# Patient Record
Sex: Male | Born: 1970
Health system: Southern US, Community
[De-identification: ages and names within clinical notes are randomized; demographics above are authoritative.]

## PROBLEM LIST (undated history)

## (undated) DIAGNOSIS — I10 Essential (primary) hypertension: Secondary | ICD-10-CM

## (undated) DIAGNOSIS — G4730 Sleep apnea, unspecified: Secondary | ICD-10-CM

## (undated) DIAGNOSIS — G43909 Migraine, unspecified, not intractable, without status migrainosus: Secondary | ICD-10-CM

## (undated) DIAGNOSIS — Z87442 Personal history of urinary calculi: Secondary | ICD-10-CM

## (undated) DIAGNOSIS — N201 Calculus of ureter: Secondary | ICD-10-CM

## (undated) DIAGNOSIS — N289 Disorder of kidney and ureter, unspecified: Secondary | ICD-10-CM

## (undated) HISTORY — PX: BACK SURGERY: SHX140

---

## 2012-06-16 DIAGNOSIS — Z87442 Personal history of urinary calculi: Secondary | ICD-10-CM | POA: Insufficient documentation

## 2014-02-08 ENCOUNTER — Emergency Department (HOSPITAL_COMMUNITY)
Admission: EM | Admit: 2014-02-08 | Discharge: 2014-02-08 | Disposition: A | Discharge status: Home / Self Care | Payer: 59 | Attending: Emergency Medicine | Admitting: Emergency Medicine

## 2014-02-08 ENCOUNTER — Encounter (HOSPITAL_COMMUNITY): Payer: Self-pay | Admitting: Emergency Medicine

## 2014-02-08 DIAGNOSIS — F17200 Nicotine dependence, unspecified, uncomplicated: Secondary | ICD-10-CM | POA: Insufficient documentation

## 2014-02-08 DIAGNOSIS — Z880 Allergy status to penicillin: Secondary | ICD-10-CM | POA: Insufficient documentation

## 2014-02-08 DIAGNOSIS — G43909 Migraine, unspecified, not intractable, without status migrainosus: Secondary | ICD-10-CM

## 2014-02-08 HISTORY — DX: Migraine, unspecified, not intractable, without status migrainosus: G43.909

## 2014-02-08 MED ORDER — KETOROLAC TROMETHAMINE 10 MG PO TABS: 10.0000 mg | ORAL_TABLET | Freq: Four times a day (QID) | ORAL | Status: DC | PRN

## 2014-02-08 MED ORDER — SODIUM CHLORIDE 0.9 % IV BOLUS (SEPSIS)
1000.0000 mL | Freq: Once | INTRAVENOUS | Status: AC
  Administered 2014-02-08: 1000 mL via INTRAVENOUS

## 2014-02-08 MED ORDER — DIPHENHYDRAMINE HCL 50 MG/ML IJ SOLN
25.0000 mg | Freq: Once | INTRAMUSCULAR | Status: AC
  Administered 2014-02-08: 25 mg via INTRAVENOUS
  Filled 2014-02-08: qty 1

## 2014-02-08 MED ORDER — METOCLOPRAMIDE HCL 10 MG PO TABS: 10.0000 mg | ORAL_TABLET | Freq: Four times a day (QID) | ORAL | Status: DC

## 2014-02-08 MED ORDER — KETOROLAC TROMETHAMINE 30 MG/ML IJ SOLN
30.0000 mg | Freq: Once | INTRAMUSCULAR | Status: AC
  Administered 2014-02-08: 30 mg via INTRAVENOUS
  Filled 2014-02-08: qty 1

## 2014-02-08 MED ORDER — METOCLOPRAMIDE HCL 5 MG/ML IJ SOLN
10.0000 mg | Freq: Once | INTRAMUSCULAR | Status: AC
  Administered 2014-02-08: 10 mg via INTRAVENOUS
  Filled 2014-02-08: qty 2

## 2014-02-08 NOTE — ED Notes (Signed)
Migraine headache starting yesterday with light/sound sensitivity.  Pt crying in triage.

## 2014-02-08 NOTE — ED Provider Notes (Signed)
CSN: [id number]     Arrival date & time 02/08/14  0902 History  This chart was scribed for Nat Christen, MD by Ludger Nutting, ED Scribe. This patient was seen in room APA06/APA06 and the patient's care was started 9:49 AM.    Chief Complaint  Patient presents with  . Migraine    The history is provided by the patient and a significant other. No language interpreter was used.    HPI Comments: John Jones is a 43 y.o. male with past medical history of migraine who presents to the Emergency Department complaining of a constant HA to the occipital region that began last night. Patient has a history of migraine HA's and states this episode is slightly worse than usual. He takes medications for these symptoms but is unable to specify what he takes. No fever, chills, meningeal signs, neuro deficits. Wife reports this is normal for him. Severity is moderate. His normal medication has not helped.   Past Medical History  Diagnosis Date  . Migraine    History reviewed. No pertinent past surgical history. No family history on file. History  Substance Use Topics  . Smoking status: Current Every Day Smoker    Types: Cigarettes  . Smokeless tobacco: Not on file  . Alcohol Use: No     Comment: occ    Review of Systems  A complete 10 system review of systems was obtained and all systems are negative except as noted in the HPI and PMH.    Allergies  Penicillins  Home Medications   Prior to Admission medications   Medication Sig Start Date End Date Taking? Authorizing Provider  ketorolac (TORADOL) 10 MG tablet Take 1 tablet (10 mg total) by mouth every 6 (six) hours as needed. 02/08/14   Nat Christen, MD  metoCLOPramide (REGLAN) 10 MG tablet Take 1 tablet (10 mg total) by mouth every 6 (six) hours. 02/08/14   Nat Christen, MD   BP 147/106  Pulse 88  Temp(Src) 98.3 F (36.8 C) (Oral)  Resp 16  Ht 5\' 5"  (1.651 m)  Wt 140 lb (63.504 kg)  BMI 23.30 kg/m2  SpO2 100% Physical Exam  Nursing  note and vitals reviewed. Constitutional: He is oriented to person, place, and time. He appears well-developed and well-nourished.  HENT:  Head: Normocephalic and atraumatic.  Eyes: Conjunctivae and EOM are normal. Pupils are equal, round, and reactive to light.  Neck: Normal range of motion. Neck supple.  Cardiovascular: Normal rate, regular rhythm and normal heart sounds.   Pulmonary/Chest: Effort normal and breath sounds normal.  Abdominal: Soft. Bowel sounds are normal.  Musculoskeletal: Normal range of motion.  Neurological: He is alert and oriented to person, place, and time.  Skin: Skin is warm and dry.  Psychiatric: He has a normal mood and affect. His behavior is normal.    ED Course  Procedures (including critical care time)  DIAGNOSTIC STUDIES: Oxygen Saturation is 100% on RA, normal by my interpretation.    COORDINATION OF CARE: 9:52 AM Patient was given IV Toradol, benadryl, and Reglan which is gradually improving his symptoms. Discussed treatment plan with pt at bedside and pt agreed to plan.   Labs Review Labs Reviewed - No data to display  Imaging Review No results found.   EKG Interpretation None      MDM   Final diagnoses:  Migraine without status migrainosus, not intractable, unspecified migraine type   Patient feels much better after IV Benadryl, Reglan, Toradol. No neurological deficits or  meningeal signs. Discharge medications Reglan 10 mg and Toradol 30 mg.  Discussed with patient and his wife.  I personally performed the services described in this documentation, which was scribed in my presence. The recorded information has been reviewed and is accurate.   Nat Christen, MD 02/08/14 1055

## 2014-02-08 NOTE — Discharge Instructions (Signed)
Increase fluids.   Prescriptions for Toradol and Reglan given.   Can also take Benadryl for your headache.   Return if worse.

## 2014-06-22 ENCOUNTER — Other Ambulatory Visit (HOSPITAL_COMMUNITY): Payer: Self-pay | Admitting: Orthopedic Surgery

## 2014-06-22 DIAGNOSIS — S63521A Sprain of radiocarpal joint of right wrist, initial encounter: Secondary | ICD-10-CM

## 2014-06-28 ENCOUNTER — Ambulatory Visit (HOSPITAL_COMMUNITY)
Admission: RE | Admit: 2014-06-28 | Discharge: 2014-06-28 | Disposition: A | Discharge status: Home / Self Care | Payer: 59 | Source: Ambulatory Visit | Attending: Orthopedic Surgery | Admitting: Orthopedic Surgery

## 2014-06-28 DIAGNOSIS — M25531 Pain in right wrist: Secondary | ICD-10-CM | POA: Insufficient documentation

## 2014-06-28 DIAGNOSIS — S63521A Sprain of radiocarpal joint of right wrist, initial encounter: Secondary | ICD-10-CM

## 2014-06-28 DIAGNOSIS — R937 Abnormal findings on diagnostic imaging of other parts of musculoskeletal system: Secondary | ICD-10-CM | POA: Diagnosis not present

## 2014-09-29 ENCOUNTER — Encounter (HOSPITAL_BASED_OUTPATIENT_CLINIC_OR_DEPARTMENT_OTHER): Admission: RE | Payer: Self-pay | Source: Ambulatory Visit

## 2014-09-29 ENCOUNTER — Ambulatory Visit (HOSPITAL_BASED_OUTPATIENT_CLINIC_OR_DEPARTMENT_OTHER): Admission: RE | Admit: 2014-09-29 | Payer: 59 | Source: Ambulatory Visit | Admitting: Orthopedic Surgery

## 2014-09-29 SURGERY — CARPAL TUNNEL RELEASE
Anesthesia: General | Site: Wrist | Laterality: Right

## 2014-12-07 ENCOUNTER — Encounter (HOSPITAL_COMMUNITY)
Admission: RE | Admit: 2014-12-07 | Discharge: 2014-12-07 | Disposition: A | Discharge status: Home / Self Care | Payer: 59 | Source: Ambulatory Visit | Attending: Orthopedic Surgery | Admitting: Orthopedic Surgery

## 2014-12-07 ENCOUNTER — Encounter (HOSPITAL_COMMUNITY): Payer: Self-pay

## 2014-12-07 DIAGNOSIS — M25531 Pain in right wrist: Secondary | ICD-10-CM | POA: Diagnosis present

## 2014-12-07 DIAGNOSIS — F419 Anxiety disorder, unspecified: Secondary | ICD-10-CM | POA: Diagnosis not present

## 2014-12-07 DIAGNOSIS — X58XXXA Exposure to other specified factors, initial encounter: Secondary | ICD-10-CM | POA: Diagnosis not present

## 2014-12-07 DIAGNOSIS — F329 Major depressive disorder, single episode, unspecified: Secondary | ICD-10-CM | POA: Diagnosis not present

## 2014-12-07 DIAGNOSIS — G5601 Carpal tunnel syndrome, right upper limb: Secondary | ICD-10-CM | POA: Diagnosis not present

## 2014-12-07 DIAGNOSIS — S63511A Sprain of carpal joint of right wrist, initial encounter: Secondary | ICD-10-CM | POA: Diagnosis not present

## 2014-12-07 DIAGNOSIS — F17210 Nicotine dependence, cigarettes, uncomplicated: Secondary | ICD-10-CM | POA: Diagnosis not present

## 2014-12-07 LAB — CBC
HEMATOCRIT: 48.5 % (ref 39.0–52.0)
HEMOGLOBIN: 16.7 g/dL (ref 13.0–17.0)
MCH: 32.6 pg (ref 26.0–34.0)
MCHC: 34.4 g/dL (ref 30.0–36.0)
MCV: 94.5 fL (ref 78.0–100.0)
Platelets: 215 10*3/uL (ref 150–400)
RBC: 5.13 MIL/uL (ref 4.22–5.81)
RDW: 13.6 % (ref 11.5–15.5)
WBC: 10.4 10*3/uL (ref 4.0–10.5)

## 2014-12-07 LAB — BASIC METABOLIC PANEL
ANION GAP: 8 (ref 5–15)
BUN: 12 mg/dL (ref 6–20)
CALCIUM: 9.1 mg/dL (ref 8.9–10.3)
CO2: 26 mmol/L (ref 22–32)
Chloride: 106 mmol/L (ref 101–111)
Creatinine, Ser: 1.13 mg/dL (ref 0.61–1.24)
Glucose, Bld: 93 mg/dL (ref 65–99)
Potassium: 4.1 mmol/L (ref 3.5–5.1)
SODIUM: 140 mmol/L (ref 135–145)

## 2014-12-07 MED ORDER — CLINDAMYCIN PHOSPHATE 900 MG/50ML IV SOLN
900.0000 mg | INTRAVENOUS | Status: AC
  Administered 2014-12-08: 900 mg via INTRAVENOUS
  Filled 2014-12-07 (×2): qty 50

## 2014-12-07 NOTE — H&P (Signed)
Ewing Fandino Narez is an 44 y.o. male.   Chief Complaint: RIGHT WRIST PAIN HPI: PT FOLLOWED IN OFFICE PT WITH PERSISTENT RIGHT WRIST PAIN PT HERE FOR SURGERY ON RIGHT WRIST NO PRIOR SURGERY TO RIGHT WRIST  Past Medical History  Diagnosis Date  . Migraine   . Depression   . Anxiety   . Chronic kidney disease     stones    No past surgical history on file.  No family history on file. Social History:  reports that he has been smoking Cigarettes.  He has a 8 pack-year smoking history. He does not have any smokeless tobacco history on file. He reports that he drinks alcohol. He reports that he does not use illicit drugs.  Allergies:  Allergies  Allergen Reactions  . Penicillins     No prescriptions prior to admission    Results for orders placed or performed during the hospital encounter of 12/07/14 (from the past 48 hour(s))  CBC     Status: None   Collection Time: 12/07/14 11:21 AM  Result Value Ref Range   WBC 10.4 4.0 - 10.5 K/uL   RBC 5.13 4.22 - 5.81 MIL/uL   Hemoglobin 16.7 13.0 - 17.0 g/dL   HCT 48.5 39.0 - 52.0 %   MCV 94.5 78.0 - 100.0 fL   MCH 32.6 26.0 - 34.0 pg   MCHC 34.4 30.0 - 36.0 g/dL   RDW 13.6 11.5 - 15.5 %   Platelets 215 150 - 400 K/uL  Basic metabolic panel     Status: None   Collection Time: 12/07/14 11:21 AM  Result Value Ref Range   Sodium 140 135 - 145 mmol/L   Potassium 4.1 3.5 - 5.1 mmol/L   Chloride 106 101 - 111 mmol/L   CO2 26 22 - 32 mmol/L   Glucose, Bld 93 65 - 99 mg/dL   BUN 12 6 - 20 mg/dL   Creatinine, Ser 1.13 0.61 - 1.24 mg/dL   Calcium 9.1 8.9 - 10.3 mg/dL   GFR calc non Af Amer >60 >60 mL/min   GFR calc Af Amer >60 >60 mL/min    Comment: (NOTE) The eGFR has been calculated using the CKD EPI equation. This calculation has not been validated in all clinical situations. eGFR's persistently <60 mL/min signify possible Chronic Kidney Disease.    Anion gap 8 5 - 15   No results found.  ROS NO RECENT ILLNESSES OR  HOSPITALIZATIONS  There were no vitals taken for this visit. Physical Exam   General Appearance:  Alert, cooperative, no distress, appears stated age  Head:  Normocephalic, without obvious abnormality, atraumatic  Eyes:  Pupils equal, conjunctiva/corneas clear,         Throat: Lips, mucosa, and tongue normal; teeth and gums normal  Neck: No visible masses     Lungs:   respirations unlabored  Chest Wall:  No tenderness or deformity  Heart:  Regular rate and rhythm,  Abdomen:   Soft, non-tender,         Extremities: RIGHT HAND: SKIN INTACT GOOD THUMB AND DIGITAL MOBILITY NVI RIGHT HAND ABLE TO PALMAR ABDUCT THUMB AND DIGITS  Pulses: 2+ and symmetric  Skin: Skin color, texture, turgor normal, no rashes or lesions     Neurologic: Normal    Assessment/Plan RIGHT HAND CARPAL TUNNEL RIGHT WRIST ULNAR SIDED WRIST PAIN  RIGHT HAND CARPAL TUNNEL RELEASE RIGHT WRIST ARTHROSCOPY AND JOINT DEBRIDEMENT AND REPAIR AS INDICATED  R/B/A DISCUSSED WITH PT IN OFFICE.  PT  VOICED UNDERSTANDING OF PLAN CONSENT SIGNED DAY OF SURGERY PT SEEN AND EXAMINED PRIOR TO OPERATIVE PROCEDURE/DAY OF SURGERY SITE MARKED. QUESTIONS ANSWERED WILL GO HOME FOLLOWING SURGERY  WE ARE PLANNING SURGERY FOR YOUR UPPER EXTREMITY. THE RISKS AND BENEFITS OF SURGERY INCLUDE BUT NOT LIMITED TO BLEEDING INFECTION, DAMAGE TO NEARBY NERVES ARTERIES TENDONS, FAILURE OF SURGERY TO ACCOMPLISH ITS INTENDED GOALS, PERSISTENT SYMPTOMS AND NEED FOR FURTHER SURGICAL INTERVENTION. WITH THIS IN MIND WE WILL PROCEED. I HAVE DISCUSSED WITH THE PATIENT THE PRE AND POSTOPERATIVE REGIMEN AND THE DOS AND DON'TS. PT VOICED UNDERSTANDING AND INFORMED CONSENT SIGNED.  Linna Hoff 12/08/2014 at 1611

## 2014-12-07 NOTE — Pre-Procedure Instructions (Signed)
    John Jones  12/07/2014      [facility] - [place], [place], [place] - [place] [address] Phone: [phone] Fax: [phone]    Your procedure is scheduled on 12/08/14.  Report to [facility] Admitting at to call  A.M.of surgery  Call this number if you have problems the morning of surgery:  [phone]   Remember:  Do not eat food or drink liquids after midnight.  Take these medicines the morning of surgery with A SIP OF WATER- cymbalta   Do not wear jewelry, make-up or nail polish.  Do not wear lotions, powders, or perfumes.  You may wear deodorant.  Do not shave 48 hours prior to surgery.  Men may shave face and neck.  Do not bring valuables to the hospital.  [facility] is not responsible for any belongings or valuables.  Contacts, dentures or bridgework may not be worn into surgery.  Leave your suitcase in the car.  After surgery it may be brought to your room.  For patients admitted to the hospital, discharge time will be determined by your treatment team.  Patients discharged the day of surgery will not be allowed to drive home.   Name and phone number of your driver:    Special instructions:    Please read over the following fact sheets that you were given. Pain Booklet, Coughing and Deep Breathing and Surgical Site Infection Prevention

## 2014-12-07 NOTE — Brief Op Note (Signed)
12/08/2014  6:36 PM  PATIENT:  John Jones  44 y.o. male  PRE-OPERATIVE DIAGNOSIS:  RIGHT CARPAL TUNNEL/RIGHT RADIAL CARPAL LIGAMENT SPRAIN  POST-OPERATIVE DIAGNOSIS:  * No post-op diagnosis entered *  PROCEDURE:  Procedure(s): RIGHT CARPAL TUNNEL RELEASE (Right) RIGHT WRIST ARTHROSCOPY WITH DEBRIDEMENT/OR REPAIR (Right)  SURGEON:  Surgeon(s) and Role:    * Iran Planas, MD - Primary  PHYSICIAN ASSISTANT:   ASSISTANTS: none   ANESTHESIA:   general  EBL:     BLOOD ADMINISTERED:none  DRAINS: none   LOCAL MEDICATIONS USED:  MARCAINE     SPECIMEN:  No Specimen  DISPOSITION OF SPECIMEN:  N/A  COUNTS:  YES  TOURNIQUET:  * No tourniquets in log *  DICTATION: .[id number]  PLAN OF CARE: Discharge to home after PACU  PATIENT DISPOSITION:  PACU - hemodynamically stable.   Delay start of Pharmacological VTE agent (>24hrs) due to surgical blood loss or risk of bleeding: not applicable

## 2014-12-08 ENCOUNTER — Ambulatory Visit (HOSPITAL_BASED_OUTPATIENT_CLINIC_OR_DEPARTMENT_OTHER)
Admission: RE | Admit: 2014-12-08 | Discharge: 2014-12-08 | Disposition: A | Discharge status: Home / Self Care | Payer: 59 | Source: Ambulatory Visit | Attending: Orthopedic Surgery | Admitting: Orthopedic Surgery

## 2014-12-08 ENCOUNTER — Ambulatory Visit (HOSPITAL_COMMUNITY): Payer: 59 | Admitting: Certified Registered"

## 2014-12-08 ENCOUNTER — Encounter (HOSPITAL_COMMUNITY): Payer: Self-pay | Admitting: *Deleted

## 2014-12-08 ENCOUNTER — Encounter (HOSPITAL_COMMUNITY)
Admission: RE | Disposition: A | Discharge status: Home / Self Care | Payer: Self-pay | Source: Ambulatory Visit | Attending: Orthopedic Surgery

## 2014-12-08 DIAGNOSIS — X58XXXA Exposure to other specified factors, initial encounter: Secondary | ICD-10-CM | POA: Insufficient documentation

## 2014-12-08 DIAGNOSIS — S63511A Sprain of carpal joint of right wrist, initial encounter: Secondary | ICD-10-CM | POA: Diagnosis not present

## 2014-12-08 DIAGNOSIS — F329 Major depressive disorder, single episode, unspecified: Secondary | ICD-10-CM | POA: Insufficient documentation

## 2014-12-08 DIAGNOSIS — G5601 Carpal tunnel syndrome, right upper limb: Secondary | ICD-10-CM | POA: Insufficient documentation

## 2014-12-08 DIAGNOSIS — F17210 Nicotine dependence, cigarettes, uncomplicated: Secondary | ICD-10-CM | POA: Insufficient documentation

## 2014-12-08 DIAGNOSIS — F419 Anxiety disorder, unspecified: Secondary | ICD-10-CM | POA: Insufficient documentation

## 2014-12-08 HISTORY — PX: WRIST ARTHROSCOPY: SHX838

## 2014-12-08 HISTORY — PX: CARPAL TUNNEL RELEASE: SHX101

## 2014-12-08 SURGERY — CARPAL TUNNEL RELEASE
Anesthesia: General | Site: Wrist | Laterality: Right

## 2014-12-08 MED ORDER — FENTANYL CITRATE (PF) 100 MCG/2ML IJ SOLN
INTRAMUSCULAR | Status: DC
Start: 2014-12-08 — End: 2014-12-08
  Filled 2014-12-08: qty 2

## 2014-12-08 MED ORDER — ONDANSETRON HCL 4 MG/2ML IJ SOLN
INTRAMUSCULAR | Status: AC
  Filled 2014-12-08: qty 2

## 2014-12-08 MED ORDER — KETOROLAC TROMETHAMINE 30 MG/ML IJ SOLN
INTRAMUSCULAR | Status: AC
  Administered 2014-12-08: 30 mg via INTRAVENOUS
  Filled 2014-12-08: qty 1

## 2014-12-08 MED ORDER — BUPIVACAINE HCL (PF) 0.25 % IJ SOLN
INTRAMUSCULAR | Status: DC | PRN
  Administered 2014-12-08: 10 mL

## 2014-12-08 MED ORDER — OXYCODONE-ACETAMINOPHEN 5-325 MG PO TABS
ORAL_TABLET | ORAL | Status: DC
Start: 2014-12-08 — End: 2014-12-09
  Filled 2014-12-08: qty 1

## 2014-12-08 MED ORDER — ONDANSETRON HCL 4 MG/2ML IJ SOLN
INTRAMUSCULAR | Status: DC | PRN
  Administered 2014-12-08: 4 mg via INTRAVENOUS

## 2014-12-08 MED ORDER — MIDAZOLAM HCL 5 MG/5ML IJ SOLN
INTRAMUSCULAR | Status: DC | PRN
  Administered 2014-12-08: 2 mg via INTRAVENOUS

## 2014-12-08 MED ORDER — FENTANYL CITRATE (PF) 250 MCG/5ML IJ SOLN
INTRAMUSCULAR | Status: AC
  Filled 2014-12-08: qty 5

## 2014-12-08 MED ORDER — FENTANYL CITRATE (PF) 100 MCG/2ML IJ SOLN: 25.0000 ug | INTRAMUSCULAR | Status: DC | PRN

## 2014-12-08 MED ORDER — KETOROLAC TROMETHAMINE 30 MG/ML IJ SOLN
30.0000 mg | Freq: Once | INTRAMUSCULAR | Status: AC | PRN
  Administered 2014-12-08: 30 mg via INTRAVENOUS

## 2014-12-08 MED ORDER — LIDOCAINE HCL (CARDIAC) 20 MG/ML IV SOLN
INTRAVENOUS | Status: DC | PRN
  Administered 2014-12-08: 100 mg via INTRAVENOUS

## 2014-12-08 MED ORDER — ONDANSETRON HCL 4 MG/2ML IJ SOLN
INTRAMUSCULAR | Status: AC
  Administered 2014-12-08: 4 mg
  Filled 2014-12-08: qty 2

## 2014-12-08 MED ORDER — LACTATED RINGERS IV SOLN
INTRAVENOUS | Status: DC
  Administered 2014-12-08 (×2): via INTRAVENOUS

## 2014-12-08 MED ORDER — HYDROMORPHONE HCL 1 MG/ML IJ SOLN
0.5000 mg | INTRAMUSCULAR | Status: AC | PRN
  Administered 2014-12-08 (×4): 0.5 mg via INTRAVENOUS

## 2014-12-08 MED ORDER — HYDROMORPHONE HCL 1 MG/ML IJ SOLN
INTRAMUSCULAR | Status: AC
  Administered 2014-12-08: 0.5 mg via INTRAVENOUS
  Filled 2014-12-08: qty 1

## 2014-12-08 MED ORDER — OXYCODONE-ACETAMINOPHEN 5-325 MG PO TABS
ORAL_TABLET | ORAL | Status: AC
  Filled 2014-12-08: qty 1

## 2014-12-08 MED ORDER — LIDOCAINE HCL (PF) 1 % IJ SOLN
INTRAMUSCULAR | Status: AC
  Filled 2014-12-08: qty 30

## 2014-12-08 MED ORDER — PROPOFOL 10 MG/ML IV BOLUS
INTRAVENOUS | Status: AC
  Filled 2014-12-08: qty 20

## 2014-12-08 MED ORDER — PROMETHAZINE HCL 25 MG/ML IJ SOLN: 6.2500 mg | INTRAMUSCULAR | Status: DC | PRN

## 2014-12-08 MED ORDER — BUPIVACAINE HCL (PF) 0.25 % IJ SOLN
INTRAMUSCULAR | Status: AC
  Filled 2014-12-08: qty 30

## 2014-12-08 MED ORDER — OXYCODONE-ACETAMINOPHEN 5-325 MG PO TABS
1.0000 | ORAL_TABLET | Freq: Once | ORAL | Status: AC
  Administered 2014-12-08: 1 via ORAL

## 2014-12-08 MED ORDER — MIDAZOLAM HCL 2 MG/2ML IJ SOLN
INTRAMUSCULAR | Status: AC
  Filled 2014-12-08: qty 2

## 2014-12-08 MED ORDER — HYDROMORPHONE HCL 1 MG/ML IJ SOLN
INTRAMUSCULAR | Status: AC
  Filled 2014-12-08: qty 1

## 2014-12-08 MED ORDER — OXYCODONE-ACETAMINOPHEN 5-325 MG PO TABS: 1.0000 | ORAL_TABLET | ORAL | Status: DC | PRN

## 2014-12-08 MED ORDER — DOCUSATE SODIUM 100 MG PO CAPS: 100.0000 mg | ORAL_CAPSULE | Freq: Two times a day (BID) | ORAL | Status: DC

## 2014-12-08 MED ORDER — SODIUM CHLORIDE 0.9 % IR SOLN
Status: DC | PRN
  Administered 2014-12-08 (×3): 1000 mL

## 2014-12-08 MED ORDER — PROPOFOL 10 MG/ML IV BOLUS
INTRAVENOUS | Status: DC | PRN
Start: 2014-12-08 — End: 2014-12-08
  Administered 2014-12-08: 100 mg via INTRAVENOUS
  Administered 2014-12-08: 200 mg via INTRAVENOUS

## 2014-12-08 MED ORDER — CHLORHEXIDINE GLUCONATE 4 % EX LIQD: 60.0000 mL | Freq: Once | CUTANEOUS | Status: DC

## 2014-12-08 MED ORDER — FENTANYL CITRATE (PF) 100 MCG/2ML IJ SOLN
INTRAMUSCULAR | Status: DC | PRN
  Administered 2014-12-08: 50 ug via INTRAVENOUS
  Administered 2014-12-08: 100 ug via INTRAVENOUS
  Administered 2014-12-08 (×2): 50 ug via INTRAVENOUS

## 2014-12-08 SURGICAL SUPPLY — 56 items
BANDAGE ELASTIC 3 VELCRO ST LF (GAUZE/BANDAGES/DRESSINGS) ×2 IMPLANT
BANDAGE ELASTIC 4 VELCRO ST LF (GAUZE/BANDAGES/DRESSINGS) ×2 IMPLANT
BNDG COHESIVE 3X5 TAN STRL LF (GAUZE/BANDAGES/DRESSINGS) ×2 IMPLANT
BNDG ESMARK 4X9 LF (GAUZE/BANDAGES/DRESSINGS) ×2 IMPLANT
BNDG GAUZE ELAST 4 BULKY (GAUZE/BANDAGES/DRESSINGS) ×2 IMPLANT
BRUSH SCRUB EZ PLAIN DRY (MISCELLANEOUS) ×2 IMPLANT
BUR GATOR 2.9 (BURR) IMPLANT
CORDS BIPOLAR (ELECTRODE) ×2 IMPLANT
COVER SURGICAL LIGHT HANDLE (MISCELLANEOUS) ×2 IMPLANT
CUFF TOURNIQUET SINGLE 18IN (TOURNIQUET CUFF) ×2 IMPLANT
CUFF TOURNIQUET SINGLE 24IN (TOURNIQUET CUFF) IMPLANT
DRAPE SURG 17X23 STRL (DRAPES) ×2 IMPLANT
DRSG ADAPTIC 3X8 NADH LF (GAUZE/BANDAGES/DRESSINGS) ×2 IMPLANT
EVACUATOR 1/8 PVC DRAIN (DRAIN) IMPLANT
GAUZE SPONGE 4X4 12PLY STRL (GAUZE/BANDAGES/DRESSINGS) ×2 IMPLANT
GAUZE XEROFORM 1X8 LF (GAUZE/BANDAGES/DRESSINGS) ×2 IMPLANT
GLOVE BIOGEL PI IND STRL 8.5 (GLOVE) ×1 IMPLANT
GLOVE BIOGEL PI INDICATOR 8.5 (GLOVE) ×1
GLOVE SURG ORTHO 8.0 STRL STRW (GLOVE) ×2 IMPLANT
GOWN STRL REUS W/ TWL LRG LVL3 (GOWN DISPOSABLE) ×2 IMPLANT
GOWN STRL REUS W/ TWL XL LVL3 (GOWN DISPOSABLE) ×1 IMPLANT
GOWN STRL REUS W/TWL LRG LVL3 (GOWN DISPOSABLE) ×2
GOWN STRL REUS W/TWL XL LVL3 (GOWN DISPOSABLE) ×1
KIT BASIN OR (CUSTOM PROCEDURE TRAY) ×2 IMPLANT
KIT ROOM TURNOVER OR (KITS) ×2 IMPLANT
LOOP VESSEL MAXI BLUE (MISCELLANEOUS) IMPLANT
MANIFOLD NEPTUNE II (INSTRUMENTS) ×2 IMPLANT
NEEDLE HYPO 21X1.5 SAFETY (NEEDLE) ×2 IMPLANT
NEEDLE HYPO 25GX1X1/2 BEV (NEEDLE) ×2 IMPLANT
NS IRRIG 1000ML POUR BTL (IV SOLUTION) ×2 IMPLANT
PACK ORTHO EXTREMITY (CUSTOM PROCEDURE TRAY) ×2 IMPLANT
PAD ARMBOARD 7.5X6 YLW CONV (MISCELLANEOUS) ×4 IMPLANT
PAD CAST 4YDX4 CTTN HI CHSV (CAST SUPPLIES) ×2 IMPLANT
PADDING CAST ABS 4INX4YD NS (CAST SUPPLIES) ×1
PADDING CAST ABS COTTON 4X4 ST (CAST SUPPLIES) ×1 IMPLANT
PADDING CAST COTTON 4X4 STRL (CAST SUPPLIES) ×2
SET SM JOINT TUBING/CANN (CANNULA) ×2 IMPLANT
SOAP 2 % CHG 4 OZ (WOUND CARE) ×2 IMPLANT
SPLINT FIBERGLASS 3X35 (CAST SUPPLIES) ×2 IMPLANT
SUCTION FRAZIER TIP 10 FR DISP (SUCTIONS) ×2 IMPLANT
SUT ETHILON 4 0 PS 2 18 (SUTURE) IMPLANT
SUT PROLENE 4 0 PS 2 18 (SUTURE) ×2 IMPLANT
SUT PROLENE 5 0 PS 2 (SUTURE) ×2 IMPLANT
SUT VIC AB 2-0 CT1 27 (SUTURE)
SUT VIC AB 2-0 CT1 TAPERPNT 27 (SUTURE) IMPLANT
SUT VIC AB 3-0 FS2 27 (SUTURE) IMPLANT
SYR CONTROL 10ML LL (SYRINGE) ×2 IMPLANT
SYSTEM CHEST DRAIN TLS 7FR (DRAIN) IMPLANT
TOWEL OR 17X24 6PK STRL BLUE (TOWEL DISPOSABLE) ×2 IMPLANT
TOWEL OR 17X26 10 PK STRL BLUE (TOWEL DISPOSABLE) ×2 IMPLANT
TRAP DIGIT (INSTRUMENTS) ×2 IMPLANT
TUBE CONNECTING 12X1/4 (SUCTIONS) ×2 IMPLANT
TUBING CYSTO DISP (UROLOGICAL SUPPLIES) ×2 IMPLANT
UNDERPAD 30X30 INCONTINENT (UNDERPADS AND DIAPERS) ×2 IMPLANT
WAND SHORT BEVEL W/CORD (SURGICAL WAND) ×2 IMPLANT
WATER STERILE IRR 1000ML POUR (IV SOLUTION) ×2 IMPLANT

## 2014-12-08 NOTE — Discharge Instructions (Signed)
KEEP BANDAGE CLEAN AND DRY °CALL OFFICE FOR F/U APPT [phone] IN 14 DAYS °DR Robb Sibal CELL [phone] °KEEP HAND ELEVATED ABOVE HEART °OK TO APPLY ICE TO OPERATIVE AREA °CONTACT OFFICE IF ANY WORSENING PAIN OR CONCERNS. °

## 2014-12-08 NOTE — Anesthesia Preprocedure Evaluation (Signed)
Anesthesia Evaluation  Patient identified by MRN, date of birth, ID band Patient awake    Reviewed: Allergy & Precautions, NPO status , Patient's Chart, lab work & pertinent test results  Airway Mallampati: II  TM Distance: >3 FB Neck ROM: Full    Dental no notable dental hx.    Pulmonary Current Smoker,  breath sounds clear to auscultation  Pulmonary exam normal       Cardiovascular negative cardio ROS Normal cardiovascular examRhythm:Regular Rate:Normal     Neuro/Psych Depression negative neurological ROS     GI/Hepatic negative GI ROS, Neg liver ROS,   Endo/Other  negative endocrine ROS  Renal/GU negative Renal ROS  negative genitourinary   Musculoskeletal negative musculoskeletal ROS (+)   Abdominal   Peds negative pediatric ROS (+)  Hematology negative hematology ROS (+)   Anesthesia Other Findings   Reproductive/Obstetrics negative OB ROS                             Anesthesia Physical Anesthesia Plan  ASA: II  Anesthesia Plan: General   Post-op Pain Management:    Induction: Intravenous  Airway Management Planned: LMA  Additional Equipment:   Intra-op Plan:   Post-operative Plan: Extubation in OR  Informed Consent: I have reviewed the patients History and Physical, chart, labs and discussed the procedure including the risks, benefits and alternatives for the proposed anesthesia with the patient or authorized representative who has indicated his/her understanding and acceptance.   Dental advisory given  Plan Discussed with: CRNA and Surgeon  Anesthesia Plan Comments:         Anesthesia Quick Evaluation

## 2014-12-08 NOTE — Anesthesia Postprocedure Evaluation (Signed)
  Anesthesia Post-op Note  Patient: John Jones  Procedure(s) Performed: Procedure(s): RIGHT CARPAL TUNNEL RELEASE (Right) RIGHT WRIST ARTHROSCOPY WITH DEBRIDEMENT/OR REPAIR (Right)  Patient Location: PACU  Anesthesia Type:General  Level of Consciousness: awake, alert  and oriented  Airway and Oxygen Therapy: Patient Spontanous Breathing and Patient connected to nasal cannula oxygen  Post-op Pain: mild  Post-op Assessment: Post-op Vital signs reviewed, Patient's Cardiovascular Status Stable, Respiratory Function Stable, Patent Airway and Pain level controlled              Post-op Vital Signs: stable  Last Vitals:  Filed Vitals:   12/08/14 2030  BP: 157/112  Pulse: 97  Temp:   Resp: 19    Complications: No apparent anesthesia complications

## 2014-12-08 NOTE — Anesthesia Procedure Notes (Signed)
Procedure Name: Intubation Date/Time: 12/08/2014 5:54 PM Performed by: Melina Copa, Shanaya Schneck R Pre-anesthesia Checklist: Patient identified, Emergency Drugs available, Suction available, Patient being monitored and Timeout performed Patient Re-evaluated:Patient Re-evaluated prior to inductionOxygen Delivery Method: Circle system utilized Preoxygenation: Pre-oxygenation with 100% oxygen Intubation Type: IV induction Ventilation: Mask ventilation without difficulty LMA: LMA inserted LMA Size: 4.0 Number of attempts: 1 Placement Confirmation: positive ETCO2 and breath sounds checked- equal and bilateral Tube secured with: Tape Dental Injury: Teeth and Oropharynx as per pre-operative assessment

## 2014-12-08 NOTE — Transfer of Care (Signed)
Immediate Anesthesia Transfer of Care Note  Patient: John Jones  Procedure(s) Performed: Procedure(s): RIGHT CARPAL TUNNEL RELEASE (Right) RIGHT WRIST ARTHROSCOPY WITH DEBRIDEMENT/OR REPAIR (Right)  Patient Location: PACU  Anesthesia Type:General  Level of Consciousness: awake, alert  and oriented  Airway & Oxygen Therapy: Patient Spontanous Breathing and Patient connected to nasal cannula oxygen  Post-op Assessment: Report given to RN and Post -op Vital signs reviewed and stable  Post vital signs: Reviewed and stable  Last Vitals:  Filed Vitals:   12/08/14 1539  BP: 169/107  Pulse: 82  Temp: 36.7 C  Resp: 18    Complications: No apparent anesthesia complications

## 2014-12-09 ENCOUNTER — Encounter (HOSPITAL_COMMUNITY): Payer: Self-pay | Admitting: Orthopedic Surgery

## 2014-12-09 NOTE — Op Note (Signed)
NAME:  John Jones, John Jones NO.:  [id number]  MEDICAL RECORD NO.:  [id number]  LOCATION:  [facility]                         FACILITY:  [facility]  PHYSICIAN:  Linna Hoff IV, M.D.DATE OF BIRTH:  February 02, 1971  DATE OF PROCEDURE:  12/08/2014 DATE OF DISCHARGE:  12/08/2014                              OPERATIVE REPORT   PREOPERATIVE DIAGNOSES: 1. Right hand carpal tunnel syndrome. 2. Right wrist, dorsal wrist pain.  POSTOPERATIVE DIAGNOSES: 1. Right hand carpal tunnel syndrome. 2. Right wrist scapholunate ligament tear.  ATTENDING PHYSICIAN:  Linna Hoff, M.D., who scrubbed and present for the entire procedure.  ASSISTANT SURGEON:  None.  ANESTHESIA:  General via LMA.  SURGICAL PROCEDURE: 1. Right hand carpal tunnel release. 2. Right wrist diagnostic arthroscopy with debridement for SL ligament     instability.  INTRAOPERATIVE FINDINGS:  The patient did have the grade II, impinging upon grade III tear to the SL ligament.  I was able to pass the probe volarly and almost into the midcarpal joint.  The patient still had an incomplete tear of the SL ligament.  The volar band disrupted to the dorsal band was still in continuity.  I could not pass the arthroscope into the midcarpal joint.  SURGICAL INDICATIONS:  Mr. Hinch is a right-hand-dominant gentleman with persistent pain in his wrist.  The patient failed nonsurgical treatment and elected to undergo the above procedures.  Risks, benefits, and alternatives were discussed in detail with the patient.  A signed informed consent was obtained.  Risks include, but not limited to bleeding, infection, damage to nearby nerves, arteries or tendons; loss of motion of the wrist and digits, incomplete relief of symptoms, and need for further surgical intervention.  DESCRIPTION OF PROCEDURE:  The patient was properly identified in the preoperative holding area and marked with a permanent marker made on the right wrist to  indicate the correct operative site.  The patient was then brought back to the operating room, placed supine on the anesthesia room table, where the general anesthesia was administered.  The patient tolerated this well.  A well-padded tourniquet was then placed in a right brachium and sealed with 1000 drape.  Right upper extremity was then prepped and draped in normal sterile fashion.  Time-out was called, the correct site was identified, and procedure then begun.  Attention was then turned to the right wrist.  The limb was then elevated using Esmarch exsanguination and tourniquet insufflated.  Several centimeter incision made directly in the mid palm.  Dissection was then carried down through the skin and subcutaneous tissue.  The palmar fascia was incised longitudinally, exposing the transverse carpal ligament.  Under direct visualization, the ulnar border of the transverse carpal ligament, this was then released with a 15 blade distally.  Further exposure was then carried out proximally to the remaining portions of the transverse carpal ligament as well as portion of the antebrachial fascia was then released.  The wound was then irrigated.  The skin was then closed using contents of the carpal canal and inspected.  No other abnormalities noted.  Skin was then closed using horizontal mattress and Prolene sutures.  Attention was then turned to the right wrist and arthroscopy.  The wrist was then placed on the wrist arthroscopy tower. Approximately, 5 - 10 pounds countertraction was then applied. Anatomical portal sites were then marked out and the joint was insufflated with 10 mL of saline.  There was a leakage of the saline throughout the midcarpal joint.  The 3-4 working portal was then established and a blunt dissection carried down into the joint, where the instrumentation was introduced into the wrist.  Right diagnostic wrist arthroscopy was then carried out.  The patient has  the radioscaphoid capitate and a radioscaphoid articulation looked good. This was free of any arthrosis.  The short long radiolunate ligaments were in continuity.  The patient did have disruption of the volar band of the SL ligament.  I did have the dorsal band still in continuity. Further exposure was then carried out.  Ulnarly, a 60 working portal was then established.  Arthroscopic probe was then introduced and then probing of the TFCC did not reveal any tears, ulnarly and dorsally and volarly including the peripheral margins.  Once this was carried out, the instrumentation was then reversed, inspection, and the LT ligament revealed some mild fraying, but the LT ligament was noted to be in continuity.  The probe was then placed and direct inspection was then done on the SL ligament and thorough inspection done on the SL ligament, and I was not able to place the probe up into the midcarpal joint.  The patient did have the widening of the SL interval, again with the dorsal band in continuity.  I debrided the small suction.  [place] was then brought in to remove some of the degenerative tearing of the volar band of the SL ligament.  The ArthroCare wand was then used for thermal or thermal shrinkage, using it in the coag setting and the lowest setting. After this was done, attention was then turned to the midcarpal joint. The 2 mid carpal portals were then placed and dorsally inspection of the midcarpal joint was done again and I am going to place the probe into the radiocarpal joint.  I did review some mild diastasis through the midcarpal joint.  LT ligament was noted to be in continuity.  No other abnormalities noted within the midcarpal joint.  Instrumentation was then removed.  Once again, a final inspection of the radiocarpal joint was then carried out.  The thorough lavage of the joint was then carried out.  The portal sites were then closed with Prolene 10 mL of 0.25% Marcaine  infiltrated locally between the 2 incisions.  Dorsally and volarly.  Xeroform dressing, sterile compressive bandage was applied. The patient tolerated the procedure well, was placed in a short-arm volar splint, extubated, and taken to recovery room in good condition.  POSTOPERATIVE PLAN:  The patient will be discharged to home, seen back in the office in approximately 2 weeks for wound check, volarly and dorsally.  Application of a short-arm cast.  Cast immobilization for a total of 4 weeks.  Then we will begin a regimen, likely therapy, try to get back some gentle mobility.  PROGNOSIS:  Guarded.  The patient does have the partial SL ligament tear, these are very challenging injuries to treat.  The patient will likely have some persistent dorsal wrist pain with extension.  Based on the intraoperative findings, we would not recommend reconstruction of the SL ligament with the tendon graft, but the patient may still have some persistent pain in the wrist from the partial SL ligament tear.     Josph Macho  Christain Sacramento, M.D.     FWO/MEDQ  D:  12/08/2014  T:  12/09/2014  Job:  [id number]

## 2014-12-13 NOTE — Addendum Note (Signed)
Addendum  created 12/13/14 1934 by Myrtie Soman, MD   Modules edited: Anesthesia Responsible Staff

## 2015-12-02 HISTORY — PX: CYSTOSCOPY: SUR368

## 2016-01-18 ENCOUNTER — Encounter: Payer: Self-pay | Admitting: Orthopedic Surgery

## 2016-01-18 ENCOUNTER — Ambulatory Visit: Payer: Commercial Managed Care - HMO

## 2016-01-18 ENCOUNTER — Ambulatory Visit (INDEPENDENT_AMBULATORY_CARE_PROVIDER_SITE_OTHER): Payer: Commercial Managed Care - HMO | Admitting: Orthopedic Surgery

## 2016-01-18 ENCOUNTER — Ambulatory Visit (INDEPENDENT_AMBULATORY_CARE_PROVIDER_SITE_OTHER): Payer: Commercial Managed Care - HMO

## 2016-01-18 VITALS — BP 135/84 | HR 112 | Ht 65.0 in | Wt 176.0 lb

## 2016-01-18 DIAGNOSIS — M79644 Pain in right finger(s): Secondary | ICD-10-CM

## 2016-01-18 DIAGNOSIS — L02511 Cutaneous abscess of right hand: Secondary | ICD-10-CM

## 2016-01-18 MED ORDER — OXYCODONE-ACETAMINOPHEN 5-325 MG PO TABS: 1.0000 | ORAL_TABLET | ORAL | 0 refills | Status: DC | PRN

## 2016-01-18 NOTE — Patient Instructions (Signed)
SURGERY TOMORROW 8/18  OUT OF WORK 14 DAYS STARTING TOMORROW

## 2016-01-18 NOTE — Progress Notes (Signed)
Chief Complaint  Patient presents with  . 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 He thinks he may have gotten some metal shavings in the long finger.  Today complaining of constant sharp throbbing burning stabbing aching pain and swelling over the middle phalanx with slight decreased range of motion but no fusiform swelling. He took Advil, Aleve and ibuprofen and it didn't help  Review of Systems  Constitutional: Negative.   Musculoskeletal: Positive for back pain, joint pain and myalgias.  Endo/Heme/Allergies: Positive for environmental allergies.  All other systems reviewed and are negative.   Past Medical History:  Diagnosis Date  . Anxiety   . Chronic kidney disease    stones  . Depression   . Migraine     Past Surgical History:  Procedure Laterality Date  . CARPAL TUNNEL RELEASE Right 12/08/2014   Procedure: RIGHT CARPAL TUNNEL RELEASE;  Surgeon: Iran Planas, MD;  Location: [place];  Service: Orthopedics;  Laterality: Right;  . WRIST ARTHROSCOPY Right 12/08/2014   Procedure: RIGHT WRIST ARTHROSCOPY WITH DEBRIDEMENT/OR REPAIR;  Surgeon: Iran Planas, MD;  Location: [facility];  Service: Orthopedics;  Laterality: Right;   No family history on file. Social History  Substance Use Topics  . Smoking status: Current Every Day Smoker    Packs/day: 1.00    Years: 8.00    Types: Cigarettes  . Smokeless tobacco: Never Used  . Alcohol use Yes     Comment: occ    Current Outpatient Prescriptions:  .  Aspirin-Acetaminophen-Caffeine (GOODY HEADACHE PO), Take 1 Package by mouth daily as needed (headache/pain)., Disp: , Rfl:  .  docusate sodium (COLACE) 100 MG capsule, Take 1 capsule (100 mg total) by mouth 2 (two) times daily., Disp: 30 capsule, Rfl: 0 .  DULoxetine (CYMBALTA) 30 MG capsule, Take 30 mg  by mouth daily., Disp: , Rfl: 2 .  oxyCODONE-acetaminophen (ROXICET) 5-325 MG per tablet, Take 1 tablet by mouth every 4 (four) hours as needed for severe pain., Disp: 45 tablet, Rfl: 0 .  RELPAX 40 MG tablet, Take 40 mg by mouth as needed for migraine. , Disp: , Rfl: 2 .  zolpidem (AMBIEN) 10 MG tablet, Take 10 mg by mouth at bedtime as needed for sleep. , Disp: , Rfl: 0  BP 135/84   Pulse (!) 112   Ht 5\' 5"  (1.651 m)   Wt 176 lb (79.8 kg)   BMI 29.29 kg/m   Physical Exam  Constitutional: He is oriented to person, place, and time. He appears well-developed and well-nourished. No distress.  Cardiovascular: Normal rate and intact distal pulses.   Neurological: He is alert and oriented to person, place, and time.  Skin: Skin is warm and dry. Capillary refill takes less than 2 seconds. No rash noted. He is not diaphoretic. No erythema. No pallor.  Psychiatric: He has a normal mood and affect. His behavior is normal. Judgment and thought content normal.    Left Hand Exam   Tenderness  The patient is experiencing no tenderness.     Range of Motion  The patient has normal left wrist ROM.  Muscle Strength  The patient has normal left wrist strength.  Other  Erythema: absent Sensation: normal Pulse: present     The right hand is discolored bilaterally with what looks  to be oral/grease from chronic and posterior work. He has a swollen area over the middle phalanx with no erythema but extreme tenderness and definite swelling versus opposite finger with point tenderness no erythema axillary lymph nodes are negative finger flexion is decreased by 50%. The index finger has a small area of nodularity with no tenderness or swelling    ASSESSMENT: My personal interpretation of the images:  2 views one of the index finger 3 views and one of the long finger 3 views no foreign body   Probably some type of granuloma  Encounter Diagnoses  Name Primary?  . Finger pain, right Yes  .  Abscess of finger, right      Recommend incision drainage and culture  He will need to be out of work 2 weeks after the procedure PLAN Arther Abbott, MD 01/18/2016 2:53 PM

## 2016-01-18 NOTE — H&P (Addendum)
Chief Complaint  Patient presents with  . 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 He thinks he may have gotten some metal shavings in the long finger.   Today complaining of constant sharp throbbing burning stabbing aching pain and swelling over the middle phalanx with slight decreased range of motion but no fusiform swelling. He took Advil, Aleve and ibuprofen and it didn't help   Review of Systems  Constitutional: Negative.   Musculoskeletal: Positive for back pain, joint pain and myalgias.  Endo/Heme/Allergies: Positive for environmental allergies.  All other systems reviewed and are negative.         Past Medical History:  Diagnosis Date  . Anxiety    . Chronic kidney disease      stones  . Depression    . Migraine             Past Surgical History:  Procedure Laterality Date  . CARPAL TUNNEL RELEASE Right 12/08/2014    Procedure: RIGHT CARPAL TUNNEL RELEASE;  Surgeon: Iran Planas, MD;  Location: [place];  Service: Orthopedics;  Laterality: Right;  . WRIST ARTHROSCOPY Right 12/08/2014    Procedure: RIGHT WRIST ARTHROSCOPY WITH DEBRIDEMENT/OR REPAIR;  Surgeon: Iran Planas, MD;  Location: [place];  Service: Orthopedics;  Laterality: Right;    No family history on file.        Social History  Substance Use Topics  . Smoking status: Current Every Day Smoker      Packs/day: 1.00      Years: 8.00      Types: Cigarettes  . Smokeless tobacco: Never Used  . Alcohol use Yes         Comment: occ      Current Outpatient Prescriptions:  .  Aspirin-Acetaminophen-Caffeine (GOODY HEADACHE PO), Take 1 Package by mouth daily as needed (headache/pain)., Disp: , Rfl:  .  docusate sodium (COLACE) 100 MG capsule, Take 1 capsule (100 mg total) by mouth 2 (two) times daily., Disp: 30 capsule, Rfl: 0 .   DULoxetine (CYMBALTA) 30 MG capsule, Take 30 mg by mouth daily., Disp: , Rfl: 2 .  oxyCODONE-acetaminophen (ROXICET) 5-325 MG per tablet, Take 1 tablet by mouth every 4 (four) hours as needed for severe pain., Disp: 45 tablet, Rfl: 0 .  RELPAX 40 MG tablet, Take 40 mg by mouth as needed for migraine. , Disp: , Rfl: 2 .  zolpidem (AMBIEN) 10 MG tablet, Take 10 mg by mouth at bedtime as needed for sleep. , Disp: , Rfl: 0   BP 135/84   Pulse (!) 112   Ht 5\' 5"  (1.651 m)   Wt 176 lb (79.8 kg)   BMI 29.29 kg/m    Physical Exam  Constitutional: He is oriented to person, place, and time. He appears well-developed and well-nourished. No distress.  Cardiovascular: Normal rate and intact distal pulses.   Neurological: He is alert and oriented to person, place, and time.  Skin: Skin is warm and dry. Capillary refill takes less than 2 seconds. No rash noted. He is not diaphoretic. No erythema. No pallor.  Psychiatric: He has a normal mood and affect. His behavior is normal. Judgment and thought content normal.      Left Hand Exam    Tenderness  The patient is experiencing  no tenderness.        Range of Motion  The patient has normal left wrist ROM.   Muscle Strength  The patient has normal left wrist strength.   Other  Erythema: absent Sensation: normal Pulse: present       The right hand is discolored bilaterally with what looks to be oral/grease from chronic and posterior work. He has a swollen area over the middle phalanx with no erythema but extreme tenderness and definite swelling versus opposite finger with point tenderness no erythema axillary lymph nodes are negative finger flexion is decreased by 50%. The index finger has a small area of nodularity with no tenderness or swelling       ASSESSMENT: My personal interpretation of the images:  2 views one of the index finger 3 views and one of the long finger 3 views no foreign body     Probably some type of granuloma        Encounter Diagnoses  Name Primary?  . Finger pain, right Yes  . Abscess of finger, right          Recommend incision drainage and culture right long finger   He will need to be out of work 2 weeks after the procedure

## 2016-01-19 ENCOUNTER — Ambulatory Visit (HOSPITAL_COMMUNITY)
Admission: RE | Admit: 2016-01-19 | Discharge: 2016-01-19 | Disposition: A | Discharge status: Home / Self Care | Payer: Commercial Managed Care - HMO | Source: Ambulatory Visit | Attending: Orthopedic Surgery | Admitting: Orthopedic Surgery

## 2016-01-19 ENCOUNTER — Ambulatory Visit (HOSPITAL_COMMUNITY): Payer: Commercial Managed Care - HMO | Admitting: Anesthesiology

## 2016-01-19 ENCOUNTER — Encounter (HOSPITAL_COMMUNITY)
Admission: RE | Disposition: A | Discharge status: Home / Self Care | Payer: Self-pay | Source: Ambulatory Visit | Attending: Orthopedic Surgery

## 2016-01-19 ENCOUNTER — Encounter (HOSPITAL_COMMUNITY): Payer: Self-pay | Admitting: *Deleted

## 2016-01-19 DIAGNOSIS — F329 Major depressive disorder, single episode, unspecified: Secondary | ICD-10-CM | POA: Diagnosis not present

## 2016-01-19 DIAGNOSIS — L929 Granulomatous disorder of the skin and subcutaneous tissue, unspecified: Secondary | ICD-10-CM | POA: Diagnosis not present

## 2016-01-19 DIAGNOSIS — M549 Dorsalgia, unspecified: Secondary | ICD-10-CM | POA: Insufficient documentation

## 2016-01-19 DIAGNOSIS — L02511 Cutaneous abscess of right hand: Secondary | ICD-10-CM

## 2016-01-19 DIAGNOSIS — G43909 Migraine, unspecified, not intractable, without status migrainosus: Secondary | ICD-10-CM | POA: Insufficient documentation

## 2016-01-19 DIAGNOSIS — N189 Chronic kidney disease, unspecified: Secondary | ICD-10-CM | POA: Diagnosis not present

## 2016-01-19 DIAGNOSIS — F17210 Nicotine dependence, cigarettes, uncomplicated: Secondary | ICD-10-CM | POA: Insufficient documentation

## 2016-01-19 DIAGNOSIS — Z7982 Long term (current) use of aspirin: Secondary | ICD-10-CM | POA: Insufficient documentation

## 2016-01-19 DIAGNOSIS — Z79899 Other long term (current) drug therapy: Secondary | ICD-10-CM | POA: Insufficient documentation

## 2016-01-19 DIAGNOSIS — F419 Anxiety disorder, unspecified: Secondary | ICD-10-CM | POA: Diagnosis not present

## 2016-01-19 HISTORY — PX: INCISION AND DRAINAGE: SHX5863

## 2016-01-19 LAB — CBC
HCT: 46.8 % (ref 39.0–52.0)
HEMOGLOBIN: 15.8 g/dL (ref 13.0–17.0)
MCH: 31.8 pg (ref 26.0–34.0)
MCHC: 33.8 g/dL (ref 30.0–36.0)
MCV: 94.2 fL (ref 78.0–100.0)
PLATELETS: 213 10*3/uL (ref 150–400)
RBC: 4.97 MIL/uL (ref 4.22–5.81)
RDW: 13.7 % (ref 11.5–15.5)
WBC: 10.7 10*3/uL — AB (ref 4.0–10.5)

## 2016-01-19 LAB — BASIC METABOLIC PANEL
ANION GAP: 10 (ref 5–15)
BUN: 19 mg/dL (ref 6–20)
CALCIUM: 8.8 mg/dL — AB (ref 8.9–10.3)
CHLORIDE: 105 mmol/L (ref 101–111)
CO2: 23 mmol/L (ref 22–32)
Creatinine, Ser: 0.94 mg/dL (ref 0.61–1.24)
GFR calc non Af Amer: >60 mL/min (ref >60)
Glucose, Bld: 89 mg/dL (ref 65–99)
Potassium: 3.9 mmol/L (ref 3.5–5.1)
SODIUM: 138 mmol/L (ref 135–145)

## 2016-01-19 SURGERY — INCISION AND DRAINAGE
Anesthesia: General | Site: Finger | Laterality: Right

## 2016-01-19 MED ORDER — FENTANYL CITRATE (PF) 100 MCG/2ML IJ SOLN
INTRAMUSCULAR | Status: DC | PRN
  Administered 2016-01-19 (×2): 50 ug via INTRAVENOUS

## 2016-01-19 MED ORDER — ONDANSETRON HCL 4 MG/2ML IJ SOLN
INTRAMUSCULAR | Status: AC
  Filled 2016-01-19: qty 2

## 2016-01-19 MED ORDER — MIDAZOLAM HCL 2 MG/2ML IJ SOLN
1.0000 mg | INTRAMUSCULAR | Status: DC | PRN
  Administered 2016-01-19: 2 mg via INTRAVENOUS

## 2016-01-19 MED ORDER — FENTANYL CITRATE (PF) 100 MCG/2ML IJ SOLN
INTRAMUSCULAR | Status: AC
  Filled 2016-01-19: qty 2

## 2016-01-19 MED ORDER — SODIUM CHLORIDE 0.9 % IR SOLN
Status: DC | PRN
  Administered 2016-01-19: 1000 mL

## 2016-01-19 MED ORDER — BUPIVACAINE HCL (PF) 0.5 % IJ SOLN
INTRAMUSCULAR | Status: AC
  Filled 2016-01-19: qty 30

## 2016-01-19 MED ORDER — MIDAZOLAM HCL 5 MG/5ML IJ SOLN
INTRAMUSCULAR | Status: DC | PRN
  Administered 2016-01-19: 2 mg via INTRAVENOUS

## 2016-01-19 MED ORDER — ONDANSETRON HCL 4 MG/2ML IJ SOLN
4.0000 mg | Freq: Once | INTRAMUSCULAR | Status: AC
  Administered 2016-01-19: 4 mg via INTRAVENOUS

## 2016-01-19 MED ORDER — LACTATED RINGERS IV SOLN
INTRAVENOUS | Status: DC
  Administered 2016-01-19: 11:00:00 via INTRAVENOUS

## 2016-01-19 MED ORDER — PROPOFOL 10 MG/ML IV BOLUS
INTRAVENOUS | Status: AC
  Filled 2016-01-19: qty 20

## 2016-01-19 MED ORDER — SODIUM CHLORIDE 0.9 % IV SOLN
INTRAVENOUS | Status: DC | PRN
  Administered 2016-01-19: 11:00:00 via INTRAVENOUS

## 2016-01-19 MED ORDER — CHLORHEXIDINE GLUCONATE 4 % EX LIQD: 60.0000 mL | Freq: Once | CUTANEOUS | Status: DC

## 2016-01-19 MED ORDER — VANCOMYCIN HCL IN DEXTROSE 1-5 GM/200ML-% IV SOLN
1000.0000 mg | INTRAVENOUS | Status: AC
  Administered 2016-01-19: 1000 mg via INTRAVENOUS

## 2016-01-19 MED ORDER — MIDAZOLAM HCL 2 MG/2ML IJ SOLN
INTRAMUSCULAR | Status: AC
  Filled 2016-01-19: qty 2

## 2016-01-19 MED ORDER — PROPOFOL 10 MG/ML IV BOLUS
INTRAVENOUS | Status: DC | PRN
  Administered 2016-01-19: 150 mg via INTRAVENOUS

## 2016-01-19 MED ORDER — VANCOMYCIN HCL IN DEXTROSE 1-5 GM/200ML-% IV SOLN
INTRAVENOUS | Status: AC
  Filled 2016-01-19: qty 200

## 2016-01-19 MED ORDER — FENTANYL CITRATE (PF) 100 MCG/2ML IJ SOLN
25.0000 ug | INTRAMUSCULAR | Status: DC | PRN
  Administered 2016-01-19: 25 ug via INTRAVENOUS

## 2016-01-19 MED ORDER — ONDANSETRON HCL 4 MG/2ML IJ SOLN
4.0000 mg | Freq: Once | INTRAMUSCULAR | Status: AC
  Administered 2016-01-19: 4 mg via INTRAVENOUS
  Filled 2016-01-19: qty 2

## 2016-01-19 MED ORDER — HYDROMORPHONE HCL 1 MG/ML IJ SOLN
0.2500 mg | INTRAMUSCULAR | Status: DC | PRN
  Administered 2016-01-19 (×4): 0.5 mg via INTRAVENOUS
  Filled 2016-01-19 (×2): qty 1

## 2016-01-19 MED ORDER — KETOROLAC TROMETHAMINE 30 MG/ML IJ SOLN
30.0000 mg | Freq: Once | INTRAMUSCULAR | Status: AC
  Administered 2016-01-19: 30 mg via INTRAVENOUS
  Filled 2016-01-19: qty 1

## 2016-01-19 MED ORDER — BUPIVACAINE HCL (PF) 0.5 % IJ SOLN
INTRAMUSCULAR | Status: DC | PRN
  Administered 2016-01-19: 10 mL

## 2016-01-19 MED ORDER — LIDOCAINE HCL 1 % IJ SOLN
INTRAMUSCULAR | Status: DC | PRN
  Administered 2016-01-19: 30 mg via INTRADERMAL

## 2016-01-19 SURGICAL SUPPLY — 36 items
BAG HAMPER (MISCELLANEOUS) ×2 IMPLANT
BANDAGE ELASTIC 3 VELCRO ST LF (GAUZE/BANDAGES/DRESSINGS) IMPLANT
BANDAGE ELASTIC 4 LF NS (GAUZE/BANDAGES/DRESSINGS) ×2 IMPLANT
BANDAGE ESMARK 4X12 BL STRL LF (DISPOSABLE) ×1 IMPLANT
BNDG CONFORM 2 STRL LF (GAUZE/BANDAGES/DRESSINGS) ×2 IMPLANT
BNDG ESMARK 4X12 BLUE STRL LF (DISPOSABLE) ×2
CLOTH BEACON ORANGE TIMEOUT ST (SAFETY) ×2 IMPLANT
COVER LIGHT HANDLE STERIS (MISCELLANEOUS) ×4 IMPLANT
CUFF TOURNIQUET SINGLE 18IN (TOURNIQUET CUFF) ×2 IMPLANT
DRSG ALLEVYN 2X2 (GAUZE/BANDAGES/DRESSINGS) IMPLANT
DRSG XEROFORM 1X8 (GAUZE/BANDAGES/DRESSINGS) ×2 IMPLANT
ELECT REM PT RETURN 9FT ADLT (ELECTROSURGICAL) ×2
ELECTRODE REM PT RTRN 9FT ADLT (ELECTROSURGICAL) ×1 IMPLANT
FORMALIN 10 PREFIL 120ML (MISCELLANEOUS) ×2 IMPLANT
GAUZE SPONGE 4X4 12PLY STRL (GAUZE/BANDAGES/DRESSINGS) ×2 IMPLANT
GLOVE BIOGEL PI IND STRL 7.0 (GLOVE) ×1 IMPLANT
GLOVE BIOGEL PI INDICATOR 7.0 (GLOVE) ×1
GLOVE SKINSENSE NS SZ8.0 LF (GLOVE) ×1
GLOVE SKINSENSE STRL SZ8.0 LF (GLOVE) ×1 IMPLANT
GLOVE SS N UNI LF 8.5 STRL (GLOVE) ×2 IMPLANT
GOWN STRL REUS W/TWL LRG LVL3 (GOWN DISPOSABLE) ×4 IMPLANT
GOWN STRL REUS W/TWL XL LVL3 (GOWN DISPOSABLE) ×2 IMPLANT
HAND ALUMI LG (SOFTGOODS) ×2 IMPLANT
KIT ROOM TURNOVER APOR (KITS) ×2 IMPLANT
MANIFOLD NEPTUNE II (INSTRUMENTS) ×2 IMPLANT
NEEDLE HYPO 21X1.5 SAFETY (NEEDLE) ×2 IMPLANT
NS IRRIG 1000ML POUR BTL (IV SOLUTION) ×2 IMPLANT
PACK BASIC LIMB (CUSTOM PROCEDURE TRAY) IMPLANT
PAD ARMBOARD 7.5X6 YLW CONV (MISCELLANEOUS) ×2 IMPLANT
SET BASIN LINEN APH (SET/KITS/TRAYS/PACK) ×2 IMPLANT
SUT ETHILON 3 0 FSL (SUTURE) ×2 IMPLANT
SWAB CULTURE LIQ STUART DBL (MISCELLANEOUS) ×2 IMPLANT
SYR BULB IRRIGATION 50ML (SYRINGE) ×2 IMPLANT
SYR CONTROL 10ML LL (SYRINGE) ×2 IMPLANT
TUBE ANAEROBIC PORT A CUL  W/M (MISCELLANEOUS) ×2 IMPLANT
VESSEL LOOPS MAXI RED (MISCELLANEOUS) IMPLANT

## 2016-01-19 NOTE — Anesthesia Procedure Notes (Signed)
Procedure Name: LMA Insertion Performed by: Charmaine Downs Pre-anesthesia Checklist: Patient identified, Patient being monitored, Emergency Drugs available, Timeout performed and Suction available Patient Re-evaluated:Patient Re-evaluated prior to inductionOxygen Delivery Method: Circle System Utilized Preoxygenation: Pre-oxygenation with 100% oxygen Intubation Type: IV induction Ventilation: Mask ventilation without difficulty LMA: LMA inserted LMA Size: 4.0 Number of attempts: 1 Placement Confirmation: positive ETCO2 and breath sounds checked- equal and bilateral Tube secured with: Tape Dental Injury: Teeth and Oropharynx as per pre-operative assessment

## 2016-01-19 NOTE — Op Note (Signed)
01/19/2016  11:41 AM  PATIENT:  John Jones  45 y.o. male  PRE-OPERATIVE DIAGNOSIS:  RIGHT LONG FINGER ABCESS  POST-OPERATIVE DIAGNOSIS:  RIGHT LONG FINGER GRANULOMA  PROCEDURE:  Procedure(s) with comments: INCISION AND DRAINAGE RIGHT LONG FINGER (Right) - RIGHT LONG FINGER - PT COMING AT 9:00 FOR LABWORK   Surgical findings: There is no purulent material found in the wound. There was a large collection of tissue which was firm and appeared to be more like a granuloma this was above the flexor tendon sheaths.  The surgery was done as follows  The patient presented with a three-week history of pain in the right long finger after impaling his finger on some metal shards which she thinks he removed but over 3 weeks. 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 He was taken to surgery we gave him a gram of Ancef he had general anesthesia with LMA.  The hand was prepped and draped with ChloraPrep with sterile draping technique.  The surgical team took the appropriate and required timeout to confirm the surgical site and patient.  The limb was exsanguinated with a 4 inch Esmarch bandage and the tourniquet was inflated to 250 mmHg.  The incision was made over the mass with an elliptical portion of the incision directly at the mass and then vertical incisions pre-and post mass. The mass was sharply excised and inspected. It was firm tissue there was no purulence there was no metal. It appeared to be a granuloma-like reaction.  The wound was irrigated further exploration was performed and no foreign body was found secondary irrigation was performed and then the wound was closed with several 3-0 nylon interrupted  sutures followed by injection of the finger with 10 mL of plain Marcaine  A sterile bandage was applied. The tourniquet was released. Color and capillary refill returned to normal.  The patient was extubated and taken to recovery room in stable condition.  The sutures should be able to come out in 10 days at which time he can return to work.  We did culture the wound for anaerobic and aerobic  I also sent the tissue as a specimen for pathology  There were no assistants  Anesthesia was LMA general  There was no blood loss  No drains  Specimens as described  Counts were correct  Specimens went to pathology and microbiology  Dragon Dictation  To PACU in stable condition  No DVT indications needed  Patient will go home after recovery.   SURGEON:  Surgeon(s) and Role:    * Carole Civil, MD - Primary  EBL:  Total I/O In: 500 [I.V.:500] Out: 0

## 2016-01-19 NOTE — Anesthesia Postprocedure Evaluation (Signed)
Anesthesia Post Note  Patient: John Jones  Procedure(s) Performed: Procedure(s) (LRB): INCISION AND DRAINAGE RIGHT LONG FINGER (Right)  Patient location during evaluation: PACU Anesthesia Type: General Level of consciousness: awake, oriented and patient cooperative Pain management: pain level controlled Vital Signs Assessment: post-procedure vital signs reviewed and stable Respiratory status: spontaneous breathing, nonlabored ventilation and respiratory function stable Cardiovascular status: blood pressure returned to baseline Postop Assessment: no signs of nausea or vomiting Anesthetic complications: no    Last Vitals:  Vitals:   01/19/16 1045 01/19/16 1050  BP:    Pulse:    Resp: 13 20  Temp:      Last Pain:  Vitals:   01/19/16 0949  TempSrc: Oral  PainSc: 7                  Deray Dawes J

## 2016-01-19 NOTE — Brief Op Note (Signed)
01/19/2016  11:41 AM  PATIENT:  John Jones  45 y.o. male  PRE-OPERATIVE DIAGNOSIS:  RIGHT LONG FINGER ABCESS  POST-OPERATIVE DIAGNOSIS:  RIGHT LONG FINGER GRANULOMA  PROCEDURE:  Procedure(s) with comments: INCISION AND DRAINAGE RIGHT LONG FINGER (Right) - RIGHT LONG FINGER - PT COMING AT 9:00 FOR LABWORK   Surgical findings: There is no purulent material found in the wound. There was a large collection of tissue which was firm and appeared to be more like a granuloma this was above the flexor tendon sheaths.  The surgery was done as follows  The patient presented with a three-week history of pain in the right long finger after impaling his finger on some metal shards which she thinks he removed but over 3 weeks. 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 He was taken to surgery we gave him a gram of Ancef he had general anesthesia with LMA.  The hand was prepped and draped with ChloraPrep with sterile draping technique.  The surgical team took the appropriate and required timeout to confirm the surgical site and patient.  The limb was exsanguinated with a 4 inch Esmarch bandage and the tourniquet was inflated to 250 mmHg.  The incision was made over the mass with an elliptical portion of the incision directly at the mass and then vertical incisions pre-and post mass. The mass was sharply excised and inspected. It was firm tissue there was no purulence there was no metal. It appeared to be a granuloma-like reaction.  The wound was irrigated further exploration was performed and no foreign body was found secondary irrigation was performed and then the wound was closed with several 3-0 nylon interrupted  sutures followed by injection of the finger with 10 mL of plain Marcaine  A sterile bandage was applied. The tourniquet was released. Color and capillary refill returned to normal.  The patient was extubated and taken to recovery room in stable condition.  The sutures should be able to come out in 10 days at which time he can return to work.  We did culture the wound for anaerobic and aerobic  I also sent the tissue as a specimen for pathology  There were no assistants  Anesthesia was LMA general  There was no blood loss  No drains  Specimens as described  Counts were correct  Specimens went to pathology and microbiology  Dragon Dictation  To PACU in stable condition  No DVT indications needed  Patient will go home after recovery.   SURGEON:  Surgeon(s) and Role:    * Carole Civil, MD - Primary  EBL:  Total I/O In: 500 [I.V.:500] Out: 0

## 2016-01-19 NOTE — Transfer of Care (Signed)
Immediate Anesthesia Transfer of Care Note  Patient: John Jones  Procedure(s) Performed: Procedure(s) with comments: INCISION AND DRAINAGE RIGHT LONG FINGER (Right) - RIGHT LONG FINGER - PT COMING AT 9:00 FOR LABWORK  Patient Location: PACU  Anesthesia Type:General  Level of Consciousness: awake and patient cooperative  Airway & Oxygen Therapy: Patient Spontanous Breathing and Patient connected to face mask oxygen  Post-op Assessment: Report given to RN, Post -op Vital signs reviewed and stable and Patient moving all extremities  Post vital signs: Reviewed and stable  Last Vitals:  Vitals:   01/19/16 1045 01/19/16 1050  BP:    Pulse:    Resp: 13 20  Temp:      Last Pain:  Vitals:   01/19/16 0949  TempSrc: Oral  PainSc: 7       Patients Stated Pain Goal: 5 ([id number])  Complications: No apparent anesthesia complications

## 2016-01-19 NOTE — Anesthesia Preprocedure Evaluation (Signed)
Anesthesia Evaluation  Patient identified by MRN, date of birth, ID band Patient awake    Reviewed: Allergy & Precautions, NPO status , Patient's Chart, lab work & pertinent test results  Airway Mallampati: II  TM Distance: >3 FB     Dental  (+) Teeth Intact   Pulmonary Current Smoker,    breath sounds clear to auscultation       Cardiovascular negative cardio ROS   Rhythm:Regular Rate:Normal     Neuro/Psych  Headaches, PSYCHIATRIC DISORDERS Anxiety Depression    GI/Hepatic negative GI ROS,   Endo/Other    Renal/GU      Musculoskeletal   Abdominal   Peds  Hematology   Anesthesia Other Findings   Reproductive/Obstetrics                             Anesthesia Physical Anesthesia Plan  ASA: II  Anesthesia Plan: General   Post-op Pain Management:    Induction: Intravenous  Airway Management Planned: LMA  Additional Equipment:   Intra-op Plan:   Post-operative Plan: Extubation in OR  Informed Consent: I have reviewed the patients History and Physical, chart, labs and discussed the procedure including the risks, benefits and alternatives for the proposed anesthesia with the patient or authorized representative who has indicated his/her understanding and acceptance.     Plan Discussed with:   Anesthesia Plan Comments:         Anesthesia Quick Evaluation

## 2016-01-19 NOTE — Interval H&P Note (Signed)
History and Physical Interval Note:  01/19/2016 10:01 AM  Pulse 81   Temp 98 F (36.7 C) (Oral)   Ht 5\' 5"  (1.651 m)   Wt 176 lb (79.8 kg)   BMI 29.29 kg/m  Surgical site appropriate for surgery   John Jones  has presented today for surgery, with the diagnosis of RIGHT LONG FINGER ABCESS  The various methods of treatment have been discussed with the patient and family. After consideration of risks, benefits and other options for treatment, the patient has consented to  Procedure(s) with comments: INCISION AND DRAINAGE RIGHT LONG FINGER (Right) - RIGHT LONG FINGER - PT COMING AT 9:00 FOR LABWORK as a surgical intervention .  The patient's history has been reviewed, patient examined, no change in status, stable for surgery.  I have reviewed the patient's chart and labs.  Questions were answered to the patient's satisfaction.     Arther Abbott

## 2016-01-23 ENCOUNTER — Telehealth: Payer: Self-pay | Admitting: Orthopedic Surgery

## 2016-01-23 ENCOUNTER — Other Ambulatory Visit: Payer: Self-pay | Admitting: *Deleted

## 2016-01-23 MED ORDER — PROMETHAZINE HCL 12.5 MG PO TABS: 12.5000 mg | ORAL_TABLET | ORAL | 0 refills | Status: DC | PRN

## 2016-01-23 NOTE — Telephone Encounter (Signed)
Routing to Dr. Harrison to advise 

## 2016-01-23 NOTE — Telephone Encounter (Signed)
Patient called to relay that the medication prescribed (below) is causing nausea.  He is status/post right finger surgery 01/19/16, and is due for post op appointment 01/25/16. oxyCODONE-acetaminophen (PERCOCET/ROXICET) 5-325 MG tablet 42 tablet

## 2016-01-23 NOTE — Progress Notes (Unsigned)
phen

## 2016-01-23 NOTE — Telephone Encounter (Signed)
Medication ordered, patient aware

## 2016-01-23 NOTE — Telephone Encounter (Signed)
Phenergan 12.5 mg q4 prn pain # 30

## 2016-01-24 ENCOUNTER — Encounter (HOSPITAL_COMMUNITY): Payer: Self-pay | Admitting: Orthopedic Surgery

## 2016-01-24 LAB — AEROBIC/ANAEROBIC CULTURE (SURGICAL/DEEP WOUND)

## 2016-01-24 LAB — AEROBIC/ANAEROBIC CULTURE W GRAM STAIN (SURGICAL/DEEP WOUND): Culture: NO GROWTH

## 2016-01-25 ENCOUNTER — Encounter: Payer: Self-pay | Admitting: Orthopedic Surgery

## 2016-01-25 ENCOUNTER — Ambulatory Visit (INDEPENDENT_AMBULATORY_CARE_PROVIDER_SITE_OTHER): Payer: Commercial Managed Care - HMO | Admitting: Orthopedic Surgery

## 2016-01-25 VITALS — BP 125/86 | HR 121 | Ht 65.0 in | Wt 176.0 lb

## 2016-01-25 DIAGNOSIS — M79644 Pain in right finger(s): Secondary | ICD-10-CM

## 2016-01-25 DIAGNOSIS — Z4789 Encounter for other orthopedic aftercare: Secondary | ICD-10-CM

## 2016-01-25 DIAGNOSIS — L02511 Cutaneous abscess of right hand: Secondary | ICD-10-CM

## 2016-01-25 MED ORDER — OXYCODONE-ACETAMINOPHEN 5-325 MG PO TABS: 1.0000 | ORAL_TABLET | Freq: Four times a day (QID) | ORAL | 0 refills | Status: DC | PRN

## 2016-01-25 NOTE — Patient Instructions (Signed)
Cover with glove to shower

## 2016-01-25 NOTE — Progress Notes (Signed)
Patient ID: John Jones, male   DOB: 11-16-1970, 45 y.o.   MRN: [id number]  Chief Complaint  Patient presents with  . Follow-up    post op 1, wound check, I&D right long finger, DOS 01/19/16    BP 125/86   Pulse (!) 121   Ht 5\' 5"  (1.651 m)   Wt 176 lb (79.8 kg)   BMI 29.29 kg/m   2 was negative and path report showed granulomatous reaction  Wound looks clean finger slightly stiff   Encounter Diagnoses  Name Primary?  . Finger pain, right   . Abscess of finger, right      ASSESSMENT AND PLAN   Recommend active range of motion  Decrease Percocet to every 6 #24 come back for suture removal postop day 12

## 2016-01-31 ENCOUNTER — Ambulatory Visit (INDEPENDENT_AMBULATORY_CARE_PROVIDER_SITE_OTHER): Payer: Commercial Managed Care - HMO | Admitting: Orthopedic Surgery

## 2016-01-31 ENCOUNTER — Encounter: Payer: Self-pay | Admitting: Orthopedic Surgery

## 2016-01-31 VITALS — BP 138/98 | HR 124 | Ht 65.0 in | Wt 176.0 lb

## 2016-01-31 DIAGNOSIS — M79644 Pain in right finger(s): Secondary | ICD-10-CM

## 2016-01-31 DIAGNOSIS — Z4789 Encounter for other orthopedic aftercare: Secondary | ICD-10-CM

## 2016-01-31 DIAGNOSIS — L02511 Cutaneous abscess of right hand: Secondary | ICD-10-CM

## 2016-01-31 MED ORDER — HYDROCODONE-ACETAMINOPHEN 5-325 MG PO TABS: 1.0000 | ORAL_TABLET | Freq: Three times a day (TID) | ORAL | 0 refills | Status: DC | PRN

## 2016-01-31 NOTE — Progress Notes (Signed)
POST OP   Chief Complaint  Patient presents with  . Follow-up    post op Rt long finger, DOS 01/19/16   Granulomatous reaction of the right long finger confirmed by excisional biopsy. The sutures are removed today. The patient still complains of pain and stiffness  Recommend a home exercise program for active range of motion and we gave him a prescription for hydrocodone. Postop pain medicine was oxycodone which is obviously more medicine than should be needed at this time  Follow-up with [place] as needed

## 2016-01-31 NOTE — Patient Instructions (Signed)
Soak finger in warm water for 20 min then, Bend the finger 25 times   Return as needed   You have a prescription of hydrocodone which is an opioid. It is a restricted medication and you can not have another prescription of hydrocodone or oxycodone

## 2016-06-11 DIAGNOSIS — M546 Pain in thoracic spine: Secondary | ICD-10-CM | POA: Diagnosis not present

## 2016-06-11 DIAGNOSIS — S134XXA Sprain of ligaments of cervical spine, initial encounter: Secondary | ICD-10-CM | POA: Diagnosis not present

## 2016-06-11 DIAGNOSIS — S338XXA Sprain of other parts of lumbar spine and pelvis, initial encounter: Secondary | ICD-10-CM | POA: Diagnosis not present

## 2016-06-13 DIAGNOSIS — S134XXA Sprain of ligaments of cervical spine, initial encounter: Secondary | ICD-10-CM | POA: Diagnosis not present

## 2016-06-13 DIAGNOSIS — S338XXA Sprain of other parts of lumbar spine and pelvis, initial encounter: Secondary | ICD-10-CM | POA: Diagnosis not present

## 2016-06-13 DIAGNOSIS — M546 Pain in thoracic spine: Secondary | ICD-10-CM | POA: Diagnosis not present

## 2016-06-17 DIAGNOSIS — Z299 Encounter for prophylactic measures, unspecified: Secondary | ICD-10-CM | POA: Diagnosis not present

## 2016-06-17 DIAGNOSIS — Z713 Dietary counseling and surveillance: Secondary | ICD-10-CM | POA: Diagnosis not present

## 2016-06-21 DIAGNOSIS — R509 Fever, unspecified: Secondary | ICD-10-CM | POA: Diagnosis not present

## 2016-06-21 DIAGNOSIS — R0981 Nasal congestion: Secondary | ICD-10-CM | POA: Diagnosis not present

## 2016-06-24 DIAGNOSIS — R52 Pain, unspecified: Secondary | ICD-10-CM | POA: Diagnosis not present

## 2016-06-24 DIAGNOSIS — J069 Acute upper respiratory infection, unspecified: Secondary | ICD-10-CM | POA: Diagnosis not present

## 2016-06-24 DIAGNOSIS — R05 Cough: Secondary | ICD-10-CM | POA: Diagnosis not present

## 2016-07-22 ENCOUNTER — Emergency Department (HOSPITAL_COMMUNITY)
Admission: EM | Admit: 2016-07-22 | Discharge: 2016-07-22 | Disposition: A | Discharge status: Home / Self Care | Payer: Commercial Managed Care - HMO | Source: Home / Self Care | Attending: Emergency Medicine | Admitting: Emergency Medicine

## 2016-07-22 ENCOUNTER — Emergency Department (HOSPITAL_COMMUNITY): Payer: Commercial Managed Care - HMO

## 2016-07-22 ENCOUNTER — Encounter (HOSPITAL_COMMUNITY): Payer: Self-pay | Admitting: Emergency Medicine

## 2016-07-22 DIAGNOSIS — N202 Calculus of kidney with calculus of ureter: Secondary | ICD-10-CM | POA: Diagnosis not present

## 2016-07-22 DIAGNOSIS — N200 Calculus of kidney: Secondary | ICD-10-CM | POA: Diagnosis not present

## 2016-07-22 DIAGNOSIS — F17210 Nicotine dependence, cigarettes, uncomplicated: Secondary | ICD-10-CM

## 2016-07-22 DIAGNOSIS — N189 Chronic kidney disease, unspecified: Secondary | ICD-10-CM | POA: Insufficient documentation

## 2016-07-22 DIAGNOSIS — R000 Tachycardia, unspecified: Secondary | ICD-10-CM | POA: Insufficient documentation

## 2016-07-22 DIAGNOSIS — N23 Unspecified renal colic: Secondary | ICD-10-CM

## 2016-07-22 DIAGNOSIS — N201 Calculus of ureter: Secondary | ICD-10-CM | POA: Diagnosis not present

## 2016-07-22 DIAGNOSIS — R109 Unspecified abdominal pain: Secondary | ICD-10-CM | POA: Diagnosis not present

## 2016-07-22 LAB — URINALYSIS, ROUTINE W REFLEX MICROSCOPIC
BACTERIA UA: NONE SEEN
BILIRUBIN URINE: NEGATIVE
Glucose, UA: NEGATIVE mg/dL
Ketones, ur: NEGATIVE mg/dL
NITRITE: NEGATIVE
PROTEIN: 30 mg/dL — AB
SPECIFIC GRAVITY, URINE: 1.017 (ref 1.005–1.030)
pH: 6 (ref 5.0–8.0)

## 2016-07-22 MED ORDER — OXYCODONE-ACETAMINOPHEN 5-325 MG PO TABS: 2.0000 | ORAL_TABLET | ORAL | 0 refills | Status: DC | PRN

## 2016-07-22 MED ORDER — PROMETHAZINE HCL 12.5 MG PO TABS: 12.5000 mg | ORAL_TABLET | Freq: Four times a day (QID) | ORAL | 0 refills | Status: DC | PRN

## 2016-07-22 MED ORDER — HYDROMORPHONE HCL 1 MG/ML IJ SOLN
1.0000 mg | Freq: Once | INTRAMUSCULAR | Status: AC
Start: 2016-07-22 — End: 2016-07-22
  Administered 2016-07-22: 1 mg via INTRAVENOUS
  Filled 2016-07-22: qty 1

## 2016-07-22 MED ORDER — PROMETHAZINE HCL 25 MG RE SUPP: 25.0000 mg | Freq: Four times a day (QID) | RECTAL | 0 refills | Status: DC | PRN

## 2016-07-22 MED ORDER — TAMSULOSIN HCL 0.4 MG PO CAPS: 0.4000 mg | ORAL_CAPSULE | Freq: Every day | ORAL | 0 refills | Status: DC

## 2016-07-22 MED ORDER — ONDANSETRON 4 MG PREPACK (~~LOC~~): ORAL_TABLET | ORAL | 0 refills | Status: DC

## 2016-07-22 MED ORDER — KETOROLAC TROMETHAMINE 30 MG/ML IJ SOLN
30.0000 mg | Freq: Once | INTRAMUSCULAR | Status: AC
  Administered 2016-07-22: 30 mg via INTRAVENOUS
  Filled 2016-07-22: qty 1

## 2016-07-22 MED ORDER — LEVOFLOXACIN IN D5W 500 MG/100ML IV SOLN
INTRAVENOUS | Status: AC
  Filled 2016-07-22: qty 100

## 2016-07-22 MED ORDER — HYDROMORPHONE HCL 1 MG/ML IJ SOLN
1.0000 mg | Freq: Once | INTRAMUSCULAR | Status: AC
  Administered 2016-07-22: 1 mg via INTRAVENOUS
  Filled 2016-07-22: qty 1

## 2016-07-22 MED ORDER — OXYCODONE-ACETAMINOPHEN 5-325 MG PO TABS: 1.0000 | ORAL_TABLET | Freq: Four times a day (QID) | ORAL | 0 refills | Status: DC | PRN

## 2016-07-22 MED ORDER — ONDANSETRON HCL 4 MG/2ML IJ SOLN
4.0000 mg | Freq: Once | INTRAMUSCULAR | Status: AC
  Administered 2016-07-22: 4 mg via INTRAVENOUS
  Filled 2016-07-22: qty 2

## 2016-07-22 MED ORDER — PROMETHAZINE HCL 25 MG RE SUPP
RECTAL | Status: AC
  Filled 2016-07-22: qty 4

## 2016-07-22 MED ORDER — LEVOFLOXACIN IN D5W 500 MG/100ML IV SOLN
500.0000 mg | Freq: Once | INTRAVENOUS | Status: AC
  Administered 2016-07-22: 500 mg via INTRAVENOUS
  Filled 2016-07-22: qty 100

## 2016-07-22 MED ORDER — PROCHLORPERAZINE EDISYLATE 5 MG/ML IJ SOLN
10.0000 mg | Freq: Once | INTRAMUSCULAR | Status: AC
  Administered 2016-07-22: 10 mg via INTRAVENOUS
  Filled 2016-07-22: qty 2

## 2016-07-22 MED ORDER — MELOXICAM 15 MG PO TABS: 15.0000 mg | ORAL_TABLET | Freq: Every day | ORAL | 0 refills | Status: DC

## 2016-07-22 NOTE — ED Notes (Signed)
Gave patient urine strainer, urine cup, and urinal for use at home. Patient verbalized understanding. Also gave patient pre-pack of oxycodone and zofran for use to home. Patient verbalized understanding of use of medications.

## 2016-07-22 NOTE — ED Triage Notes (Signed)
Patient c/o right flank pain that started today. Denies any blood in urine or decreased flow. Denies any fevers, vomiting, or diarrhea. Per patient nausea. Hx of kidney stones in which this feels similar.

## 2016-07-22 NOTE — Discharge Instructions (Signed)
You have 2 large stones on the right side. You have 3 very tiny stones inside the kidney on the left, but no stones in the ureter on the left. Please use Percocet and meloxicam and flomax daily. Please strain all urine. Please call [facility] for appointment as soon as possible. Return to this ER, or the [facility] ER if pain not managed with current medication.

## 2016-07-22 NOTE — ED Provider Notes (Signed)
[facility] DEPT Provider Note   CSN: [id number] Arrival date & time: 07/22/16  1813  By signing my name below, I, Collene Leyden, attest that this documentation has been prepared under the direction and in the presence of [employer] . Electronically Signed: Collene Leyden, Scribe. 07/22/16. 7:39 PM.   History   Chief Complaint Chief Complaint  Patient presents with  . Flank Pain   HPI Comments: John Jones is a 46 y.o. male with a hx of CKD, who presents to the Emergency Department complaining of gradually worsening, constant right flank pain that began at about 12 PM today. Patient states his pain has gradually worsened since this afternoon. Patient states he had similar pain one year ago when he was diagnosed with a kidney stone. Patient has associated nausea and hematuria. Patient describes the pain as both dull and cramping. No modifying factors indicated. Patient denies any emesis, recent trauma, or any recent procedures.   HPI  Past Medical History:  Diagnosis Date  . Anxiety   . Chronic kidney disease    stones  . Depression   . Migraine     Patient Active Problem List   Diagnosis Date Noted  . Abscess of finger of right hand     Past Surgical History:  Procedure Laterality Date  . CARPAL TUNNEL RELEASE Right 12/08/2014   Procedure: RIGHT CARPAL TUNNEL RELEASE;  Surgeon: Iran Planas, MD;  Location: [place] [facility];  Service: Orthopedics;  Laterality: Right;  . CYSTOSCOPY Right 12/2015   stent placed and removed   . INCISION AND DRAINAGE Right 01/19/2016   Procedure: INCISION AND DRAINAGE RIGHT LONG FINGER;  Surgeon: Carole Civil, MD;  Location: [facility];  Service: Orthopedics;  Laterality: Right;  RIGHT LONG FINGER - PT COMING AT 9:00 FOR LABWORK  . WRIST ARTHROSCOPY Right 12/08/2014   Procedure: RIGHT WRIST ARTHROSCOPY WITH DEBRIDEMENT/OR REPAIR;  Surgeon: Iran Planas, MD;  Location: [place];  Service: Orthopedics;  Laterality: Right;       Home  Medications    Prior to Admission medications   Medication Sig Start Date End Date Taking? Authorizing Provider  HYDROcodone-acetaminophen (NORCO) 5-325 MG tablet Take 1 tablet by mouth every 8 (eight) hours as needed for moderate pain. 01/31/16   Carole Civil, MD  oxyCODONE-acetaminophen (PERCOCET/ROXICET) 5-325 MG tablet Take 1 tablet by mouth every 6 (six) hours as needed for severe pain. 01/25/16   Carole Civil, MD  promethazine (PHENERGAN) 12.5 MG tablet Take 1 tablet (12.5 mg total) by mouth every 4 (four) hours as needed for nausea or vomiting. 01/23/16   Carole Civil, MD    Family History History reviewed. No pertinent family history.  Social History Social History  Substance Use Topics  . Smoking status: Current Every Day Smoker    Packs/day: 1.00    Years: 8.00    Types: Cigarettes  . Smokeless tobacco: Never Used  . Alcohol use Yes     Comment: occ     Allergies   Penicillins   Review of Systems Review of Systems  Gastrointestinal: Positive for nausea. Negative for diarrhea and vomiting.  Genitourinary: Positive for flank pain and hematuria.  All other systems reviewed and are negative.    Physical Exam Updated Vital Signs BP (!) 179/118 (BP Location: Right Arm)   Pulse 103   Temp 98 F (36.7 C) (Oral)   Resp 24   Ht 5\' 3"  (1.6 m)   Wt 170 lb (77.1 kg)   SpO2 100%  BMI 30.11 kg/m   Physical Exam  Constitutional: He is oriented to person, place, and time. He appears well-developed and well-nourished.  Non-toxic appearance.  HENT:  Head: Normocephalic.  Right Ear: Tympanic membrane and external ear normal.  Left Ear: Tympanic membrane and external ear normal.  Eyes: EOM and lids are normal. Pupils are equal, round, and reactive to light.  Neck: Normal range of motion. Neck supple. Carotid bruit is not present.  Cardiovascular: Normal rate, regular rhythm, normal heart sounds, intact distal pulses and normal pulses.   Tachycardia.     Pulmonary/Chest: Effort normal and breath sounds normal. No respiratory distress. He has no wheezes.  Abdominal: Soft. Bowel sounds are normal. There is no tenderness. There is no guarding.  Pain to the right lower quadrant. Pain in the right upper quadrant. Pain in the epigastric area.   Musculoskeletal: Normal range of motion.  Capillary refill is less than two seconds. No LE edema.   Lymphadenopathy:       Head (right side): No submandibular adenopathy present.       Head (left side): No submandibular adenopathy present.    He has no cervical adenopathy.  Neurological: He is alert and oriented to person, place, and time. He has normal strength. No cranial nerve deficit or sensory deficit.  Skin: Skin is warm and dry.  Psychiatric: He has a normal mood and affect. His speech is normal.  Nursing note and vitals reviewed.    ED Treatments / Results  DIAGNOSTIC STUDIES: Oxygen Saturation is 100% on RA, normal by my interpretation.    COORDINATION OF CARE: 7:33 PM Discussed treatment plan with pt at bedside and pt agreed to plan, which includes CT kidney stone study, Zofran, and pain medication.   Labs (all labs ordered are listed, but only abnormal results are displayed) Labs Reviewed - No data to display  EKG  EKG Interpretation None       Radiology No results found.  Procedures Procedures (including critical care time)  Medications Ordered in ED Medications - No data to display   Initial Impression / Assessment and Plan / ED Course  I have reviewed the triage vital signs and the nursing notes.  Pertinent labs & imaging results that were available during my care of the patient were reviewed by me and considered in my medical decision making (see chart for details).     **I have reviewed nursing notes, vital signs, and all appropriate lab and imaging results for this patient.*  Final Clinical Impressions(s) / ED Diagnoses   MDM: Patient complains of right flank  pain that began earlier today. Pain has become progressively worse. Patient now has uncontrolled pain accompanied by nausea. Patient has a hx of kidney stones. Will obtain UA and a CT stone study, IV pain medication and nausea mediation have been provided.  Recheck - pain down to a 6, but still uncomfortable.Additional IV pain medication given. Urinalysis reveals a cloudy yellow specimen with a specific gravity 1.017. There is a large hemoglobin present. There are 30 mg/daL of protein. Small leukocyte esterase noted. There are too many to count red cells and too many to count white blood cells. A culture of the urine has been sent to the lab. CT stone study reveals 2 obstructing stones within the right ureter that measures 6.5 and 7.5 respectively. There is a moderate right-sided hydronephrosis. There is some atherosclerosis of the aorta. There are 3 punctate nonobstructing left kidney stones, but nothing in the  Ureter.  I discussed the findings of the CT scan in the urine with patient in terms which he understands. Questions were answered. The patient is referred to urology for additional evaluation. The patient will strain all urine. Prescription for Flomax and pain medication given to the patient. He is to return to the emergency department if any emergent changes, problems, or concerns, or difficulty with managing his pain. Final diagnoses:  Renal colic on right side  Kidney stones    New Prescriptions Discharge Medication List as of 07/22/2016 10:34 PM    START taking these medications   Details  meloxicam (MOBIC) 15 MG tablet Take 1 tablet (15 mg total) by mouth daily., Starting Mon 07/22/2016, Print    promethazine (PHENERGAN) 25 MG suppository Place 1 suppository (25 mg total) rectally every 6 (six) hours as needed for nausea or vomiting., Starting Mon 07/22/2016, Print    tamsulosin (FLOMAX) 0.4 MG CAPS capsule Take 1 capsule (0.4 mg total) by mouth at bedtime., Starting Mon 07/22/2016,  Print       **I personally performed the services described in this documentation, which was scribed in my presence. The recorded information has been reviewed and is accurate.Lily Kocher, PA-C 07/25/16 2021    Forde Dandy, MD 07/26/16 [phone]

## 2016-07-22 NOTE — ED Notes (Signed)
Pt with flank pain and believes he has a kidney stone.

## 2016-07-23 DIAGNOSIS — N202 Calculus of kidney with calculus of ureter: Secondary | ICD-10-CM | POA: Diagnosis not present

## 2016-07-24 ENCOUNTER — Other Ambulatory Visit: Payer: Self-pay | Admitting: Urology

## 2016-07-24 ENCOUNTER — Encounter (HOSPITAL_BASED_OUTPATIENT_CLINIC_OR_DEPARTMENT_OTHER): Payer: Self-pay | Admitting: *Deleted

## 2016-07-24 NOTE — Progress Notes (Addendum)
To [facility] at 1300- Istat 8 on arrival-instructed Npo after Mn solids-clear liquids( no dairy,pulp) until 0700,then Npo all-refrain from smoking also.

## 2016-07-25 ENCOUNTER — Encounter (HOSPITAL_BASED_OUTPATIENT_CLINIC_OR_DEPARTMENT_OTHER): Payer: Self-pay

## 2016-07-25 ENCOUNTER — Ambulatory Visit (HOSPITAL_BASED_OUTPATIENT_CLINIC_OR_DEPARTMENT_OTHER): Payer: Commercial Managed Care - HMO | Admitting: Anesthesiology

## 2016-07-25 ENCOUNTER — Encounter (HOSPITAL_COMMUNITY)
Admission: AD | Disposition: A | Discharge status: Home / Self Care | Payer: Self-pay | Source: Ambulatory Visit | Attending: Urology

## 2016-07-25 ENCOUNTER — Ambulatory Visit (HOSPITAL_BASED_OUTPATIENT_CLINIC_OR_DEPARTMENT_OTHER): Admit: 2016-07-25 | Payer: Commercial Managed Care - HMO | Admitting: Urology

## 2016-07-25 ENCOUNTER — Inpatient Hospital Stay (HOSPITAL_BASED_OUTPATIENT_CLINIC_OR_DEPARTMENT_OTHER)
Admission: AD | Admit: 2016-07-25 | Discharge: 2016-07-26 | DRG: 694 | Disposition: A | Discharge status: Home / Self Care | Payer: Commercial Managed Care - HMO | Source: Ambulatory Visit | Attending: Urology | Admitting: Urology

## 2016-07-25 DIAGNOSIS — B957 Other staphylococcus as the cause of diseases classified elsewhere: Secondary | ICD-10-CM | POA: Diagnosis present

## 2016-07-25 DIAGNOSIS — F17210 Nicotine dependence, cigarettes, uncomplicated: Secondary | ICD-10-CM | POA: Diagnosis present

## 2016-07-25 DIAGNOSIS — R000 Tachycardia, unspecified: Secondary | ICD-10-CM | POA: Diagnosis present

## 2016-07-25 DIAGNOSIS — N12 Tubulo-interstitial nephritis, not specified as acute or chronic: Secondary | ICD-10-CM | POA: Diagnosis not present

## 2016-07-25 DIAGNOSIS — G43909 Migraine, unspecified, not intractable, without status migrainosus: Secondary | ICD-10-CM | POA: Diagnosis present

## 2016-07-25 DIAGNOSIS — N201 Calculus of ureter: Principal | ICD-10-CM | POA: Diagnosis present

## 2016-07-25 DIAGNOSIS — Z880 Allergy status to penicillin: Secondary | ICD-10-CM

## 2016-07-25 DIAGNOSIS — R509 Fever, unspecified: Secondary | ICD-10-CM | POA: Diagnosis not present

## 2016-07-25 DIAGNOSIS — Z87442 Personal history of urinary calculi: Secondary | ICD-10-CM | POA: Diagnosis not present

## 2016-07-25 DIAGNOSIS — N200 Calculus of kidney: Secondary | ICD-10-CM | POA: Diagnosis not present

## 2016-07-25 DIAGNOSIS — N132 Hydronephrosis with renal and ureteral calculous obstruction: Secondary | ICD-10-CM | POA: Diagnosis not present

## 2016-07-25 HISTORY — PX: CYSTOSCOPY W/ URETERAL STENT PLACEMENT: SHX1429

## 2016-07-25 HISTORY — DX: Personal history of urinary calculi: Z87.442

## 2016-07-25 LAB — POCT I-STAT, CHEM 8
BUN: 25 mg/dL — AB (ref 6–20)
CALCIUM ION: 1.19 mmol/L (ref 1.15–1.40)
Chloride: 99 mmol/L — ABNORMAL LOW (ref 101–111)
Creatinine, Ser: 1.8 mg/dL — ABNORMAL HIGH (ref 0.61–1.24)
GLUCOSE: 115 mg/dL — AB (ref 65–99)
HCT: 47 % (ref 39.0–52.0)
Hemoglobin: 16 g/dL (ref 13.0–17.0)
Potassium: 4 mmol/L (ref 3.5–5.1)
Sodium: 135 mmol/L (ref 135–145)
TCO2: 24 mmol/L (ref 0–100)

## 2016-07-25 SURGERY — CYSTOSCOPY, WITH RETROGRADE PYELOGRAM AND URETERAL STENT INSERTION
Anesthesia: General | Site: Ureter | Laterality: Right

## 2016-07-25 SURGERY — CYSTOSCOPY, WITH RETROGRADE PYELOGRAM AND URETERAL STENT INSERTION
Anesthesia: General | Laterality: Right

## 2016-07-25 MED ORDER — SODIUM CHLORIDE 0.9 % IR SOLN
Status: DC | PRN
  Administered 2016-07-25: 1000 mL

## 2016-07-25 MED ORDER — FENTANYL CITRATE (PF) 100 MCG/2ML IJ SOLN
INTRAMUSCULAR | Status: AC
  Filled 2016-07-25: qty 2

## 2016-07-25 MED ORDER — DEXAMETHASONE SODIUM PHOSPHATE 10 MG/ML IJ SOLN
INTRAMUSCULAR | Status: AC
  Filled 2016-07-25: qty 1

## 2016-07-25 MED ORDER — MIDAZOLAM HCL 2 MG/2ML IJ SOLN
INTRAMUSCULAR | Status: AC
  Filled 2016-07-25: qty 2

## 2016-07-25 MED ORDER — POTASSIUM CHLORIDE IN NACL 20-0.9 MEQ/L-% IV SOLN
INTRAVENOUS | Status: DC
  Administered 2016-07-25 – 2016-07-26 (×3): via INTRAVENOUS
  Filled 2016-07-25 (×5): qty 1000

## 2016-07-25 MED ORDER — OXYCODONE HCL 5 MG PO TABS
5.0000 mg | ORAL_TABLET | ORAL | Status: DC | PRN
  Administered 2016-07-25 – 2016-07-26 (×6): 5 mg via ORAL
  Filled 2016-07-25 (×7): qty 1

## 2016-07-25 MED ORDER — HYDROMORPHONE HCL 1 MG/ML IJ SOLN
0.5000 mg | INTRAMUSCULAR | Status: DC | PRN
  Administered 2016-07-25 – 2016-07-26 (×6): 1 mg via INTRAVENOUS
  Filled 2016-07-25 (×7): qty 1

## 2016-07-25 MED ORDER — FENTANYL CITRATE (PF) 100 MCG/2ML IJ SOLN
INTRAMUSCULAR | Status: DC | PRN
  Administered 2016-07-25 (×2): 25 ug via INTRAVENOUS
  Administered 2016-07-25 (×2): 50 ug via INTRAVENOUS
  Administered 2016-07-25 (×2): 25 ug via INTRAVENOUS

## 2016-07-25 MED ORDER — MIDAZOLAM HCL 5 MG/5ML IJ SOLN
INTRAMUSCULAR | Status: DC | PRN
  Administered 2016-07-25 (×2): 1 mg via INTRAVENOUS

## 2016-07-25 MED ORDER — DEXAMETHASONE SODIUM PHOSPHATE 4 MG/ML IJ SOLN
INTRAMUSCULAR | Status: DC | PRN
  Administered 2016-07-25: 4 mg via INTRAVENOUS

## 2016-07-25 MED ORDER — LACTATED RINGERS IV SOLN
INTRAVENOUS | Status: DC
  Administered 2016-07-25 (×2): via INTRAVENOUS
  Filled 2016-07-25: qty 1000

## 2016-07-25 MED ORDER — OXYCODONE-ACETAMINOPHEN 5-325 MG PO TABS: 1.0000 | ORAL_TABLET | Freq: Four times a day (QID) | ORAL | 0 refills | Status: DC | PRN

## 2016-07-25 MED ORDER — ONDANSETRON HCL 4 MG/2ML IJ SOLN
4.0000 mg | Freq: Once | INTRAMUSCULAR | Status: DC | PRN
  Filled 2016-07-25: qty 2

## 2016-07-25 MED ORDER — NICOTINE 14 MG/24HR TD PT24
14.0000 mg | MEDICATED_PATCH | Freq: Every day | TRANSDERMAL | Status: DC
  Administered 2016-07-25 – 2016-07-26 (×2): 14 mg via TRANSDERMAL
  Filled 2016-07-25 (×3): qty 1

## 2016-07-25 MED ORDER — GENTAMICIN SULFATE 40 MG/ML IJ SOLN
480.0000 mg | INTRAVENOUS | Status: DC
  Administered 2016-07-26: 480 mg via INTRAVENOUS
  Filled 2016-07-25: qty 12

## 2016-07-25 MED ORDER — SULFAMETHOXAZOLE-TRIMETHOPRIM 800-160 MG PO TABS: 1.0000 | ORAL_TABLET | Freq: Two times a day (BID) | ORAL | 0 refills | Status: DC

## 2016-07-25 MED ORDER — FENTANYL CITRATE (PF) 100 MCG/2ML IJ SOLN
50.0000 ug | Freq: Once | INTRAMUSCULAR | Status: AC
  Administered 2016-07-25 (×2): 50 ug via INTRAVENOUS
  Filled 2016-07-25: qty 1

## 2016-07-25 MED ORDER — PROPOFOL 10 MG/ML IV BOLUS
INTRAVENOUS | Status: AC
  Filled 2016-07-25: qty 20

## 2016-07-25 MED ORDER — LIDOCAINE HCL (CARDIAC) 20 MG/ML IV SOLN
INTRAVENOUS | Status: DC | PRN
  Administered 2016-07-25: 40 mg via INTRAVENOUS

## 2016-07-25 MED ORDER — GENTAMICIN SULFATE 40 MG/ML IJ SOLN
360.0000 mg | Freq: Once | INTRAVENOUS | Status: AC
  Administered 2016-07-25: 360 mg via INTRAVENOUS
  Filled 2016-07-25: qty 9

## 2016-07-25 MED ORDER — FENTANYL CITRATE (PF) 100 MCG/2ML IJ SOLN
50.0000 ug | Freq: Once | INTRAMUSCULAR | Status: DC
  Filled 2016-07-25: qty 1

## 2016-07-25 MED ORDER — IOHEXOL 300 MG/ML  SOLN
INTRAMUSCULAR | Status: DC | PRN
  Administered 2016-07-25: 20 mL

## 2016-07-25 MED ORDER — HYDROMORPHONE HCL 1 MG/ML IJ SOLN
0.2500 mg | INTRAMUSCULAR | Status: DC | PRN
  Administered 2016-07-25 (×3): 0.5 mg via INTRAVENOUS
  Filled 2016-07-25: qty 0.5

## 2016-07-25 MED ORDER — LIDOCAINE 2% (20 MG/ML) 5 ML SYRINGE
INTRAMUSCULAR | Status: AC
  Filled 2016-07-25: qty 5

## 2016-07-25 MED ORDER — MEPERIDINE HCL 25 MG/ML IJ SOLN
6.2500 mg | INTRAMUSCULAR | Status: DC | PRN
  Filled 2016-07-25: qty 1

## 2016-07-25 MED ORDER — ONDANSETRON HCL 4 MG/2ML IJ SOLN
INTRAMUSCULAR | Status: DC | PRN
  Administered 2016-07-25: 4 mg via INTRAVENOUS

## 2016-07-25 MED ORDER — SENNOSIDES-DOCUSATE SODIUM 8.6-50 MG PO TABS
1.0000 | ORAL_TABLET | Freq: Two times a day (BID) | ORAL | Status: DC
  Administered 2016-07-25 – 2016-07-26 (×2): 1 via ORAL
  Filled 2016-07-25 (×3): qty 1

## 2016-07-25 MED ORDER — PROPOFOL 10 MG/ML IV BOLUS
INTRAVENOUS | Status: DC | PRN
  Administered 2016-07-25: 200 mg via INTRAVENOUS
  Administered 2016-07-25: 100 mg via INTRAVENOUS

## 2016-07-25 MED ORDER — ACETAMINOPHEN 500 MG PO TABS
1000.0000 mg | ORAL_TABLET | Freq: Three times a day (TID) | ORAL | Status: DC
  Administered 2016-07-25 – 2016-07-26 (×4): 1000 mg via ORAL
  Filled 2016-07-25 (×5): qty 2

## 2016-07-25 MED ORDER — ONDANSETRON HCL 4 MG/2ML IJ SOLN
INTRAMUSCULAR | Status: AC
  Filled 2016-07-25: qty 2

## 2016-07-25 MED ORDER — GENTAMICIN IN SALINE 1.6-0.9 MG/ML-% IV SOLN
80.0000 mg | INTRAVENOUS | Status: DC
  Filled 2016-07-25: qty 50

## 2016-07-25 MED ORDER — HYDROMORPHONE HCL 2 MG/ML IJ SOLN
INTRAMUSCULAR | Status: AC
  Filled 2016-07-25: qty 1

## 2016-07-25 SURGICAL SUPPLY — 24 items
BAG DRAIN URO-CYSTO SKYTR STRL (DRAIN) ×3 IMPLANT
BASKET DAKOTA 1.9FR 11X120 (BASKET) IMPLANT
BASKET LASER NITINOL 1.9FR (BASKET) IMPLANT
BASKET ZERO TIP NITINOL 2.4FR (BASKET) IMPLANT
CATH INTERMIT  6FR 70CM (CATHETERS) ×3 IMPLANT
CLOTH BEACON ORANGE TIMEOUT ST (SAFETY) ×3 IMPLANT
FIBER LASER TRAC TIP (UROLOGICAL SUPPLIES) IMPLANT
GLOVE BIO SURGEON STRL SZ 6.5 (GLOVE) ×3 IMPLANT
GLOVE BIO SURGEON STRL SZ7.5 (GLOVE) ×3 IMPLANT
GLOVE INDICATOR 6.5 STRL GRN (GLOVE) ×3 IMPLANT
GOWN STRL REUS W/TWL LRG LVL3 (GOWN DISPOSABLE) ×6 IMPLANT
GUIDEWIRE ANG ZIPWIRE 038X150 (WIRE) ×3 IMPLANT
GUIDEWIRE STR DUAL SENSOR (WIRE) ×3 IMPLANT
IV NS 1000ML (IV SOLUTION) ×1
IV NS 1000ML BAXH (IV SOLUTION) ×2 IMPLANT
IV NS IRRIG 3000ML ARTHROMATIC (IV SOLUTION) ×3 IMPLANT
KIT RM TURNOVER CYSTO AR (KITS) ×3 IMPLANT
MANIFOLD NEPTUNE II (INSTRUMENTS) ×3 IMPLANT
NS IRRIG 500ML POUR BTL (IV SOLUTION) ×3 IMPLANT
PACK CYSTO (CUSTOM PROCEDURE TRAY) ×3 IMPLANT
STENT URET 6FRX24 CONTOUR (STENTS) ×3 IMPLANT
SYRINGE 10CC LL (SYRINGE) ×3 IMPLANT
TUBE CONNECTING 12X1/4 (SUCTIONS) ×3 IMPLANT
TUBE FEEDING 8FR 16IN STR KANG (MISCELLANEOUS) ×3 IMPLANT

## 2016-07-25 NOTE — Anesthesia Preprocedure Evaluation (Signed)
Anesthesia Evaluation  Patient identified by MRN, date of birth, ID band Patient awake    Reviewed: Allergy & Precautions, NPO status , Patient's Chart, lab work & pertinent test results  Airway Mallampati: I  TM Distance: >3 FB Neck ROM: Full    Dental   Pulmonary Current Smoker,    Pulmonary exam normal        Cardiovascular Normal cardiovascular exam     Neuro/Psych Anxiety Depression    GI/Hepatic   Endo/Other    Renal/GU      Musculoskeletal   Abdominal   Peds  Hematology   Anesthesia Other Findings   Reproductive/Obstetrics                             Anesthesia Physical Anesthesia Plan  ASA: II  Anesthesia Plan: General   Post-op Pain Management:    Induction: Intravenous  Airway Management Planned: LMA  Additional Equipment:   Intra-op Plan:   Post-operative Plan: Extubation in OR  Informed Consent: I have reviewed the patients History and Physical, chart, labs and discussed the procedure including the risks, benefits and alternatives for the proposed anesthesia with the patient or authorized representative who has indicated his/her understanding and acceptance.     Plan Discussed with: CRNA and Surgeon  Anesthesia Plan Comments:         Anesthesia Quick Evaluation

## 2016-07-25 NOTE — Discharge Instructions (Signed)
1 - You may have urinary urgency (bladder spasms) and bloody urine on / off with stent in place. This is normal. ° °2 - Call MD or go to ER for fever >102, severe pain / nausea / vomiting not relieved by medications, or acute change in medical status ° °

## 2016-07-25 NOTE — Anesthesia Procedure Notes (Signed)
Procedure Name: LMA Insertion Date/Time: 07/25/2016 2:32 PM Performed by: Justice Rocher Pre-anesthesia Checklist: Patient identified, Emergency Drugs available, Suction available and Patient being monitored Patient Re-evaluated:Patient Re-evaluated prior to inductionOxygen Delivery Method: Circle system utilized Preoxygenation: Pre-oxygenation with 100% oxygen Intubation Type: IV induction Ventilation: Mask ventilation without difficulty LMA: LMA inserted LMA Size: 4.0 Number of attempts: 1 Airway Equipment and Method: Bite block Placement Confirmation: positive ETCO2 and breath sounds checked- equal and bilateral Tube secured with: Tape Dental Injury: Teeth and Oropharynx as per pre-operative assessment

## 2016-07-25 NOTE — Transfer of Care (Signed)
Immediate Anesthesia Transfer of Care Note  Patient: John Jones  Procedure(s) Performed: Procedure(s) (LRB): CYSTOSCOPY/RETROGRADE/URETEROSCOPY/HOLMIUM LASER/STENT PLACEMENT (Right)  Patient Location: PACU  Anesthesia Type: General  Level of Consciousness: awake, sedated, patient cooperative and responds to stimulation  Airway & Oxygen Therapy: Patient Spontanous Breathing and Patient connected to face mask oxygen  Post-op Assessment: Report given to PACU RN, Post -op Vital signs reviewed and stable and Patient moving all extremities  Post vital signs: Reviewed and stable  Complications: No apparent anesthesia complications

## 2016-07-25 NOTE — Brief Op Note (Signed)
07/25/2016  2:48 PM  PATIENT:  Sallee Lange Spada  46 y.o. male  PRE-OPERATIVE DIAGNOSIS:  RIGHT URETERAL STONES  POST-OPERATIVE DIAGNOSIS:  RIGHT URETERAL STONES  PROCEDURE:  Procedure(s): CYSTOSCOPY/RETROGRADE/URETEROSCOPY/HOLMIUM LASER/STENT PLACEMENT (Right)  SURGEON:  Surgeon(s) and Role:    * Alexis Frock, MD - Primary  PHYSICIAN ASSISTANT:   ASSISTANTS: none   ANESTHESIA:   general  EBL:  Total I/O In: 500 [I.V.:500] Out: -   BLOOD ADMINISTERED:none  DRAINS: none   LOCAL MEDICATIONS USED:  NONE  SPECIMEN:  Source of Specimen:  Rt renal pelvis urine  DISPOSITION OF SPECIMEN:  microbiology for gram stain and culture  COUNTS:  YES  TOURNIQUET:  * No tourniquets in log *  DICTATION: .Other Dictation: Dictation Number [id number]  PLAN OF CARE: Discharge to home after PACU  PATIENT DISPOSITION:  PACU - hemodynamically stable.   Delay start of Pharmacological VTE agent (>24hrs) due to surgical blood loss or risk of bleeding: yes

## 2016-07-25 NOTE — Anesthesia Postprocedure Evaluation (Addendum)
Anesthesia Post Note  Patient: John Jones  Procedure(s) Performed: Procedure(s) (LRB): CYSTOSCOPY/RETROGRADE/URETEROSCOPY/HOLMIUM LASER/STENT PLACEMENT (Right)  Patient location during evaluation: PACU Anesthesia Type: General Level of consciousness: awake and alert and patient cooperative Pain management: pain level controlled Vital Signs Assessment: post-procedure vital signs reviewed and stable Respiratory status: spontaneous breathing, nonlabored ventilation and respiratory function stable Cardiovascular status: blood pressure returned to baseline and stable Postop Assessment: no signs of nausea or vomiting Anesthetic complications: no       Last Vitals:  Vitals:   07/25/16 1505 07/25/16 1515  BP: 136/87   Pulse: (!) 124   Resp: (!) 28   Temp: (!) 38.2 C (!) 38.5 C    Last Pain:  Vitals:   07/25/16 1318  TempSrc:   PainSc: 8                  Harsimran Westman,E. Iram Astorino

## 2016-07-25 NOTE — H&P (Signed)
John Jones is an 46 y.o. male.    Chief Complaint: Pre-op RIGHT Ureteroscopic Stone Manipulation  HPI:   1 - Recurrent Nephrolithiasis -  Pre 2018 - SWL x 2, MET x several  07/2016 - 46m and 685mright ureteral stones by ER CT 2/8/1275n eval colickly flnak pain. 470HU. Left punctate renal stones as well. UA withtou infecitous parameters. Cr 0.9.   2 - Medical Stone Disease -  Eval 2018: BMP,PTH,Urate - pending; Composition - pending; 24 HR Urines pending   3 - Fever - pt with new fevers overnight op to 101. Tachycardic at presentation to 120s and with malaise.   PMH sig for hand surgery. NO CV disease / blood thinners.   Today " John Jones is seen to proceed with RIGHT ureteroscopic stone manipulation for goal of right side stone free. Most recent UA without infectious parameters.  Unfortunately he is now febrile and tachycardic, some leukocytosis.     Past Medical History:  Diagnosis Date  . Anxiety   . Depression   . History of kidney stones    multiple episodes  . Migraine     Past Surgical History:  Procedure Laterality Date  . CARPAL TUNNEL RELEASE Right 12/08/2014   Procedure: RIGHT CARPAL TUNNEL RELEASE;  Surgeon: FrIran PlanasMD;  [place] [facility] Service: Orthopedics;  Laterality: Right;  . CYSTOSCOPY Right 12/2015   stent placed and removed   . INCISION AND DRAINAGE Right 01/19/2016   Procedure: INCISION AND DRAINAGE RIGHT LONG FINGER;  Surgeon: StCarole CivilMD;  Location: [facility];  Service: Orthopedics;  Laterality: Right;  RIGHT LONG FINGER - PT COMING AT 9:00 FOR LABWORK  . WRIST ARTHROSCOPY Right 12/08/2014   Procedure: RIGHT WRIST ARTHROSCOPY WITH DEBRIDEMENT/OR REPAIR;  Surgeon: FrIran PlanasMD;  [facility] Service: [facility];  Laterality: Right;    History reviewed. No pertinent family history. Social History:  reports that he has been smoking Cigarettes.  He has a 8.00 pack-year smoking history. He has never used smokeless tobacco. He  reports that he drinks alcohol. He reports that he does not use drugs.  Allergies:  Allergies  Allergen Reactions  . Penicillins     Unknown-childhood    No prescriptions prior to admission.    No results found for this or any previous visit (from the past 48 hour(s)). No results found.  Review of Systems  Constitutional: Positive for fever and malaise/fatigue.  HENT: Negative.   Respiratory: Negative.   Cardiovascular: Negative.   Gastrointestinal: Positive for nausea.  Genitourinary: Positive for flank pain and hematuria.  Skin: Negative.   Neurological: Negative.   Endo/Heme/Allergies: Negative.   Psychiatric/Behavioral: Negative.     There were no vitals taken for this visit. Physical Exam  Constitutional: He is oriented to person, place, and time. He appears well-developed.  HENT:  Head: Normocephalic.  Eyes: Pupils are equal, round, and reactive to light.  Neck: Normal range of motion.  Cardiovascular: Normal rate.   Respiratory: Effort normal.  GI: Soft.  Genitourinary:  Genitourinary Comments: Moderate Rt CVAT  Musculoskeletal: Normal range of motion.  Neurological: He is alert and oriented to person, place, and time.  Skin: Skin is warm.  Psychiatric: He has a normal mood and affect. His behavior is normal. Judgment and thought content normal.     Assessment/Plan  Discussed frankly with pt and family that I am now concerned he may have infection above obstruction and rec as little as possible manipulation  today to achieve renal decompression, ideally stent alone as long as we can obtain suitable wire access.   This will require staged approach, but I feel that is clearly most prudent.   He is given 24hr+ interval gent dosing pre-op. Low threshold for post-op admission pending infectious parameters.  Risks, benefits, alternatives, expected peri-op course discussed.   Alexis Frock, MD 07/25/2016, 6:57 AM

## 2016-07-25 NOTE — Progress Notes (Addendum)
Pharmacy Antibiotic Note  PASTOR YELL is a 46 y.o. male admitted on 07/25/2016 with recurrent nephrolithiasis, now s/p laser lithotripsy and stent placement. Initial UA was clear, but patient presents with fever, tachycardia, and leukocytosis at time of procedure. Pharmacy has been consulted for gentamicin dosing. Gent 5 mg/kg x 1 given pre-op.  Plan:  Gentamicin 7 mg/kg IV q24 hr  F/u SCr closely - have ordered for tomorrow. Anticipate improvement with relief of obstruction, but would consider alternative therapy if SCr does not improve in 24 hr.  Height: 5\' 3"  (160 cm) Weight: 178 lb 8 oz (81 kg) IBW/kg (Calculated) : 56.9  Temp (24hrs), Avg:101.1 F (38.4 C), Min:100 F (37.8 C), Max:101.9 F (38.8 C)   Recent Labs Lab 07/25/16 1330  CREATININE 1.80*    Estimated Creatinine Clearance: 48.2 mL/min (by C-G formula based on SCr of 1.8 mg/dL (H)).    Allergies  Allergen Reactions  . Penicillins     Unknown-childhood     Thank you for allowing pharmacy to be a part of this patient's care.  Reuel Boom, PharmD, BCPS Pager: [phone] 07/25/2016, 6:31 PM

## 2016-07-26 ENCOUNTER — Encounter (HOSPITAL_BASED_OUTPATIENT_CLINIC_OR_DEPARTMENT_OTHER): Payer: Self-pay | Admitting: Urology

## 2016-07-26 LAB — CREATININE, SERUM
CREATININE: 1.39 mg/dL — AB (ref 0.61–1.24)
GFR calc Af Amer: >60 mL/min (ref >60)
GFR calc non Af Amer: 59 mL/min — ABNORMAL LOW (ref >60)

## 2016-07-26 NOTE — Op Note (Deleted)
  The note originally documented on this encounter has been moved the the encounter in which it belongs.  

## 2016-07-26 NOTE — Progress Notes (Signed)
1 Day Post-Op  Subjective:  1 - RIGHT Nephrolithiasis - multifocal Rt ureteral stone with refracotry colic by ER CT 99991111. Was going to undergo ureteroscopy but this was changed to stent alone givin clinical picture of infection on 07/25/16.  2 - Pyelonephritis - fevers to 101.9, malaise, tachycardia at presentation for stone surgery 2/22. UCX and [place] obtained and placed on empiric gent per pharmacy.  Today "John Jones" is improving. Some stent colic as expected, fever curve and heart rate trending down.   Objective: Vital signs in last 24 hours: Temp:  [98.4 F (36.9 C)-101.9 F (38.8 C)] 98.8 F (37.1 C) (02/23 0210) Pulse Rate:  [94-124] 94 (02/23 0210) Resp:  [11-28] 18 (02/23 0210) BP: (114-138)/(73-94) 132/94 (02/23 0210) SpO2:  [94 %-99 %] 98 % (02/23 0210) Weight:  [81 kg (178 lb 8 oz)] 81 kg (178 lb 8 oz) (02/22 1259) Last BM Date: 07/24/16  Intake/Output from previous day: 02/22 0701 - 02/23 0700 In: 1736.7 [P.O.:120; I.V.:1616.7] Out: 500 [Urine:500] Intake/Output this shift: Total I/O In: 116.7 [I.V.:116.7] Out: -   General appearance: alert, cooperative, appears stated age and family at bedside Eyes: negative Nose: Nares normal. Septum midline. Mucosa normal. No drainage or sinus tenderness. Throat: lips, mucosa, and tongue normal; teeth and gums normal Neck: supple, symmetrical, trachea midline Back: symmetric, no curvature. ROM normal. No CVA tenderness. Resp: non-labored on room air.  Cardio: Improved regular tachycardia GI: soft, non-tender; bowel sounds normal; no masses,  no organomegaly Male genitalia: normal, mild rt CVAT.  Extremities: extremities normal, atraumatic, no cyanosis or edema Skin: Skin color, texture, turgor normal. No rashes or lesions Lymph nodes: Cervical, supraclavicular, and axillary nodes normal. Neurologic: Grossly normal  Lab Results:   Recent Labs  07/25/16 1330  HGB 16.0  HCT 47.0   BMET  Recent Labs  07/25/16 1330  NA  135  K 4.0  CL 99*  GLUCOSE 115*  BUN 25*  CREATININE 1.80*   PT/INR No results for input(s): LABPROT, INR in the last 72 hours. ABG No results for input(s): PHART, HCO3 in the last 72 hours.  Invalid input(s): PCO2, PO2  Studies/Results: No results found.  Anti-infectives: Anti-infectives    Start     Dose/Rate Route Frequency Ordered Stop   07/26/16 1400  gentamicin (GARAMYCIN) 480 mg in dextrose 5 % 100 mL IVPB     480 mg 112 mL/hr over 60 Minutes Intravenous Every 24 hours 07/25/16 1836     07/25/16 1345  gentamicin (GARAMYCIN) 360 mg in dextrose 5 % 100 mL IVPB     360 mg 109 mL/hr over 60 Minutes Intravenous  Once 07/25/16 1334 07/25/16 1504   07/25/16 1309  gentamicin (GARAMYCIN) IVPB 80 mg  Status:  Discontinued     80 mg 100 mL/hr over 30 Minutes Intravenous 30 min pre-op 07/25/16 1309 07/25/16 1333   07/25/16 0000  sulfamethoxazole-trimethoprim (BACTRIM DS,SEPTRA DS) 800-160 MG tablet     1 tablet Oral 2 times daily 07/25/16 1454        Assessment/Plan:  1 - RIGHT Nephrolithiasis - will need staged surgical approach, request submitted to reschedule for ureteroscopy in few weeks after clears infectious parameters.   2 - Pyelonephritis - maintain current ABX, IVF, pending further CX data.   Will consider DC when afebrile x 12-24 hrs.    [facility], Shakai Dolley 07/26/2016

## 2016-07-26 NOTE — Discharge Summary (Signed)
Physician Discharge Summary  Patient ID: John Jones MRN: [id number] DOB/AGE: Mar 31, 1971 46 y.o.  Admit date: 07/25/2016 Discharge date: 07/26/2016  Admission Diagnoses:  Discharge Diagnoses:  Active Problems:   Ureteral stone with hydronephrosis   Discharged Condition: good  Hospital Course:  Pt admitted 2/22 for fevers after ureteral stenting for right ureteral stone. By the early evening of POD 1 he is afebrile, pain controlled on PO meds, preliminary BCX and UCX negative, and felt to be adequate for discharge.   Consults: None  Significant Diagnostic Studies: labs: Cr 1.3 at discharge  Treatments: antibiotics: gentamycin  Discharge Exam: Blood pressure (!) 137/94, pulse 80, temperature 98.4 F (36.9 C), temperature source Oral, resp. rate 18, height 5\' 3"  (1.6 m), weight 81 kg (178 lb 8 oz), SpO2 98 %. General appearance: alert, cooperative and appears stated age Eyes: negative Nose: Nares normal. Septum midline. Mucosa normal. No drainage or sinus tenderness. Throat: lips, mucosa, and tongue normal; teeth and gums normal Neck: supple, symmetrical, trachea midline Back: symmetric, no curvature. ROM normal. No CVA tenderness. Resp: non labored breathing on room air.  Cardio: Nl rate GI: soft, non-tender; bowel sounds normal; no masses,  no organomegaly Extremities: extremities normal, atraumatic, no cyanosis or edema Pulses: 2+ and symmetric Skin: Skin color, texture, turgor normal. No rashes or lesions Lymph nodes: Cervical, supraclavicular, and axillary nodes normal. Incision/Wound: none  Disposition: 01-Home or Self Care   Allergies as of 07/26/2016      Reactions   Penicillins    Unknown-childhood      Medication List    STOP taking these medications   HYDROcodone-acetaminophen 5-325 MG tablet Commonly known as:  NORCO   meloxicam 15 MG tablet Commonly known as:  MOBIC   tamsulosin 0.4 MG Caps capsule Commonly known as:  FLOMAX     TAKE  these medications   GOODY PM PO Take 1 Package by mouth as needed (headache).   ondansetron 4 mg Tabs tablet Commonly known as:  ZOFRAN 1 po q6h prn n/v.   oxyCODONE-acetaminophen 5-325 MG tablet Commonly known as:  PERCOCET/ROXICET Take 1-2 tablets by mouth every 6 (six) hours as needed for moderate pain or severe pain. Post-operatively What changed:  how much to take  reasons to take this  additional instructions  Another medication with the same name was removed. Continue taking this medication, and follow the directions you see here.   promethazine 25 MG suppository Commonly known as:  PHENERGAN Place 1 suppository (25 mg total) rectally every 6 (six) hours as needed for nausea or vomiting.   promethazine 12.5 MG tablet Commonly known as:  PHENERGAN Take 1 tablet (12.5 mg total) by mouth every 6 (six) hours as needed for nausea or vomiting.   sulfamethoxazole-trimethoprim 800-160 MG tablet Commonly known as:  BACTRIM DS,SEPTRA DS Take 1 tablet by mouth 2 (two) times daily. X 5 days now. Also begin 2 days before next Urology surgery      Follow-up Information    Alexis Frock, MD Follow up.   Specialty:  Urology Why:  Office will call to arrange next stage surgery for stone removal.  Contact information: [place] [address] [phone]           Signed: Alexis Frock 07/26/2016, 5:46 PM

## 2016-07-26 NOTE — Plan of Care (Addendum)
Problem: Pain Managment: Goal: General experience of comfort will improve Outcome: Progressing Pt c/o abdominal pain 5/10. Oxy IR Q 4 prn.  Given. Pain decreased.  Continue.   Addendum:  Later c/o pain 10/10, requesting Dilaudid.   Dilaudid IV and PO Oxy both given.   Will continue to monitor.

## 2016-07-26 NOTE — Op Note (Signed)
NAME:  [name], [name] NO.:  [id number]  MEDICAL RECORD NO.:  [id number]  LOCATION:                                FACILITY:  [facility]  PHYSICIAN:  Alexis Frock, MD     DATE OF BIRTH:  [birth year]  DATE OF PROCEDURE:  07/25/2016                             OPERATIVE REPORT   PREOPERATIVE DIAGNOSIS:  Right multifocal ureteral stones with refractory colic.  POSTOPERATIVE DIAGNOSIS:  Right multifocal ureteral stones with refractory colic plus concerning for early right pyelonephritis.  PROCEDURES: 1. Cystoscopy with right retrograde pyelogram and interpretation. 2. Insertion of right ureteral stent, 6 x 24 Contour, no tether.  ESTIMATED BLOOD LOSS:  Nil.  COMPLICATION:  None.  SPECIMENS:  Right renal pelvis, urine for Gram stain and culture.  FINDINGS: 1. Very mild hydroureteronephrosis with two filling defects in the     right ureter consistent with known stones. 2. Cloudy, but not grossly thick or purulent urine from right kidney     with hydronephrotic drip. 3. Successful placement of right ureteral stent, proximal end in upper     pole, distal end in urinary bladder.  INDICATION:  John Jones is a pleasant 46 year old man who was found on workup of progressive colicky flank pain to have multifocal right ureteral stone.  He has been on trial of medical therapy, but has not passed his stone.  He was evaluated in the office several days ago, at that time, had no infectious parameters.  We discussed options for further management and we agreed upon right ureteroscopic stone manipulation and he presents for this today.  In preoperative evaluation today, he notes progressive malaise, a fever last night to a degrees and he is tachycardic.  This overall picture being highly concerning for possible obstructing pyelonephritis.  Given this, it was clearly felt that this prudent means of management would be renal decompression alone and management of the stones in a  staged fashion, and the patient was counseled towards ureteral stenting alone.  Informed consent was obtained and placed in the medical record.  PROCEDURE IN DETAIL:  The patient being [facility], was verified. Procedure being right ureteral stent placement was confirmed.  Procedure was carried out.  Time-out was performed.  Intravenous antibiotics were administered.  General LMA anesthesia was introduced.  The patient was placed into a low lithotomy position and sterile field was created by prepping and draping the patient's penis, perineum and proximal thighs using iodine.  Next, cystourethroscopy was performed using a rigid cystoscope with offset lens.  Inspection of the anterior and posterior urethra were unremarkable.  Inspection of the urinary bladder revealed no diverticula, calcifications, papillary lesions.  Ureteral orifices appeared singleton bilaterally.  The right ureteral orifice was cannulated with a 6-French end-hole catheter and very gentle right retrograde pyelogram was obtained.  Right retrograde pyelogram demonstrated a single right ureter with a single-system right kidney.  There were two filling defects in the midureter consistent with known stone.  There was mild hydronephrosis above this.  A 0.038 Zip wire was advanced to the level of the upper pole, over which, the open-ended catheter was advanced.  A sample of the hydronephrotic drip from the right kidney was obtained,  this was set aside, labeled as right renal pelvis urine.  It appeared cloudy, but not grossly purulent.  Given the patient's fever, again it was felt that just stenting alone would be most prudent and as such, a new 6 x 24 Contour-type stent was placed over the working wire, using cystoscopic and fluoroscopic guidance, good proximal and distal deployment were noted.  Efflux of urine was seen around into the distal end of the stent and procedure was terminated.  The patient tolerated the  procedure well. There were no immediate periprocedural complications.  The patient was taken to the postanesthesia care unit in stable condition for possible discharge versus admission pending his initial recovery.          ______________________________ Alexis Frock, MD     TM/MEDQ  D:  07/25/2016  T:  07/26/2016  Job:  [id number]

## 2016-07-26 NOTE — Care Management Note (Signed)
Case Management Note  Patient Details  Name: John Jones MRN: [id number] Date of Birth: 11/07/1970  Subjective/Objective: pt admitted with ureteral stone with hydronephrosis                   Action/Plan:Plan to discharge home   Expected Discharge Date:  07/25/16               Expected Discharge Plan:  Home/Self Care  In-House Referral:     Discharge planning Services  CM Consult  Post Acute Care Choice:  NA Choice offered to:  NA  DME Arranged:  N/A DME Agency:  NA  HH Arranged:  NA HH Agency:  NA  Status of Service:  In process, will continue to follow  If discussed at Long Length of Stay Meetings, dates discussed:    Additional CommentsPurcell Mouton, RN 07/26/2016, 11:23 AM

## 2016-07-26 NOTE — Progress Notes (Signed)
Patient discharged @ 1836 in stable condition.  Educated pt regarding meds, follow up appt and signs and symptoms of infection.  Prescriptions given.  Discharge instructions given.  Teach back completed.

## 2016-07-27 LAB — HIV ANTIBODY (ROUTINE TESTING W REFLEX): HIV Screen 4th Generation wRfx: NONREACTIVE

## 2016-07-28 LAB — URINE CULTURE: Culture: >=100000 — AB

## 2016-07-29 ENCOUNTER — Other Ambulatory Visit: Payer: Self-pay | Admitting: Urology

## 2016-07-30 LAB — CULTURE, BLOOD (ROUTINE X 2)
Culture: NO GROWTH
Culture: NO GROWTH

## 2016-07-31 DIAGNOSIS — N200 Calculus of kidney: Secondary | ICD-10-CM | POA: Insufficient documentation

## 2016-07-31 DIAGNOSIS — N201 Calculus of ureter: Secondary | ICD-10-CM | POA: Diagnosis not present

## 2016-07-31 DIAGNOSIS — N132 Hydronephrosis with renal and ureteral calculous obstruction: Secondary | ICD-10-CM | POA: Diagnosis not present

## 2016-07-31 DIAGNOSIS — R8299 Other abnormal findings in urine: Secondary | ICD-10-CM | POA: Diagnosis not present

## 2016-08-05 MED FILL — Ondansetron HCl Tab 4 MG: ORAL | Qty: 4 | Status: AC

## 2016-08-05 MED FILL — Oxycodone w/ Acetaminophen Tab 5-325 MG: ORAL | Qty: 6 | Status: AC

## 2016-08-06 DIAGNOSIS — G4700 Insomnia, unspecified: Secondary | ICD-10-CM | POA: Diagnosis not present

## 2016-08-06 DIAGNOSIS — N202 Calculus of kidney with calculus of ureter: Secondary | ICD-10-CM | POA: Diagnosis not present

## 2016-08-06 DIAGNOSIS — N200 Calculus of kidney: Secondary | ICD-10-CM | POA: Diagnosis not present

## 2016-08-06 DIAGNOSIS — Z466 Encounter for fitting and adjustment of urinary device: Secondary | ICD-10-CM | POA: Diagnosis not present

## 2016-08-06 DIAGNOSIS — N201 Calculus of ureter: Secondary | ICD-10-CM | POA: Diagnosis not present

## 2016-08-15 DIAGNOSIS — I808 Phlebitis and thrombophlebitis of other sites: Secondary | ICD-10-CM | POA: Diagnosis not present

## 2016-08-22 ENCOUNTER — Encounter (HOSPITAL_BASED_OUTPATIENT_CLINIC_OR_DEPARTMENT_OTHER): Payer: Self-pay | Admitting: *Deleted

## 2016-08-23 NOTE — Progress Notes (Signed)
Spoke w/ Marlowe Kays at [facility].  Informed her of note stating wife says that pt  Is planning to cancel. Left message for pt to confirm.  Marlowe Kays  Given Wife's # of record.

## 2016-08-28 ENCOUNTER — Encounter (HOSPITAL_BASED_OUTPATIENT_CLINIC_OR_DEPARTMENT_OTHER): Payer: Self-pay

## 2016-08-28 ENCOUNTER — Ambulatory Visit (HOSPITAL_BASED_OUTPATIENT_CLINIC_OR_DEPARTMENT_OTHER): Admit: 2016-08-28 | Payer: Commercial Managed Care - HMO | Admitting: Urology

## 2016-08-28 HISTORY — DX: Calculus of ureter: N20.1

## 2016-08-28 SURGERY — CYSTOURETEROSCOPY, WITH RETROGRADE PYELOGRAM AND STENT INSERTION
Anesthesia: General | Laterality: Right

## 2016-09-16 DIAGNOSIS — M79601 Pain in right arm: Secondary | ICD-10-CM | POA: Diagnosis not present

## 2016-09-16 DIAGNOSIS — M25421 Effusion, right elbow: Secondary | ICD-10-CM | POA: Diagnosis not present

## 2016-10-23 DIAGNOSIS — H6690 Otitis media, unspecified, unspecified ear: Secondary | ICD-10-CM | POA: Diagnosis not present

## 2016-12-16 NOTE — Anesthesia Postprocedure Evaluation (Deleted)
Anesthesia Post Note  Patient: John Jones  Procedure(s) Performed: Procedure(s) (LRB): CYSTOSCOPY/RETROGRADE/URETEROSCOPY/STENT PLACEMENT (Right)     Anesthesia Post Evaluation  Last Vitals:  Vitals:   07/26/16 0644 07/26/16 1421  BP: 113/82 (!) 137/94  Pulse: 88 80  Resp: 18 18  Temp: 36.7 C 36.9 C    Last Pain:  Vitals:   07/26/16 1732  TempSrc:   PainSc: 3                  Genova Kiner,E. Mertice Uffelman

## 2016-12-16 NOTE — Addendum Note (Signed)
Addendum  created 12/16/16 1549 by Annye Asa, MD   Sign clinical note

## 2016-12-16 NOTE — Addendum Note (Signed)
Addendum  created 12/16/16 1613 by Annye Asa, MD   Delete clinical note, Sign clinical note

## 2017-01-27 DIAGNOSIS — G4733 Obstructive sleep apnea (adult) (pediatric): Secondary | ICD-10-CM | POA: Diagnosis not present

## 2017-01-27 DIAGNOSIS — Z7689 Persons encountering health services in other specified circumstances: Secondary | ICD-10-CM | POA: Diagnosis not present

## 2017-02-14 DIAGNOSIS — G4733 Obstructive sleep apnea (adult) (pediatric): Secondary | ICD-10-CM | POA: Diagnosis not present

## 2017-03-26 DIAGNOSIS — Z0000 Encounter for general adult medical examination without abnormal findings: Secondary | ICD-10-CM | POA: Diagnosis not present

## 2017-03-26 DIAGNOSIS — Z79899 Other long term (current) drug therapy: Secondary | ICD-10-CM | POA: Diagnosis not present

## 2017-04-17 DIAGNOSIS — Z299 Encounter for prophylactic measures, unspecified: Secondary | ICD-10-CM | POA: Diagnosis not present

## 2017-04-17 DIAGNOSIS — M1990 Unspecified osteoarthritis, unspecified site: Secondary | ICD-10-CM | POA: Diagnosis not present

## 2017-04-17 DIAGNOSIS — R7989 Other specified abnormal findings of blood chemistry: Secondary | ICD-10-CM | POA: Diagnosis not present

## 2017-04-22 DIAGNOSIS — G4733 Obstructive sleep apnea (adult) (pediatric): Secondary | ICD-10-CM | POA: Diagnosis not present

## 2017-05-05 ENCOUNTER — Other Ambulatory Visit: Payer: Self-pay | Admitting: Orthopedic Surgery

## 2017-05-05 ENCOUNTER — Encounter: Payer: Self-pay | Admitting: Orthopedic Surgery

## 2017-05-05 ENCOUNTER — Telehealth: Payer: Self-pay | Admitting: Radiology

## 2017-05-05 ENCOUNTER — Ambulatory Visit (INDEPENDENT_AMBULATORY_CARE_PROVIDER_SITE_OTHER): Payer: Commercial Managed Care - HMO

## 2017-05-05 ENCOUNTER — Ambulatory Visit (INDEPENDENT_AMBULATORY_CARE_PROVIDER_SITE_OTHER): Payer: Commercial Managed Care - HMO | Admitting: Orthopedic Surgery

## 2017-05-05 VITALS — BP 148/106 | HR 90 | Ht 65.0 in | Wt 179.0 lb

## 2017-05-05 DIAGNOSIS — M25511 Pain in right shoulder: Secondary | ICD-10-CM

## 2017-05-05 DIAGNOSIS — S43431A Superior glenoid labrum lesion of right shoulder, initial encounter: Secondary | ICD-10-CM | POA: Diagnosis not present

## 2017-05-05 MED ORDER — TRAMADOL-ACETAMINOPHEN 37.5-325 MG PO TABS: 1.0000 | ORAL_TABLET | Freq: Four times a day (QID) | ORAL | 0 refills | Status: DC | PRN

## 2017-05-05 MED ORDER — NABUMETONE 750 MG PO TABS: 750.0000 mg | ORAL_TABLET | Freq: Two times a day (BID) | ORAL | 2 refills | Status: DC

## 2017-05-05 NOTE — Telephone Encounter (Signed)
sch for Dec 11th at 2pm, arrive at 1:45 MR Arthrogram at [facility] called pt to advise left message

## 2017-05-05 NOTE — Addendum Note (Signed)
Addended byCandice Camp on: 05/05/2017 01:17 PM   Modules accepted: Orders

## 2017-05-05 NOTE — Progress Notes (Signed)
NEW PATIENT OFFICE VISIT    Chief Complaint  Patient presents with  . Shoulder Pain    right     46 year old male was injured skateboarding.  He went down and grabbed the edge of a half pipe and his arm went into extreme flexion and extension with acute pain which settled after a week but 1 week later he started having pain which has progressively increased over the posterior aspect of the shoulder joint radiating into the elbow occasionally into the forearm without associated numbness or tingling.  He says he cannot sleep at night when the pain gets worse and he has dull aching pain all day long unrelieved by diclofenac which she is taken for 3 months since the injury occurred.    Review of Systems  Constitutional: Negative for chills, fever and weight loss.  Respiratory: Negative for shortness of breath.   Cardiovascular: Negative for chest pain.  Neurological: Negative for tingling.     Past Medical History:  Diagnosis Date  . Anxiety   . Depression   . History of kidney stones    multiple episodes  . Migraine   . Right ureteral stone     Past Surgical History:  Procedure Laterality Date  . CARPAL TUNNEL RELEASE Right 12/08/2014   Procedure: RIGHT CARPAL TUNNEL RELEASE;  Surgeon: Iran Planas, MD;  Location: [facility];  Service: Orthopedics;  Laterality: Right;  . CYSTOSCOPY Right 12/2015   stent placed and removed   . CYSTOSCOPY W/ URETERAL STENT PLACEMENT Right 07/25/2016   Procedure: CYSTOSCOPY/RETROGRADE/URETEROSCOPY/STENT PLACEMENT;  Surgeon: Alexis Frock, MD;  Location: [facility];  Service: Urology;  Laterality: Right;  . INCISION AND DRAINAGE Right 01/19/2016   Procedure: INCISION AND DRAINAGE RIGHT LONG FINGER;  Surgeon: Carole Civil, MD;  Location: [facility];  Service: Orthopedics;  Laterality: Right;  RIGHT LONG FINGER - PT COMING AT 9:00 FOR LABWORK  . WRIST ARTHROSCOPY Right 12/08/2014   Procedure: RIGHT WRIST ARTHROSCOPY WITH DEBRIDEMENT/OR  REPAIR;  Surgeon: Iran Planas, MD;  Location: [place];  Service: Orthopedics;  Laterality: Right;    History reviewed. No pertinent family history. Social History   Tobacco Use  . Smoking status: Current Every Day Smoker    Packs/day: 1.00    Years: 8.00    Pack years: 8.00    Types: Cigarettes  . Smokeless tobacco: Never Used  Substance Use Topics  . Alcohol use: Yes    Comment: occ  . Drug use: No    Allergies  Allergen Reactions  . Penicillins     Unknown-childhood     No outpatient medications have been marked as taking for the 05/05/17 encounter (Office Visit) with Carole Civil, MD.    BP (!) 148/106   Pulse 90   Ht 5\' 5"  (1.651 m)   Wt 179 lb (81.2 kg)   BMI 29.79 kg/m   Physical Exam  Constitutional: He is oriented to person, place, and time. He appears well-developed and well-nourished.  Vital signs have been reviewed and are stable. Gen. appearance the patient is well-developed and well-nourished with normal grooming and hygiene.   Musculoskeletal:  GAIT IS normal  Neurological: He is alert and oriented to person, place, and time.  Skin: Skin is warm and dry. No erythema.  Psychiatric: He has a normal mood and affect.  Vitals reviewed.   Right Shoulder Exam  Right shoulder exam is normal.  Tenderness  Right shoulder tenderness location: Posterior joint line and anterior joint line.  Range of Motion  The patient has normal right shoulder ROM.  Muscle Strength  The patient has normal right shoulder strength (Strength is normal but he has painful manual muscle testing of the supra). Abduction: 5/5  Internal rotation: 5/5  External rotation: 5/5  Supraspinatus: 5/5  Subscapularis: 5/5  Biceps: 5/5   Tests  Apprehension: negative Hawkins test: negative Impingement: negative  Other  Erythema: absent Sensation: normal Pulse: present  Comments:  Crank test positive as well as Speed and Yergason test for biceps anchor and labral  pathology   Left Shoulder Exam  Left shoulder exam is normal.  Tenderness  The patient is experiencing no tenderness.   Range of Motion  The patient has normal left shoulder ROM.  Muscle Strength  The patient has normal left shoulder strength.  Tests  Apprehension: negative  Other  Erythema: absent Sensation: normal Pulse: present        Physical Exam  Constitutional: He is oriented to person, place, and time. He appears well-developed and well-nourished.  Vital signs have been reviewed and are stable. Gen. appearance the patient is well-developed and well-nourished with normal grooming and hygiene.   Musculoskeletal:  GAIT IS normal  Neurological: He is alert and oriented to person, place, and time.  Skin: Skin is warm and dry. No erythema.  Psychiatric: He has a normal mood and affect.  Vitals reviewed.   X-ray showed no fracture dislocation or acromial impingement pathology  Meds ordered this encounter  Medications  . nabumetone (RELAFEN) 750 MG tablet    Sig: Take 1 tablet (750 mg total) by mouth 2 (two) times daily.    Dispense:  60 tablet    Refill:  2  . traMADol-acetaminophen (ULTRACET) 37.5-325 MG tablet    Sig: Take 1 tablet by mouth every 6 (six) hours as needed.    Dispense:  30 tablet    Refill:  0    Encounter Diagnoses  Name Primary?  . Right shoulder pain, unspecified chronicity Yes  . Tear of right glenoid labrum, initial encounter   . Acute pain of right shoulder      PLAN:    Procedure note the subacromial injection shoulder RIGHT  Verbal consent was obtained to inject the  RIGHT   Shoulder  Timeout was completed to confirm the injection site is a subacromial space of the  RIGHT  shoulder   Medication used Depo-Medrol 40 mg and lidocaine 1% 3 cc  Anesthesia was provided by ethyl chloride  The injection was performed in the RIGHT  posterior subacromial space. After pinning the skin with alcohol and anesthetized the skin with  ethyl chloride the subacromial space was injected using a 20-gauge needle. There were no complications  Sterile dressing was applied.

## 2017-05-13 ENCOUNTER — Ambulatory Visit (HOSPITAL_COMMUNITY): Admission: RE | Admit: 2017-05-13 | Payer: Commercial Managed Care - HMO | Source: Ambulatory Visit

## 2017-05-13 ENCOUNTER — Ambulatory Visit (HOSPITAL_COMMUNITY): Admission: RE | Admit: 2017-05-13 | Payer: 59 | Source: Ambulatory Visit

## 2017-05-19 ENCOUNTER — Telehealth: Payer: Self-pay | Admitting: Radiology

## 2017-05-19 ENCOUNTER — Telehealth: Payer: Self-pay | Admitting: Orthopedic Surgery

## 2017-05-19 ENCOUNTER — Ambulatory Visit (HOSPITAL_COMMUNITY)
Admission: RE | Admit: 2017-05-19 | Discharge: 2017-05-19 | Disposition: A | Discharge status: Home / Self Care | Payer: 59 | Source: Ambulatory Visit | Attending: Orthopedic Surgery | Admitting: Orthopedic Surgery

## 2017-05-19 ENCOUNTER — Encounter (HOSPITAL_COMMUNITY): Payer: Self-pay

## 2017-05-19 DIAGNOSIS — M25511 Pain in right shoulder: Secondary | ICD-10-CM | POA: Insufficient documentation

## 2017-05-19 DIAGNOSIS — S43431A Superior glenoid labrum lesion of right shoulder, initial encounter: Secondary | ICD-10-CM | POA: Insufficient documentation

## 2017-05-19 DIAGNOSIS — S46011A Strain of muscle(s) and tendon(s) of the rotator cuff of right shoulder, initial encounter: Secondary | ICD-10-CM | POA: Diagnosis not present

## 2017-05-19 DIAGNOSIS — M75101 Unspecified rotator cuff tear or rupture of right shoulder, not specified as traumatic: Secondary | ICD-10-CM | POA: Insufficient documentation

## 2017-05-19 MED ORDER — IOPAMIDOL (ISOVUE-300) INJECTION 61%
INTRAVENOUS | Status: AC
  Administered 2017-05-19: 20 mL via INTRAMUSCULAR
  Filled 2017-05-19: qty 30

## 2017-05-19 MED ORDER — POVIDONE-IODINE 10 % EX SOLN
CUTANEOUS | Status: AC
  Filled 2017-05-19: qty 15

## 2017-05-19 MED ORDER — LIDOCAINE HCL (PF) 1 % IJ SOLN
INTRAMUSCULAR | Status: AC
  Administered 2017-05-19: 5 mL
  Filled 2017-05-19: qty 5

## 2017-05-19 MED ORDER — SODIUM CHLORIDE 0.9 % IJ SOLN
INTRAMUSCULAR | Status: AC
  Filled 2017-05-19: qty 10

## 2017-05-19 MED ORDER — GADOBENATE DIMEGLUMINE 529 MG/ML IV SOLN
5.0000 mL | Freq: Once | INTRAVENOUS | Status: AC | PRN
  Administered 2017-05-19: 0.1 mL via INTRAVENOUS

## 2017-05-19 NOTE — Telephone Encounter (Signed)
It was a new voice message from Friday, 05/16/17, after our clinic hours. States they had left a message previously (05/14/17)

## 2017-05-19 NOTE — Telephone Encounter (Signed)
-----   Message from Uvaldo Bristle sent at 05/19/2017  9:49 AM EST ----- Regarding: [facility] states will CX MRI if no Auth by 2pm today,12/17 Amy,  Pt: John Jones, John Jones [[id number]] - per [facility], said shows MRI R/s'd 1x before - said if no AUTH by today, 12/17@2pm , it will be cx'd.  PLEASE CALL [phone]

## 2017-05-19 NOTE — Telephone Encounter (Signed)
Pre-service [facility] left message on voicemail about needing authorization for this patient. Stated he is scheduled to come in today and if they don't receive authorization by 2 pm they will have to cancel his appointment.   Please call: [phone]

## 2017-05-19 NOTE — Telephone Encounter (Signed)
I called his insurance company and There is no authorization required. I have advised pre services and advised of this, patient aware, is this a new message ?

## 2017-05-22 DIAGNOSIS — G4733 Obstructive sleep apnea (adult) (pediatric): Secondary | ICD-10-CM | POA: Diagnosis not present

## 2017-06-03 DIAGNOSIS — E291 Testicular hypofunction: Secondary | ICD-10-CM | POA: Insufficient documentation

## 2017-06-06 DIAGNOSIS — N469 Male infertility, unspecified: Secondary | ICD-10-CM | POA: Insufficient documentation

## 2017-06-06 DIAGNOSIS — E291 Testicular hypofunction: Secondary | ICD-10-CM | POA: Diagnosis not present

## 2017-06-06 DIAGNOSIS — N200 Calculus of kidney: Secondary | ICD-10-CM | POA: Diagnosis not present

## 2017-06-09 ENCOUNTER — Ambulatory Visit: Payer: 59 | Admitting: Orthopedic Surgery

## 2017-06-17 MED ORDER — GADOBENATE DIMEGLUMINE 529 MG/ML IV SOLN
0.0100 mL | Freq: Once | INTRAVENOUS | Status: AC | PRN
  Administered 2017-05-19: 0.01 mL via INTRA_ARTICULAR

## 2017-06-17 MED ORDER — IOPAMIDOL (ISOVUE-300) INJECTION 61%
30.0000 mL | Freq: Once | INTRAVENOUS | Status: AC | PRN
  Administered 2017-05-19: 10 mL

## 2017-06-17 NOTE — Addendum Note (Signed)
Encounter addended by: Annie Paras on: 06/17/2017 10:16 AM  Actions taken: Imaging Exam ended, Order list changed

## 2017-06-17 NOTE — Addendum Note (Signed)
Encounter addended by: Penny Pia on: 06/17/2017 10:28 AM  Actions taken: Imaging Exam ended, Order list changed, MAR administration accepted

## 2017-06-18 ENCOUNTER — Ambulatory Visit: Payer: 59 | Admitting: Orthopedic Surgery

## 2017-06-18 ENCOUNTER — Encounter: Payer: Self-pay | Admitting: Orthopedic Surgery

## 2017-06-18 VITALS — BP 191/121 | HR 82 | Ht 65.0 in | Wt 174.0 lb

## 2017-06-18 DIAGNOSIS — M25511 Pain in right shoulder: Secondary | ICD-10-CM | POA: Diagnosis not present

## 2017-06-18 MED ORDER — TRAMADOL-ACETAMINOPHEN 37.5-325 MG PO TABS: 1.0000 | ORAL_TABLET | Freq: Four times a day (QID) | ORAL | 0 refills | Status: DC | PRN

## 2017-06-18 NOTE — Patient Instructions (Addendum)
Steps to Quit Smoking Smoking tobacco can be bad for your health. It can also affect almost every organ in your body. Smoking puts you and people around you at risk for many serious W[place]-lasting (chronic) diseases. Quitting smoking is hard, but it is one of the best things that you can do for your health. It is never too late to quit. What are the benefits of quitting smoking? When you quit smoking, you lower your risk for getting serious diseases and conditions. They can include:  Lung cancer or lung disease.  Heart disease.  Stroke.  Heart attack.  Not being able to have children (infertility).  Weak bones (osteoporosis) and broken bones (fractures).  If you have coughing, wheezing, and shortness of breath, those symptoms may get better when you quit. You may also get sick less often. If you are pregnant, quitting smoking can help to lower your chances of having a baby of low birth weight. What can I do to help me quit smoking? Talk with your doctor about what can help you quit smoking. Some things you can do (strategies) include:  Quitting smoking totally, instead of slowly cutting back how much you smoke over a period of time.  Going to in-person counseling. You are more likely to quit if you go to many counseling sessions.  Using resources and support systems, such as: ? Online chats with a counselor. ? Phone quitlines. ? Printed self-help materials. ? Support groups or group counseling. ? Text messaging programs. ? Mobile phone apps or applications.  Taking medicines. Some of these medicines may have nicotine in them. If you are pregnant or breastfeeding, do not take any medicines to quit smoking unless your doctor says it is okay. Talk with your doctor about counseling or other things that can help you.  Talk with your doctor about using more than one strategy at the same time, such as taking medicines while you are also going to in-person counseling. This can help make  quitting easier. What things can I do to make it easier to quit? Quitting smoking might feel very hard at first, but there is a lot that you can do to make it easier. Take these steps:  Talk to your family and friends. Ask them to support and encourage you.  Call phone quitlines, reach out to support groups, or work with a counselor.  Ask people who smoke to not smoke around you.  Avoid places that make you want (trigger) to smoke, such as: ? Bars. ? Parties. ? Smoke-break areas at work.  Spend time with people who do not smoke.  Lower the stress in your life. Stress can make you want to smoke. Try these things to help your stress: ? Getting regular exercise. ? Deep-breathing exercises. ? Yoga. ? Meditating. ? Doing a body scan. To do this, close your eyes, focus on one area of your body at a time from head to toe, and notice which parts of your body are tense. Try to relax the muscles in those areas.  Download or buy apps on your mobile phone or tablet that can help you stick to your quit plan. There are many free apps, such as QuitGuide from the CDC (Centers for Disease Control and Prevention). You can find more support from smokefree.gov and other websites.  This information is not intended to replace advice given to you by your health care provider. Make sure you discuss any questions you have with your health care provider. Document Released: 03/16/2009 Document   Revised: 01/16/2016 Document Reviewed: 10/04/2014 Elsevier Interactive Patient Education  2018 [place] have decided to proceed with rotator cuff repair surgery. You have decided not to continue with nonoperative measures such as but not limited to oral medication,   activity modification, physical therapy, or injection.  We will perform a rotator cuff repair. Some of the risks associated with rotator cuff repair include but are not limited to Bleeding Infection Swelling Stiffness Blood  clot Pain Re-tearing of the rotator cuff Failure of the rotator cuff to heal   If you're not comfortable with these risks and would like to continue with nonoperative treatment please let Dr. Aline Brochure know prior to your surgery.

## 2017-06-18 NOTE — Progress Notes (Signed)
Preop-note   Patient ID: John Jones, male   DOB: 1971/05/15, 46 y.o.   MRN: [id number]  Chief Complaint  Patient presents with  . Results    MRI Right Shoulder    47 year old male injured his right shoulder skateboarding he presented to [place] with right shoulder pain we sent him for MRI his MRI shows rotator cuff tear.  He does a lot of heavy lifting at work.  He complains of severe right shoulder pain even at rest even with sleep on either shoulder he has not responded to nonoperative care including oral medication and rest.  He still has the dull aching severe pain over the right shoulder joint which is exacerbated with overhead activity and lifting although he does not have any tingling or locking   No change in review of systems from visit dated December 3 this is been updated today  Review of Systems  Constitutional: Negative for chills, fever and weight loss.  Respiratory: Negative for shortness of breath.   Cardiovascular: Negative for chest pain.  Neurological: Negative for tingling.   No outpatient medications have been marked as taking for the 06/18/17 encounter (Office Visit) with Carole Civil, MD.    Past Medical History:  Diagnosis Date  . Anxiety   . Depression   . History of kidney stones    multiple episodes  . Migraine   . Right ureteral stone      Allergies  Allergen Reactions  . Penicillins     Unknown-childhood    BP (!) 191/121   Pulse 82   Ht 5\' 5"  (1.651 m)   Wt 174 lb (78.9 kg)   BMI 28.96 kg/m    Physical Exam  Constitutional: He is oriented to person, place, and time. He appears well-developed and well-nourished.  Vital signs have been reviewed and are stable. Gen. appearance the patient is well-developed and well-nourished with normal grooming and hygiene.   Musculoskeletal:  Normal gait  Neurological: He is alert and oriented to person, place, and time.  Skin: Skin is warm and dry. No erythema.  Psychiatric: He has a  normal mood and affect.  Vitals reviewed.   Ortho Exam As we noted in prior examinations the right shoulder exam shows tenderness over the posterior and anterior joint line with normal passive range of motion manual muscle testing reveals painful abduction with normal strength in the supraspinatus apprehension Hawkins and impingement signs were negative there is no erythema sensation and pulses were normal crank test and Speed and Yergason test were positive left shoulder exam no tenderness with normal range of motion and strength  Medical decision-making MRI report as follows IMPRESSION: 1. Partial-thickness rim rent tear of the articular surface of the distal supraspinatus tendon. 2. Small focal tear of the posterosuperior aspect of the labrum. 3. Degeneration of the superior labrum without a discrete tear.     Electronically Signed   By: Lorriane Shire M.D.   On: 05/19/2017 13:56   After reviewing the MRI I agree he has a partial-thickness rim rent tear of the supraspinatus tendon the labral tear does not appear to be anything of significance  After reviewing this with him giving him the option of nonoperative versus operative treatment the patient agrees to have shoulder surgery to repair his rotator cuff  He may be out of work for 6 months  He knows he will not recover for a year and he knows he may not be able to return to the same job  The procedure has been fully reviewed with the patient; The risks and benefits of surgery have been discussed and explained and understood. Alternative treatment has also been reviewed, questions were encouraged and answered. The postoperative plan is also been reviewed.  Encounter Diagnosis  Name Primary?  . Right shoulder pain, unspecified chronicity Yes      Meds ordered this encounter  Medications  . traMADol-acetaminophen (ULTRACET) 37.5-325 MG tablet    Sig: Take 1 tablet by mouth every 6 (six) hours as needed.    Dispense:  30  tablet    Refill:  0   Plan is for arthroscopy right shoulder with possible open rotator cuff repair versus arthroscopic cuff repair patient has been warned about smoking and healing  Arther Abbott, MD 06/18/2017 4:25 PM

## 2017-06-22 DIAGNOSIS — G4733 Obstructive sleep apnea (adult) (pediatric): Secondary | ICD-10-CM | POA: Diagnosis not present

## 2017-06-26 ENCOUNTER — Telehealth: Payer: Self-pay | Admitting: Orthopedic Surgery

## 2017-06-26 NOTE — Telephone Encounter (Signed)
Patient called to relay that the medication if possible. States that the Tramadol prescribed on 06/18/17 is not working.  Aware of surgery scheduled 07/03/17.  Pharmacy is [facility], [place].  Please advise.

## 2017-06-26 NOTE — Patient Instructions (Signed)
John Jones  06/26/2017     @PREFPERIOPPHARMACY @   Your procedure is scheduled on 07/03/2017.  Report to [facility] at 8:50 A.M.  Call this number if you have problems the morning of surgery:  [phone]   Remember:  Do not eat food or drink liquids after midnight.  Take these medicines the morning of surgery with A SIP OF WATER Relafen, Tramadol if needed   STOP BC POWDERS   Do not wear jewelry, make-up or nail polish.  Do not wear lotions, powders, or perfumes, or deodorant.  Do not shave 48 hours prior to surgery.  Men may shave face and neck.  Do not bring valuables to the hospital.  [facility] is not responsible for any belongings or valuables.  Contacts, dentures or bridgework may not be worn into surgery.  Leave your suitcase in the car.  After surgery it may be brought to your room.  For patients admitted to the hospital, discharge time will be determined by your treatment team.  Patients discharged the day of surgery will not be allowed to drive home.    Please read over the following fact sheets that you were given. Surgical Site Infection Prevention and Anesthesia Post-op Instructions     PATIENT INSTRUCTIONS POST-ANESTHESIA  IMMEDIATELY FOLLOWING SURGERY:  Do not drive or operate machinery for the first twenty four hours after surgery.  Do not make any important decisions for twenty four hours after surgery or while taking narcotic pain medications or sedatives.  If you develop intractable nausea and vomiting or a severe headache please notify your doctor immediately.  FOLLOW-UP:  Please make an appointment with your surgeon as instructed. You do not need to follow up with anesthesia unless specifically instructed to do so.  WOUND CARE INSTRUCTIONS (if applicable):  Keep a dry clean dressing on the anesthesia/puncture wound site if there is drainage.  Once the wound has quit draining you may leave it open to air.  Generally you should leave the  bandage intact for twenty four hours unless there is drainage.  If the epidural site drains for more than 36-48 hours please call the anesthesia department.  QUESTIONS?:  Please feel free to call your physician or the hospital operator if you have any questions, and they will be happy to assist you.      Surgery for Rotator Cuff Tear The rotator cuff is a group of muscles and connective tissues (tendons) that surround the shoulder joint and keep the upper arm bone (humerus) in the shoulder socket. A tendon is the place on a muscle where it attaches to a bone. Surgery may be done to repair a partial or complete tear in the rotator cuff that cannot be treated by nonsurgical methods. The exact procedure that you have depends on your injury. If you have a partial tear, you may have surgery to reattach a tendon to the humerus. If you have a complete tear, you may have surgery to sew the two sides of the tear back together. Surgery may be done through small incisions using an operating telescope (arthroscope), through a larger (open) incision, or through a combination of both. Tell a health care provider about:  Any allergies you have.  All medicines you are taking, including vitamins, herbs, eye drops, creams, and over-the-counter medicines.  Any problems you or family members have had with anesthetic medicines.  Any blood disorders you have.  Any surgeries you have had.  Any medical conditions you have.  Whether  you are pregnant or may be pregnant. What are the risks? Generally, this is a safe procedure. However, problems may occur, including:  Infection.  Bleeding.  Allergic reactions to medicines or materials used during the procedure.  Damage to nerves, blood vessels, or shoulder muscles.  Permanent loss of full shoulder movement (stiffness).  What happens before the procedure? Staying hydrated Follow instructions from your health care provider about hydration, which may  include:  Up to 2 hours before the procedure - you may continue to drink clear liquids, such as water, clear fruit juice, black coffee, and plain tea.  Eating and drinking restrictions Follow instructions from your health care provider about eating and drinking, which may include:  8 hours before the procedure - stop eating heavy meals or foods such as meat, fried foods, or fatty foods.  6 hours before the procedure - stop eating light meals or foods, such as toast or cereal.  6 hours before the procedure - stop drinking milk or drinks that contain milk.  2 hours before the procedure - stop drinking clear liquids.  Other instructions  Ask your health care provider about: ? Changing or stopping your regular medicines. This is especially important if you are taking diabetes medicines or blood thinners. ? Taking medicines such as aspirin and ibuprofen. These medicines can thin your blood. Do not take these medicines before your procedure if your health care provider instructs you not to.  Plan to have someone take you home from the hospital or clinic.  If you will be going home right after the procedure, plan to have someone with you for 24 hours.  Ask your health care provider how your surgical site will be marked or identified.  You may be given antibiotic medicine to help prevent infection. What happens during the procedure?  To reduce your risk of infection: ? Your health care team will wash or sanitize their hands. ? Your skin will be washed with soap.  An IV tube will be inserted into one of your veins.  You will be given one or more of the following: ? A medicine to help you relax (sedative). ? A medicine to make you fall asleep (general anesthetic). ? A medicine that is injected into an area of your body to numb everything beyond the injection site (regional anesthetic).  Your surgeon will move your shoulder to observe your injury.  If you are having arthroscopic  surgery: ? Small incisions will be made in the front and back of your shoulder. ? An arthroscope will be inserted through these incisions to examine the inside of your shoulder and plan the surgery.  If you are having open surgery, a wider incision will be made in your shoulder.  Some of the muscle covering your shoulder (deltoid) may be moved to expose your rotator cuff.  Bony growths that might interfere with healing will be removed.  Your rotator cuff will be trimmed around the area where it has torn away from your humerus.  If your rotator cuff is completely torn, the split ends will be sewn back together.  Anchoring inserts will be placed into your humerus in the area where the tendon has torn away from the bone.  The torn end of your rotator cuff will be re-attached (anchored) to your humerus using stitches and small screws.  Your incisions will be closed with sutures.  The incision in your skin will be covered with a bandage (dressing) and medicine.  Your arm will be placed  in a sling. The procedure may vary among health care providers and hospitals. What happens after the procedure?  Your blood pressure, heart rate, breathing rate, and blood oxygen level will be monitored often until the medicines you were given have worn off.  You may have some pain. Medicines will be available to help you.  Do not drive for 24 hours if you received a sedative, or until your health care provider approves. This information is not intended to replace advice given to you by your health care provider. Make sure you discuss any questions you have with your health care provider. Document Released: 06/03/2015 Document Revised: 10/26/2015 Document Reviewed: 06/03/2015 Elsevier Interactive Patient Education  [employer].

## 2017-06-27 ENCOUNTER — Encounter (HOSPITAL_COMMUNITY)
Admission: RE | Admit: 2017-06-27 | Discharge: 2017-06-27 | Disposition: A | Discharge status: Home / Self Care | Payer: 59 | Source: Ambulatory Visit | Attending: Orthopedic Surgery | Admitting: Orthopedic Surgery

## 2017-06-27 ENCOUNTER — Other Ambulatory Visit: Payer: Self-pay

## 2017-06-27 ENCOUNTER — Encounter (HOSPITAL_COMMUNITY): Payer: Self-pay

## 2017-06-27 DIAGNOSIS — Z01812 Encounter for preprocedural laboratory examination: Secondary | ICD-10-CM | POA: Insufficient documentation

## 2017-06-27 HISTORY — DX: Sleep apnea, unspecified: G47.30

## 2017-06-27 LAB — CBC WITH DIFFERENTIAL/PLATELET
BASOS ABS: 0 10*3/uL (ref 0.0–0.1)
BASOS PCT: 0 %
Eosinophils Absolute: 0.3 10*3/uL (ref 0.0–0.7)
Eosinophils Relative: 2 %
HCT: 52.5 % — ABNORMAL HIGH (ref 39.0–52.0)
HEMOGLOBIN: 18.1 g/dL — AB (ref 13.0–17.0)
LYMPHS PCT: 19 %
Lymphs Abs: 2.2 10*3/uL (ref 0.7–4.0)
MCH: 32 pg (ref 26.0–34.0)
MCHC: 34.5 g/dL (ref 30.0–36.0)
MCV: 92.9 fL (ref 78.0–100.0)
Monocytes Absolute: 0.8 10*3/uL (ref 0.1–1.0)
Monocytes Relative: 7 %
NEUTROS ABS: 8 10*3/uL — AB (ref 1.7–7.7)
NEUTROS PCT: 72 %
Platelets: 269 10*3/uL (ref 150–400)
RBC: 5.65 MIL/uL (ref 4.22–5.81)
RDW: 12.9 % (ref 11.5–15.5)
WBC: 11.2 10*3/uL — ABNORMAL HIGH (ref 4.0–10.5)

## 2017-06-27 LAB — COMPREHENSIVE METABOLIC PANEL
ALBUMIN: 4.1 g/dL (ref 3.5–5.0)
ALK PHOS: 69 U/L (ref 38–126)
ALT: 16 U/L — AB (ref 17–63)
AST: 15 U/L (ref 15–41)
Anion gap: 13 (ref 5–15)
BILIRUBIN TOTAL: 0.4 mg/dL (ref 0.3–1.2)
BUN: 13 mg/dL (ref 6–20)
CALCIUM: 9.3 mg/dL (ref 8.9–10.3)
CO2: 18 mmol/L — ABNORMAL LOW (ref 22–32)
CREATININE: 0.94 mg/dL (ref 0.61–1.24)
Chloride: 106 mmol/L (ref 101–111)
GFR calc Af Amer: >60 mL/min (ref >60)
GLUCOSE: 109 mg/dL — AB (ref 65–99)
POTASSIUM: 4.3 mmol/L (ref 3.5–5.1)
Sodium: 137 mmol/L (ref 135–145)
TOTAL PROTEIN: 7.6 g/dL (ref 6.5–8.1)

## 2017-06-27 LAB — PROTIME-INR
INR: 1.02
Prothrombin Time: 13.3 seconds (ref 11.4–15.2)

## 2017-06-27 NOTE — Telephone Encounter (Signed)
NO INCREASE IN MEDICATION   USE HEAT  IBUPROFEN AND TRAMADOL

## 2017-06-27 NOTE — Telephone Encounter (Signed)
I called patient to advise  No answer, left message for him to call me back.

## 2017-06-27 NOTE — Telephone Encounter (Signed)
Patient states he is out of Tramadol/Acetaminophen 37.5-325 mgs.   Qty 30   Sig: Take 1 tablet by mouth every 6 (six) hours as needed.  He states he uses [facility] in [place]

## 2017-06-30 NOTE — Telephone Encounter (Signed)
He has asked for a refill of Tramadol

## 2017-07-01 ENCOUNTER — Other Ambulatory Visit: Payer: Self-pay | Admitting: Orthopedic Surgery

## 2017-07-01 DIAGNOSIS — M25519 Pain in unspecified shoulder: Secondary | ICD-10-CM

## 2017-07-01 MED ORDER — TRAMADOL-ACETAMINOPHEN 37.5-325 MG PO TABS: 1.0000 | ORAL_TABLET | Freq: Four times a day (QID) | ORAL | 0 refills | Status: DC | PRN

## 2017-07-02 NOTE — H&P (Signed)
Preop-note    Patient ID: John Jones, male   DOB: 06-12-1970, 47 y.o.   MRN: [id number]       Chief Complaint  Patient presents with  . Results      MRI Right Shoulder      47 year old male injured his right shoulder skateboarding he presented to [place] with right shoulder pain we sent him for MRI his MRI shows rotator cuff tear.  He does a lot of heavy lifting at work.   He complains of severe right shoulder pain even at rest even with sleep on either shoulder he has not responded to nonoperative care including oral medication and rest.   He still has the dull aching severe pain over the right shoulder joint which is exacerbated with overhead activity and lifting although he does not have any tingling or locking     No change in review of systems from visit dated December 3 this is been updated today   Review of Systems  Constitutional: Negative for chills, fever and weight loss.  Respiratory: Negative for shortness of breath.   Cardiovascular: Negative for chest pain.  Neurological: Negative for tingling.    Active Medications  No outpatient medications have been marked as taking for the 06/18/17 encounter (Office Visit) with Carole Civil, MD.            Past Medical History:  Diagnosis Date  . Anxiety    . Depression    . History of kidney stones      multiple episodes  . Migraine    . Right ureteral stone               Allergies  Allergen Reactions  . Penicillins        Unknown-childhood      BP (!) 191/121   Pulse 82   Ht 5\' 5"  (1.651 m)   Wt 174 lb (78.9 kg)   BMI 28.96 kg/m      Physical Exam  Constitutional: He is oriented to person, place, and time. He appears well-developed and well-nourished.  Vital signs have been reviewed and are stable. Gen. appearance the patient is well-developed and well-nourished with normal grooming and hygiene.   Musculoskeletal:  Normal gait  Neurological: He is alert and oriented to person, place, and time.   Skin: Skin is warm and dry. No erythema.  Psychiatric: He has a normal mood and affect.  Vitals reviewed.     Ortho Exam As we noted in prior examinations the right shoulder exam shows tenderness over the posterior and anterior joint line with normal passive range of motion manual muscle testing reveals painful abduction with normal strength in the supraspinatus apprehension Hawkins and impingement signs were negative there is no erythema sensation and pulses were normal crank test and Speed and Yergason test were positive left shoulder exam no tenderness with normal range of motion and strength   Medical decision-making MRI report as follows IMPRESSION: 1. Partial-thickness rim rent tear of the articular surface of the distal supraspinatus tendon. 2. Small focal tear of the posterosuperior aspect of the labrum. 3. Degeneration of the superior labrum without a discrete tear.     Electronically Signed   By: Lorriane Shire M.D.   On: 05/19/2017 13:56   After reviewing the MRI I agree he has a partial-thickness rim rent tear of the supraspinatus tendon the labral tear does not appear to be anything of significance   After reviewing this with him giving him the  option of nonoperative versus operative treatment the patient agrees to have shoulder surgery to repair his rotator cuff   He may be out of work for 6 months   He knows he will not recover for a year and he knows he may not be able to return to the same job   The procedure has been fully reviewed with the patient; The risks and benefits of surgery have been discussed and explained and understood. Alternative treatment has also been reviewed, questions were encouraged and answered. The postoperative plan is also been reviewed.   Encounter Diagnosis  Name Primary?  . Right shoulder pain, unspecified chronicity Yes              Meds ordered this encounter  Medications  . traMADol-acetaminophen (ULTRACET) 37.5-325 MG tablet       Sig: Take 1 tablet by mouth every 6 (six) hours as needed.      Dispense:  30 tablet      Refill:  0    Plan is for arthroscopy right shoulder with possible open rotator cuff repair versus arthroscopic cuff repair patient has been warned about smoking and healing

## 2017-07-03 ENCOUNTER — Ambulatory Visit (HOSPITAL_COMMUNITY): Payer: 59 | Admitting: Anesthesiology

## 2017-07-03 ENCOUNTER — Encounter (HOSPITAL_COMMUNITY): Payer: Self-pay | Admitting: Anesthesiology

## 2017-07-03 ENCOUNTER — Ambulatory Visit (HOSPITAL_COMMUNITY)
Admission: RE | Admit: 2017-07-03 | Discharge: 2017-07-03 | Disposition: A | Discharge status: Home / Self Care | Payer: 59 | Source: Ambulatory Visit | Attending: Orthopedic Surgery | Admitting: Orthopedic Surgery

## 2017-07-03 ENCOUNTER — Other Ambulatory Visit: Payer: Self-pay

## 2017-07-03 ENCOUNTER — Encounter (HOSPITAL_COMMUNITY)
Admission: RE | Disposition: A | Discharge status: Home / Self Care | Payer: Self-pay | Source: Ambulatory Visit | Attending: Orthopedic Surgery

## 2017-07-03 DIAGNOSIS — Y9351 Activity, roller skating (inline) and skateboarding: Secondary | ICD-10-CM | POA: Diagnosis not present

## 2017-07-03 DIAGNOSIS — Z79891 Long term (current) use of opiate analgesic: Secondary | ICD-10-CM | POA: Diagnosis not present

## 2017-07-03 DIAGNOSIS — M75111 Incomplete rotator cuff tear or rupture of right shoulder, not specified as traumatic: Secondary | ICD-10-CM | POA: Diagnosis not present

## 2017-07-03 DIAGNOSIS — M65811 Other synovitis and tenosynovitis, right shoulder: Secondary | ICD-10-CM | POA: Insufficient documentation

## 2017-07-03 DIAGNOSIS — S46011A Strain of muscle(s) and tendon(s) of the rotator cuff of right shoulder, initial encounter: Secondary | ICD-10-CM | POA: Insufficient documentation

## 2017-07-03 DIAGNOSIS — Z87442 Personal history of urinary calculi: Secondary | ICD-10-CM | POA: Diagnosis not present

## 2017-07-03 DIAGNOSIS — Z880 Allergy status to penicillin: Secondary | ICD-10-CM | POA: Insufficient documentation

## 2017-07-03 DIAGNOSIS — M7551 Bursitis of right shoulder: Secondary | ICD-10-CM | POA: Diagnosis not present

## 2017-07-03 DIAGNOSIS — M75101 Unspecified rotator cuff tear or rupture of right shoulder, not specified as traumatic: Secondary | ICD-10-CM | POA: Diagnosis not present

## 2017-07-03 HISTORY — PX: SHOULDER OPEN ROTATOR CUFF REPAIR: SHX2407

## 2017-07-03 HISTORY — PX: SHOULDER ARTHROSCOPY WITH OPEN ROTATOR CUFF REPAIR: SHX6092

## 2017-07-03 SURGERY — ARTHROSCOPY, SHOULDER WITH REPAIR, ROTATOR CUFF, OPEN
Anesthesia: General | Site: Shoulder | Laterality: Right

## 2017-07-03 MED ORDER — EPINEPHRINE PF 1 MG/ML IJ SOLN
INTRAMUSCULAR | Status: AC
  Filled 2017-07-03: qty 4

## 2017-07-03 MED ORDER — CEFAZOLIN SODIUM-DEXTROSE 2-4 GM/100ML-% IV SOLN
2.0000 g | INTRAVENOUS | Status: AC
  Administered 2017-07-03: 2 g via INTRAVENOUS

## 2017-07-03 MED ORDER — BUPIVACAINE-EPINEPHRINE (PF) 0.5% -1:200000 IJ SOLN
INTRAMUSCULAR | Status: AC
  Filled 2017-07-03: qty 60

## 2017-07-03 MED ORDER — HYDROMORPHONE HCL 1 MG/ML IJ SOLN
INTRAMUSCULAR | Status: AC
  Filled 2017-07-03: qty 0.5

## 2017-07-03 MED ORDER — SODIUM CHLORIDE 0.9 % IR SOLN
Status: DC | PRN
  Administered 2017-07-03: 1000 mL

## 2017-07-03 MED ORDER — SODIUM CHLORIDE 0.9 % IR SOLN
Status: DC | PRN
  Administered 2017-07-03 (×4): 1000 mL

## 2017-07-03 MED ORDER — FENTANYL CITRATE (PF) 100 MCG/2ML IJ SOLN
INTRAMUSCULAR | Status: DC | PRN
  Administered 2017-07-03 (×6): 50 ug via INTRAVENOUS

## 2017-07-03 MED ORDER — LABETALOL HCL 5 MG/ML IV SOLN: 10.0000 mg | INTRAVENOUS | Status: DC | PRN

## 2017-07-03 MED ORDER — LABETALOL HCL 5 MG/ML IV SOLN
10.0000 mg | Freq: Once | INTRAVENOUS | Status: AC
  Administered 2017-07-03: 10 mg via INTRAVENOUS

## 2017-07-03 MED ORDER — LABETALOL HCL 5 MG/ML IV SOLN
INTRAVENOUS | Status: AC
  Filled 2017-07-03: qty 4

## 2017-07-03 MED ORDER — IBUPROFEN 800 MG PO TABS: 800.0000 mg | ORAL_TABLET | Freq: Three times a day (TID) | ORAL | 1 refills | Status: DC | PRN

## 2017-07-03 MED ORDER — ROCURONIUM BROMIDE 50 MG/5ML IV SOLN
INTRAVENOUS | Status: AC
  Filled 2017-07-03: qty 1

## 2017-07-03 MED ORDER — CELECOXIB 400 MG PO CAPS
ORAL_CAPSULE | ORAL | Status: AC
  Filled 2017-07-03: qty 1

## 2017-07-03 MED ORDER — IBUPROFEN 800 MG PO TABS
ORAL_TABLET | ORAL | Status: AC
  Filled 2017-07-03: qty 1

## 2017-07-03 MED ORDER — SUGAMMADEX SODIUM 200 MG/2ML IV SOLN
INTRAVENOUS | Status: DC | PRN
  Administered 2017-07-03: 154.2 mg via INTRAVENOUS

## 2017-07-03 MED ORDER — ONDANSETRON HCL 4 MG/2ML IJ SOLN
INTRAMUSCULAR | Status: AC
  Filled 2017-07-03: qty 2

## 2017-07-03 MED ORDER — SUCCINYLCHOLINE CHLORIDE 20 MG/ML IJ SOLN
INTRAMUSCULAR | Status: AC
  Filled 2017-07-03: qty 1

## 2017-07-03 MED ORDER — ROCURONIUM BROMIDE 100 MG/10ML IV SOLN
INTRAVENOUS | Status: DC | PRN
  Administered 2017-07-03 (×2): 10 mg via INTRAVENOUS
  Administered 2017-07-03: 40 mg via INTRAVENOUS

## 2017-07-03 MED ORDER — HYDROCODONE-ACETAMINOPHEN 5-325 MG PO TABS
2.0000 | ORAL_TABLET | Freq: Once | ORAL | Status: AC
  Administered 2017-07-03: 2 via ORAL
  Filled 2017-07-03: qty 2

## 2017-07-03 MED ORDER — LACTATED RINGERS IV SOLN
INTRAVENOUS | Status: DC
  Administered 2017-07-03 (×2): via INTRAVENOUS

## 2017-07-03 MED ORDER — SODIUM CHLORIDE 0.9 % IJ SOLN
INTRAMUSCULAR | Status: AC
  Filled 2017-07-03: qty 10

## 2017-07-03 MED ORDER — CHLORHEXIDINE GLUCONATE 4 % EX LIQD: 60.0000 mL | Freq: Once | CUTANEOUS | Status: DC

## 2017-07-03 MED ORDER — FENTANYL CITRATE (PF) 100 MCG/2ML IJ SOLN
INTRAMUSCULAR | Status: AC
  Filled 2017-07-03: qty 2

## 2017-07-03 MED ORDER — PROPOFOL 10 MG/ML IV BOLUS
INTRAVENOUS | Status: DC | PRN
  Administered 2017-07-03: 150 mg via INTRAVENOUS
  Administered 2017-07-03: 50 mg via INTRAVENOUS

## 2017-07-03 MED ORDER — ONDANSETRON HCL 4 MG/2ML IJ SOLN
4.0000 mg | Freq: Once | INTRAMUSCULAR | Status: AC
  Administered 2017-07-03: 4 mg via INTRAVENOUS

## 2017-07-03 MED ORDER — LIDOCAINE HCL (CARDIAC) 20 MG/ML IV SOLN
INTRAVENOUS | Status: DC | PRN
  Administered 2017-07-03: 50 mg via INTRAVENOUS

## 2017-07-03 MED ORDER — MIDAZOLAM HCL 2 MG/2ML IJ SOLN
INTRAMUSCULAR | Status: AC
  Filled 2017-07-03: qty 2

## 2017-07-03 MED ORDER — HYDROMORPHONE HCL 1 MG/ML IJ SOLN
0.2500 mg | INTRAMUSCULAR | Status: DC | PRN
  Administered 2017-07-03 (×4): 0.5 mg via INTRAVENOUS
  Filled 2017-07-03 (×3): qty 0.5

## 2017-07-03 MED ORDER — MIDAZOLAM HCL 2 MG/2ML IJ SOLN
1.0000 mg | INTRAMUSCULAR | Status: AC
  Administered 2017-07-03 (×2): 2 mg via INTRAVENOUS
  Filled 2017-07-03: qty 2

## 2017-07-03 MED ORDER — PREGABALIN 50 MG PO CAPS
50.0000 mg | ORAL_CAPSULE | Freq: Once | ORAL | Status: AC
  Administered 2017-07-03: 50 mg via ORAL

## 2017-07-03 MED ORDER — HYDROCODONE-ACETAMINOPHEN 7.5-325 MG PO TABS: 1.0000 | ORAL_TABLET | ORAL | 0 refills | Status: DC | PRN

## 2017-07-03 MED ORDER — SUGAMMADEX SODIUM 200 MG/2ML IV SOLN
INTRAVENOUS | Status: AC
  Filled 2017-07-03: qty 2

## 2017-07-03 MED ORDER — PROPOFOL 10 MG/ML IV BOLUS
INTRAVENOUS | Status: AC
  Filled 2017-07-03: qty 20

## 2017-07-03 MED ORDER — LIDOCAINE HCL (PF) 1 % IJ SOLN
INTRAMUSCULAR | Status: AC
  Filled 2017-07-03: qty 5

## 2017-07-03 MED ORDER — CEFAZOLIN SODIUM-DEXTROSE 2-4 GM/100ML-% IV SOLN
INTRAVENOUS | Status: AC
  Filled 2017-07-03: qty 100

## 2017-07-03 MED ORDER — FENTANYL CITRATE (PF) 100 MCG/2ML IJ SOLN
25.0000 ug | Freq: Once | INTRAMUSCULAR | Status: AC
  Administered 2017-07-03: 25 ug via INTRAVENOUS

## 2017-07-03 MED ORDER — CELECOXIB 400 MG PO CAPS
400.0000 mg | ORAL_CAPSULE | Freq: Once | ORAL | Status: AC
  Administered 2017-07-03: 400 mg via ORAL

## 2017-07-03 MED ORDER — BUPIVACAINE-EPINEPHRINE 0.5% -1:200000 IJ SOLN
INTRAMUSCULAR | Status: DC | PRN
  Administered 2017-07-03: 50 mL

## 2017-07-03 MED ORDER — PREGABALIN 50 MG PO CAPS
ORAL_CAPSULE | ORAL | Status: AC
  Filled 2017-07-03: qty 1

## 2017-07-03 MED ORDER — METHOCARBAMOL 1000 MG/10ML IJ SOLN
500.0000 mg | Freq: Once | INTRAVENOUS | Status: AC
  Administered 2017-07-03: 500 mg via INTRAVENOUS
  Filled 2017-07-03: qty 5

## 2017-07-03 MED ORDER — FENTANYL CITRATE (PF) 250 MCG/5ML IJ SOLN
INTRAMUSCULAR | Status: AC
Start: 2017-07-03 — End: ?
  Filled 2017-07-03: qty 5

## 2017-07-03 MED ORDER — EPHEDRINE SULFATE 50 MG/ML IJ SOLN
INTRAMUSCULAR | Status: AC
  Filled 2017-07-03: qty 1

## 2017-07-03 SURGICAL SUPPLY — 90 items
ANCHOR SUT 5.5 SPEEDSCREW (Screw) ×2 IMPLANT
BAG HAMPER (MISCELLANEOUS) ×2 IMPLANT
BENZOIN TINCTURE PRP APPL 2/3 (GAUZE/BANDAGES/DRESSINGS) ×2 IMPLANT
BIT DRILL 2.0X128 (BIT) IMPLANT
BLADE 10 SAFETY STRL DISP (BLADE) ×4 IMPLANT
BLADE 11 SAFETY STRL DISP (BLADE) ×2 IMPLANT
BLADE 15 SAFETY STRL DISP (BLADE) ×2 IMPLANT
BLADE AGGRESSIVE PLUS 4.0 (BLADE) ×2 IMPLANT
BLADE AVERAGE 25X9 (BLADE) IMPLANT
BLADE HEX COATED 2.75 (ELECTRODE) ×2 IMPLANT
BNDG COHESIVE 4X5 TAN STRL (GAUZE/BANDAGES/DRESSINGS) ×2 IMPLANT
BUR 5.0 BARRELL (BURR) IMPLANT
BUR BARRELL 4.0 (BURR) IMPLANT
BUR ROUND 5.0 (BURR) IMPLANT
CANNULA DRILOCK 5.0X75 (CANNULA) ×2 IMPLANT
CANNULA DRILOCK 6.5X75 (CANNULA) IMPLANT
CANNULA DRILOCK 8.0X75 (CANNULA) IMPLANT
CHLORAPREP W/TINT 26ML (MISCELLANEOUS) ×2 IMPLANT
CLOTH BEACON ORANGE TIMEOUT ST (SAFETY) ×2 IMPLANT
CONNECTOR PERFECT PASSER (CONNECTOR) ×2 IMPLANT
COVER LIGHT HANDLE STERIS (MISCELLANEOUS) ×8 IMPLANT
COVER PROBE W GEL 5X96 (DRAPES) ×2 IMPLANT
DECANTER SPIKE VIAL GLASS SM (MISCELLANEOUS) ×4 IMPLANT
DRAPE PROXIMA HALF (DRAPES) ×2 IMPLANT
DRAPE SHOULDER BEACH CHAIR (DRAPES) ×2 IMPLANT
DRAPE U-SHAPE 47X51 STRL (DRAPES) ×2 IMPLANT
DRESSING MEPILEX BORDER 6X8 (GAUZE/BANDAGES/DRESSINGS) ×1 IMPLANT
DRSG MEPILEX BORDER 4X8 (GAUZE/BANDAGES/DRESSINGS) ×2 IMPLANT
DRSG MEPILEX BORDER 6X8 (GAUZE/BANDAGES/DRESSINGS) ×2
ELECT REM PT RETURN 9FT ADLT (ELECTROSURGICAL) ×2
ELECTRODE REM PT RTRN 9FT ADLT (ELECTROSURGICAL) ×1 IMPLANT
FIBERSTICK 2 (SUTURE) IMPLANT
GAUZE SPONGE 4X4 16PLY XRAY LF (GAUZE/BANDAGES/DRESSINGS) ×2 IMPLANT
GLOVE BIO SURGEON STRL SZ7 (GLOVE) ×4 IMPLANT
GLOVE BIOGEL PI IND STRL 7.0 (GLOVE) ×3 IMPLANT
GLOVE BIOGEL PI INDICATOR 7.0 (GLOVE) ×3
GLOVE SKINSENSE NS SZ8.0 LF (GLOVE) ×1
GLOVE SKINSENSE STRL SZ8.0 LF (GLOVE) ×1 IMPLANT
GLOVE SS N UNI LF 8.5 STRL (GLOVE) ×2 IMPLANT
GOWN STRL REUS W/TWL LRG LVL3 (GOWN DISPOSABLE) ×8 IMPLANT
GOWN STRL REUS W/TWL XL LVL3 (GOWN DISPOSABLE) ×2 IMPLANT
IMMOBILIZER SHOULDER LGE (ORTHOPEDIC SUPPLIES) ×2 IMPLANT
INST SET MINOR BONE (KITS) ×2 IMPLANT
IV NS IRRIG 3000ML ARTHROMATIC (IV SOLUTION) ×8 IMPLANT
KIT BLADEGUARD II DBL (SET/KITS/TRAYS/PACK) ×2 IMPLANT
KIT POSITION SHOULDER SCHLEI (MISCELLANEOUS) ×2 IMPLANT
KIT ROOM TURNOVER APOR (KITS) ×2 IMPLANT
MANIFOLD NEPTUNE II (INSTRUMENTS) ×2 IMPLANT
MARKER SKIN DUAL TIP RULER LAB (MISCELLANEOUS) ×2 IMPLANT
NEEDLE HYPO 21X1.5 SAFETY (NEEDLE) ×2 IMPLANT
NEEDLE MAYO 6 CRC TAPER PT (NEEDLE) IMPLANT
NEEDLE SCORPION (NEEDLE) IMPLANT
NEEDLE SPNL 18GX3.5 QUINCKE PK (NEEDLE) ×2 IMPLANT
NS IRRIG 1000ML POUR BTL (IV SOLUTION) ×2 IMPLANT
PACK BASIC III (CUSTOM PROCEDURE TRAY) ×1
PACK SRG BSC III STRL LF ECLPS (CUSTOM PROCEDURE TRAY) ×1 IMPLANT
PAD ARMBOARD 7.5X6 YLW CONV (MISCELLANEOUS) ×2 IMPLANT
PASSER SUT CAPTURE FIRST (SUTURE) IMPLANT
PENCIL HANDSWITCHING (ELECTRODE) ×2 IMPLANT
PROBE BIPOLAR ATHRO 135MM 90D (MISCELLANEOUS) ×2 IMPLANT
RASP SM TEAR CROSS CUT (RASP) IMPLANT
SET ARTHROSCOPY INST (INSTRUMENTS) ×2 IMPLANT
SET BASIN LINEN APH (SET/KITS/TRAYS/PACK) ×2 IMPLANT
SPONGE LAP 18X18 X RAY DECT (DISPOSABLE) ×2 IMPLANT
STOCKINETTE IMPERVIOUS LG (DRAPES) ×2 IMPLANT
STRIP CLOSURE SKIN 1/2X4 (GAUZE/BANDAGES/DRESSINGS) ×2 IMPLANT
SUT BONE WAX W31G (SUTURE) IMPLANT
SUT ETHIBOND NAB OS 4 #2 30IN (SUTURE) IMPLANT
SUT FIBERWIRE #2 38 REV NDL BL (SUTURE)
SUT FIBERWIRE #2 38 T-5 BLUE (SUTURE)
SUT LASSO 45 DEGREE (SUTURE) IMPLANT
SUT LASSO 45 DEGREE LEFT (SUTURE) IMPLANT
SUT LASSO 45D RIGHT (SUTURE) IMPLANT
SUT MON AB 0 CT1 (SUTURE) ×2 IMPLANT
SUT MON AB 2-0 CT1 36 (SUTURE) IMPLANT
SUT PROLENE 2 0 FS (SUTURE) IMPLANT
SUT PROLENE 2 0 SH 30 (SUTURE) IMPLANT
SUT PROLENE 3 0 PS 1 (SUTURE) IMPLANT
SUT SMART STITCH CARTRIDGE (SUTURE) ×4 IMPLANT
SUT VIC AB 1 CT1 27 (SUTURE)
SUT VIC AB 1 CT1 27XBRD ANTBC (SUTURE) IMPLANT
SUTURE FIBERWR #2 38 T-5 BLUE (SUTURE) IMPLANT
SUTURE FIBERWR#2 38 REV NDL BL (SUTURE) IMPLANT
SYR 30ML LL (SYRINGE) ×2 IMPLANT
SYR BULB IRRIGATION 50ML (SYRINGE) ×4 IMPLANT
TOWEL OR 17X26 4PK STRL BLUE (TOWEL DISPOSABLE) ×2 IMPLANT
TUBING ARTHROSCOPY INFLOW/OUT (IRRIGATION / IRRIGATOR) ×2 IMPLANT
WAND 90 DEG TURBOVAC W/CORD (SURGICAL WAND) ×2 IMPLANT
YANKAUER SUCT 12FT TUBE ARGYLE (SUCTIONS) ×2 IMPLANT
YANKAUER SUCT BULB TIP 10FT TU (MISCELLANEOUS) ×4 IMPLANT

## 2017-07-03 NOTE — Anesthesia Preprocedure Evaluation (Signed)
Anesthesia Evaluation  Patient identified by MRN, date of birth, ID band Patient awake    Reviewed: Allergy & Precautions, NPO status , Patient's Chart, lab work & pertinent test results  Airway Mallampati: II  TM Distance: >3 FB     Dental  (+) Teeth Intact   Pulmonary sleep apnea , Current Smoker,    breath sounds clear to auscultation       Cardiovascular hypertension (borderline hx, no meds), negative cardio ROS   Rhythm:Regular Rate:Normal     Neuro/Psych  Headaches, PSYCHIATRIC DISORDERS Anxiety Depression    GI/Hepatic negative GI ROS, Neg liver ROS,   Endo/Other  negative endocrine ROS  Renal/GU Renal disease     Musculoskeletal negative musculoskeletal ROS (+)   Abdominal   Peds  Hematology negative hematology ROS (+)   Anesthesia Other Findings   Reproductive/Obstetrics                             Anesthesia Physical Anesthesia Plan  ASA: II  Anesthesia Plan: General   Post-op Pain Management:    Induction: Intravenous  PONV Risk Score and Plan:   Airway Management Planned: Oral ETT  Additional Equipment:   Intra-op Plan:   Post-operative Plan: Extubation in OR  Informed Consent: I have reviewed the patients History and Physical, chart, labs and discussed the procedure including the risks, benefits and alternatives for the proposed anesthesia with the patient or authorized representative who has indicated his/her understanding and acceptance.     Plan Discussed with:   Anesthesia Plan Comments:         Anesthesia Quick Evaluation

## 2017-07-03 NOTE — Op Note (Signed)
07/03/2017  11:45 AM  PATIENT:  John Jones  47 y.o. male  PRE-OPERATIVE DIAGNOSIS:  Rotator cuff tear right shoulder  POST-OPERATIVE DIAGNOSIS: Partial articular surface tendon avulsion rotator cuff tear supraspinatus right shoulder  PROCEDURE:  Procedure(s): SHOULDER ARTHROSCOPY (Right) ROTATOR CUFF REPAIR SHOULDER OPEN (Right)   Undersurface tear rotator cuff supra spinatus tendon articular surface tendon avulsion pasta type tear  Remaining glenohumeral joint showed synovitis, subacromial space bursitis moderate  1 ArthroCare speed screw was placed  Details of procedure  The patient identified in the preop area surgical site was confirmed and marked blood pressure was controlled patient was taken to surgery he started on Ancef tolerated that well.  He was intubated.  He was placed in the modified beachchair position for shoulder arthroscopy  Sterile prep and drape was completed  Timeout was completed  Standard posterior portal was established scope was placed in the joint diagnostic arthroscopy was performed  Anterior portal was established  Spinal needle was placed through the cuff tear followed by a PDS suture which was brought out through the anterior portal  The scope was then placed into the subacromial space where bursectomy, limited debridement was performed  The tear was not visible other than the suture that was placed from the bursal surface  The scope was removed and an anterolateral incision was made off the edge of the acromion the subtenons tissue was divided down to the deltoid fascia.  The deltoid was split up to the acromion but not detached  A bursectomy was performed for further visualization  The suture that was placed was used to mark the tear.  The remaining fibers were removed from the greater tuberosity debrided and then a Smart stitch was passed and then a speed screw was passed and an excellent repair was obtained  Full range of motion  was obtainable after the repair the wound was irrigated and closed with 0 Monocryl for the deltoid split 0 Monocryl in 2-0 Monocryl for the subcutaneous tissue  The remaining portals were closed with 2-0 Monocryl  Steri-Strips were applied and the subacromial space was injected with Marcaine with epinephrine  Sterile bandage was applied and the patient was placed in a shoulder immobilizer  SURGEON:  Surgeon(s) and Role:    Carole Civil, MD - Primary  PHYSICIAN ASSISTANT:   ASSISTANTS: betty ashley   ANESTHESIA:   general  EBL:  25 mL   BLOOD ADMINISTERED:none  DRAINS: none   LOCAL MEDICATIONS USED:  MARCAINE     SPECIMEN:  No Specimen  DISPOSITION OF SPECIMEN:  N/A  COUNTS:  YES  TOURNIQUET:  * No tourniquets in log *  DICTATION: .Dragon Dictation  PLAN OF CARE: Discharge to home after PACU  PATIENT DISPOSITION:  PACU - hemodynamically stable.   Delay start of Pharmacological VTE agent (>24hrs) due to surgical blood loss or risk of bleeding: not applicable  [id number] for diagnostic arthroscopy with limited debridement involving the bursectomy done for the subacromial space

## 2017-07-03 NOTE — Anesthesia Procedure Notes (Signed)
Procedure Name: Intubation Date/Time: 07/03/2017 9:56 AM Performed by: Andree Elk, Amy A, CRNA Pre-anesthesia Checklist: Patient identified, Patient being monitored, Timeout performed, Emergency Drugs available and Suction available Patient Re-evaluated:Patient Re-evaluated prior to induction Oxygen Delivery Method: Circle System Utilized Preoxygenation: Pre-oxygenation with 100% oxygen Induction Type: IV induction Ventilation: Mask ventilation without difficulty Laryngoscope Size: Miller and 3 Grade View: Grade I Tube type: Oral Tube size: 7.0 mm Number of attempts: 1 Airway Equipment and Method: Stylet Placement Confirmation: ETT inserted through vocal cords under direct vision,  positive ETCO2 and breath sounds checked- equal and bilateral Secured at: 21 cm Tube secured with: Tape Dental Injury: Teeth and Oropharynx as per pre-operative assessment

## 2017-07-03 NOTE — Interval H&P Note (Signed)
History and Physical Interval Note:  07/03/2017 9:50 AM BP 136/81   Pulse 81   Temp 97.8 F (36.6 C) (Oral)   Resp (!) 9   SpO2 96%  bp ok now  Sallee Lange Eustache  has presented today for surgery, with the diagnosis of Rotator cuff tear right shoulder  The various methods of treatment have been discussed with the patient and family. After consideration of risks, benefits and other options for treatment, the patient has consented to  Procedure(s): SHOULDER ARTHROSCOPY WITH POSSIBLE OPEN ROTATOR CUFF REPAIR VS ARTHROSCOPIC ROTATOR CUFF REPAIR (Right) as a surgical intervention .  The patient's history has been reviewed, patient examined, no change in status, stable for surgery.  I have reviewed the patient's chart and labs.  Questions were answered to the patient's satisfaction.     Arther Abbott

## 2017-07-03 NOTE — Discharge Instructions (Signed)
PATIENT INSTRUCTIONS POST-ANESTHESIA  IMMEDIATELY FOLLOWING SURGERY:  Do not drive or operate machinery for the first twenty four hours after surgery.  Do not make any important decisions for twenty four hours after surgery or while taking narcotic pain medications or sedatives.  If you develop intractable nausea and vomiting or a severe headache please notify your doctor immediately.  FOLLOW-UP:  Please make an appointment with your surgeon as instructed. You do not need to follow up with anesthesia unless specifically instructed to do so.  WOUND CARE INSTRUCTIONS (if applicable):  Keep a dry clean dressing on the anesthesia/puncture wound site if there is drainage.  Once the wound has quit draining you may leave it open to air.  Generally you should leave the bandage intact for twenty four hours unless there is drainage.  If the epidural site drains for more than 36-48 hours please call the anesthesia department.  QUESTIONS?:  Please feel free to call your physician or the hospital operator if you have any questions, and they will be happy to assist you.        PLEASE FOLLOW UP WITH YOUR PRIMARY CARE DOCTOR IN REGARDS TO BLOOD PRESSURE READINGS ELEVATED TODAY

## 2017-07-03 NOTE — Brief Op Note (Signed)
07/03/2017  11:45 AM  PATIENT:  John Jones  47 y.o. male  PRE-OPERATIVE DIAGNOSIS:  Rotator cuff tear right shoulder  POST-OPERATIVE DIAGNOSIS: Partial articular surface tendon avulsion rotator cuff tear supraspinatus right shoulder  PROCEDURE:  Procedure(s): SHOULDER ARTHROSCOPY (Right) ROTATOR CUFF REPAIR SHOULDER OPEN (Right)   Undersurface tear rotator cuff supra spinatus tendon articular surface tendon avulsion pasta type tear  Remaining glenohumeral joint showed synovitis, subacromial space bursitis moderate  1 ArthroCare speed screw was placed  Details of procedure  The patient identified in the preop area surgical site was confirmed and marked blood pressure was controlled patient was taken to surgery he started on Ancef tolerated that well.  He was intubated.  He was placed in the modified beachchair position for shoulder arthroscopy  Sterile prep and drape was completed  Timeout was completed  Standard posterior portal was established scope was placed in the joint diagnostic arthroscopy was performed  Anterior portal was established  Spinal needle was placed through the cuff tear followed by a PDS suture which was brought out through the anterior portal  The scope was then placed into the subacromial space where bursectomy, limited debridement was performed  The tear was not visible other than the suture that was placed from the bursal surface  The scope was removed and an anterolateral incision was made off the edge of the acromion the subtenons tissue was divided down to the deltoid fascia.  The deltoid was split up to the acromion but not detached  A bursectomy was performed for further visualization  The suture that was placed was used to mark the tear.  The remaining fibers were removed from the greater tuberosity debrided and then a Smart stitch was passed and then a speed screw was passed and an excellent repair was obtained  Full range of motion  was obtainable after the repair the wound was irrigated and closed with 0 Monocryl for the deltoid split 0 Monocryl in 2-0 Monocryl for the subcutaneous tissue  The remaining portals were closed with 2-0 Monocryl  Steri-Strips were applied and the subacromial space was injected with Marcaine with epinephrine  Sterile bandage was applied and the patient was placed in a shoulder immobilizer  SURGEON:  Surgeon(s) and Role:    Carole Civil, MD - Primary  PHYSICIAN ASSISTANT:   ASSISTANTS: betty ashley   ANESTHESIA:   general  EBL:  25 mL   BLOOD ADMINISTERED:none  DRAINS: none   LOCAL MEDICATIONS USED:  MARCAINE     SPECIMEN:  No Specimen  DISPOSITION OF SPECIMEN:  N/A  COUNTS:  YES  TOURNIQUET:  * No tourniquets in log *  DICTATION: .Dragon Dictation  PLAN OF CARE: Discharge to home after PACU  PATIENT DISPOSITION:  PACU - hemodynamically stable.   Delay start of Pharmacological VTE agent (>24hrs) due to surgical blood loss or risk of bleeding: not applicable  [id number] for diagnostic arthroscopy with limited debridement involving the bursectomy done for the subacromial space

## 2017-07-03 NOTE — Transfer of Care (Signed)
Immediate Anesthesia Transfer of Care Note  Patient: John Jones  Procedure(s) Performed: SHOULDER ARTHROSCOPY (Right Shoulder) ROTATOR CUFF REPAIR SHOULDER OPEN (Right Shoulder)  Patient Location: PACU  Anesthesia Type:General  Level of Consciousness: drowsy and patient cooperative  Airway & Oxygen Therapy: Patient Spontanous Breathing and Patient connected to face mask oxygen  Post-op Assessment: Report given to RN and Post -op Vital signs reviewed and stable  Post vital signs: Reviewed and stable  Last Vitals:  Vitals:   07/03/17 1158 07/03/17 1200  BP: 122/69 122/69  Pulse: 88 86  Resp: 19 19  Temp: 36.6 C   SpO2: 96% 100%    Last Pain:  Vitals:   07/03/17 1158  TempSrc:   PainSc: (P) Asleep      Patients Stated Pain Goal: 7 (24/23/53 6144)  Complications: No apparent anesthesia complications

## 2017-07-03 NOTE — Interval H&P Note (Signed)
History and Physical Interval Note:  07/03/2017 9:29 AM  BP (!) 143/102   Pulse 81   Temp 97.8 F (36.6 C) (Oral)   Resp 14   SpO2 96%   Monitoring bp, labetolol given, re check in a few minutes   John Jones  has presented today for surgery, with the diagnosis of Rotator cuff tear right shoulder  The various methods of treatment have been discussed with the patient and family. After consideration of risks, benefits and other options for treatment, the patient has consented to  Procedure(s): SHOULDER ARTHROSCOPY WITH POSSIBLE OPEN ROTATOR CUFF REPAIR VS ARTHROSCOPIC ROTATOR CUFF REPAIR (Right) as a surgical intervention .  The patient's history has been reviewed, patient examined, no change in status, stable for surgery.  I have reviewed the patient's chart and labs.  Questions were answered to the patient's satisfaction.     Arther Abbott

## 2017-07-03 NOTE — Anesthesia Postprocedure Evaluation (Signed)
Anesthesia Post Note  Patient: John Jones  Procedure(s) Performed: SHOULDER ARTHROSCOPY (Right Shoulder) ROTATOR CUFF REPAIR SHOULDER OPEN (Right Shoulder)  Patient location during evaluation: PACU Anesthesia Type: General Level of consciousness: awake and alert, oriented and patient cooperative Pain management: pain level controlled Vital Signs Assessment: post-procedure vital signs reviewed and stable Respiratory status: spontaneous breathing and respiratory function stable Cardiovascular status: stable Postop Assessment: no apparent nausea or vomiting Anesthetic complications: no     Last Vitals:  Vitals:   07/03/17 1200 07/03/17 1215  BP: 122/69 123/71  Pulse: 86 96  Resp: 19 (!) 21  Temp:    SpO2: 100% 100%    Last Pain:  Vitals:   07/03/17 1215  TempSrc:   PainSc: 9                  Oseph Imburgia A

## 2017-07-04 ENCOUNTER — Telehealth: Payer: Self-pay | Admitting: Orthopedic Surgery

## 2017-07-04 MED ORDER — CYCLOBENZAPRINE HCL 5 MG PO TABS: 5.0000 mg | ORAL_TABLET | Freq: Three times a day (TID) | ORAL | 0 refills | Status: DC | PRN

## 2017-07-04 MED ORDER — GABAPENTIN 100 MG PO CAPS: 100.0000 mg | ORAL_CAPSULE | Freq: Three times a day (TID) | ORAL | 0 refills | Status: DC

## 2017-07-04 NOTE — Telephone Encounter (Signed)
Patient left message on voicemail asking if Dr. Aline Brochure would give him something stronger for the pain. He has been up all night.  Please call and advise

## 2017-07-04 NOTE — Telephone Encounter (Signed)
I spoke at length with patient and Dr Aline Brochure. Advised we will send in meds for him, to use in addition to the Hydrocodone. He and his wife both were on the phone demanding stronger medications. Dr Aline Brochure has had me advise them the 7.5 mg Hydrocodone is the strongest he can prescribe. I have advised use ice full time, use the muscle relaxer and gabapentin we are sending in, and take the Hydrocodone 7.5 as prescribed ONE every four hours. Wife states she gave him two, I have instructed her not to do this, also advised to use the Ibuprofen every 8 hours as well.   Wife was very angry over the phone and patient was yelling as well.   Dr Aline Brochure is aware.

## 2017-07-07 ENCOUNTER — Telehealth: Payer: Self-pay | Admitting: Orthopedic Surgery

## 2017-07-07 ENCOUNTER — Other Ambulatory Visit: Payer: Self-pay | Admitting: Orthopedic Surgery

## 2017-07-07 MED ORDER — HYDROCODONE-ACETAMINOPHEN 10-325 MG PO TABS: 1.0000 | ORAL_TABLET | ORAL | 0 refills | Status: DC | PRN

## 2017-07-07 NOTE — Telephone Encounter (Signed)
Patient called, patient's wife also on phone, requesting refill on pain medication,  HYDROcodone-acetaminophen (NORCO) 7.5-325 MG tablet 30 tablet    as "in severe pain"  - which I (1) relayed protocol regarding patient signing a designated party form authorizing his wife to receive his health information,       and (2) I mentioned that it appears that this was addressed on Friday, 07/04/17.  Please advise.

## 2017-07-07 NOTE — Telephone Encounter (Signed)
I want to see him tomorrow afternoon

## 2017-07-07 NOTE — Telephone Encounter (Signed)
Thank you, no stronger narcotics per Dr Aline Brochure, patient has been advised.   He was advised to ice, use Gabapentin, Flexeril, Ibuprofen, Hydrocodone ONE every 4 hrs prn only.

## 2017-07-07 NOTE — Telephone Encounter (Signed)
To you FYI only, they continue to call.

## 2017-07-08 DIAGNOSIS — Z9889 Other specified postprocedural states: Secondary | ICD-10-CM | POA: Insufficient documentation

## 2017-07-08 NOTE — Telephone Encounter (Signed)
Called patient this morning, 07/08/17; appointment scheduled per Dr Ruthe Mannan response.

## 2017-07-09 ENCOUNTER — Ambulatory Visit (INDEPENDENT_AMBULATORY_CARE_PROVIDER_SITE_OTHER): Payer: 59 | Admitting: Orthopedic Surgery

## 2017-07-09 ENCOUNTER — Encounter: Payer: Self-pay | Admitting: Orthopedic Surgery

## 2017-07-09 DIAGNOSIS — Z9889 Other specified postprocedural states: Secondary | ICD-10-CM

## 2017-07-09 MED ORDER — TIZANIDINE HCL 4 MG PO TABS: 4.0000 mg | ORAL_TABLET | Freq: Four times a day (QID) | ORAL | 0 refills | Status: DC | PRN

## 2017-07-09 MED ORDER — GABAPENTIN 100 MG PO CAPS: 100.0000 mg | ORAL_CAPSULE | Freq: Three times a day (TID) | ORAL | 0 refills | Status: DC

## 2017-07-09 NOTE — Progress Notes (Signed)
POST OP VISIT   Patient ID: John Jones, male   DOB: 1971-02-16, 47 y.o.   MRN: [id number]  Chief Complaint  Patient presents with  . Routine Post Op    SHOULDER ARTHROSCOPY (Right Shoulder) ROTATOR CUFF REPAIR SHOULDER OPEN DOS 07/03/17     Encounter Diagnosis  Name Primary?  . S/P right rotator cuff repair 07/03/17    Patient is complained of severe pain since surgery.  His pain is in the periscapular region trapezius muscle as well as the right shoulder around the surgical site.  However, most of the pain is in the periscapular region he complains of not being able to sleep or lie down  He has no signs of infection he does have some radicular pain down his arm but no numbness or tingling he has a chronic loss of flexion of the DIP and PIP joint which is unchanged since surgery  His wife says she forgot to tell me about it and so did he before the surgery but it is nothing new  He has a nonabsorbable sutures which can be cut at the skin edge on next visit  I went over some things with him I increased his pain medicine refilled his gabapentin changed his muscle relaxer to Zanaflex  I advised some heat over the periscapular region and I told him I will call him next week when I am out of the office to see how things going  Encounter Diagnosis  Name Primary?  . S/P right rotator cuff repair 07/03/17     Meds ordered this encounter  Medications  . tiZANidine (ZANAFLEX) 4 MG tablet    Sig: Take 1 tablet (4 mg total) by mouth every 6 (six) hours as needed for muscle spasms.    Dispense:  30 tablet    Refill:  0  . gabapentin (NEURONTIN) 100 MG capsule    Sig: Take 1 capsule (100 mg total) by mouth 3 (three) times daily.    Dispense:  21 capsule    Refill:  0

## 2017-07-09 NOTE — Patient Instructions (Addendum)
Try to sleep upright x 1 week Heat 6 x a day apply to scapula Take new meds as prescribed  Steps to Quit Smoking Smoking tobacco can be bad for your health. It can also affect almost every organ in your body. Smoking puts you and people around you at risk for many serious John Jones-lasting (chronic) diseases. Quitting smoking is hard, but it is one of the best things that you can do for your health. It is never too late to quit. What are the benefits of quitting smoking? When you quit smoking, you lower your risk for getting serious diseases and conditions. They can include:  Lung cancer or lung disease.  Heart disease.  Stroke.  Heart attack.  Not being able to have children (infertility).  Weak bones (osteoporosis) and broken bones (fractures).  If you have coughing, wheezing, and shortness of breath, those symptoms may get better when you quit. You may also get sick less often. If you are pregnant, quitting smoking can help to lower your chances of having a baby of low birth weight. What can I do to help me quit smoking? Talk with your doctor about what can help you quit smoking. Some things you can do (strategies) include:  Quitting smoking totally, instead of slowly cutting back how much you smoke over a period of time.  Going to in-person counseling. You are more likely to quit if you go to many counseling sessions.  Using resources and support systems, such as: ? Database administrator with a Social worker. ? Phone quitlines. ? Careers information officer. ? Support groups or group counseling. ? Text messaging programs. ? Mobile phone apps or applications.  Taking medicines. Some of these medicines may have nicotine in them. If you are pregnant or breastfeeding, do not take any medicines to quit smoking unless your doctor says it is okay. Talk with your doctor about counseling or other things that can help you.  Talk with your doctor about using more than one strategy at the same time,  such as taking medicines while you are also going to in-person counseling. This can help make quitting easier. What things can I do to make it easier to quit? Quitting smoking might feel very hard at first, but there is a lot that you can do to make it easier. Take these steps:  Talk to your family and friends. Ask them to support and encourage you.  Call phone quitlines, reach out to support groups, or work with a Social worker.  Ask people who smoke to not smoke around you.  Avoid places that make you want (trigger) to smoke, such as: ? Bars. ? Parties. ? Smoke-break areas at work.  Spend time with people who do not smoke.  Lower the stress in your life. Stress can make you want to smoke. Try these things to help your stress: ? Getting regular exercise. ? Deep-breathing exercises. ? Yoga. ? Meditating. ? Doing a body scan. To do this, close your eyes, focus on one area of your body at a time from head to toe, and notice which parts of your body are tense. Try to relax the muscles in those areas.  Download or buy apps on your mobile phone or tablet that can help you stick to your quit plan. There are many free apps, such as QuitGuide from the [employer] Office manager for Disease Control and Prevention). You can find more support from smokefree.gov and other websites.  This information is not intended to replace advice given to you by  your health care provider. Make sure you discuss any questions you have with your health care provider. Document Released: 03/16/2009 Document Revised: 01/16/2016 Document Reviewed: 10/04/2014 Elsevier Interactive Patient Education  2018 [employer].

## 2017-07-11 ENCOUNTER — Ambulatory Visit: Payer: 59 | Admitting: Orthopedic Surgery

## 2017-07-16 ENCOUNTER — Other Ambulatory Visit: Payer: Self-pay | Admitting: Orthopedic Surgery

## 2017-07-16 MED ORDER — TIZANIDINE HCL 4 MG PO TABS: 4.0000 mg | ORAL_TABLET | Freq: Four times a day (QID) | ORAL | 0 refills | Status: DC | PRN

## 2017-07-16 MED ORDER — HYDROCODONE-ACETAMINOPHEN 10-325 MG PO TABS: 1.0000 | ORAL_TABLET | ORAL | 0 refills | Status: DC | PRN

## 2017-07-16 NOTE — Telephone Encounter (Signed)
Patient of Dr. Ruthe Mannan requests refill on Hydrocodone/Acetaminophen 10/325 mgs.   Qty  42       Sig: Take 1 tablet by mouth every 4 (four) hours as needed.     Patient states he uses [facility] on [address] In [place]

## 2017-07-16 NOTE — Telephone Encounter (Signed)
Patient of Dr. Ruthe Mannan requests refill on Zanaflex 4mg s.  Otho Darner  30    Sig: Take 1 tablet (4 mg total) by mouth every 6 (six) hours as needed for muscle spasms.  Patient states he uses [facility] in [place]  [place]

## 2017-07-16 NOTE — Telephone Encounter (Signed)
Surgery 07/03/17 RCR  Will you refill for Dr Aline Brochure?

## 2017-07-23 DIAGNOSIS — G4733 Obstructive sleep apnea (adult) (pediatric): Secondary | ICD-10-CM | POA: Diagnosis not present

## 2017-07-24 ENCOUNTER — Telehealth: Payer: Self-pay | Admitting: Orthopedic Surgery

## 2017-07-25 ENCOUNTER — Ambulatory Visit (INDEPENDENT_AMBULATORY_CARE_PROVIDER_SITE_OTHER): Payer: 59 | Admitting: Orthopedic Surgery

## 2017-07-25 ENCOUNTER — Encounter: Payer: Self-pay | Admitting: Orthopedic Surgery

## 2017-07-25 VITALS — BP 171/111 | HR 92 | Ht 65.0 in | Wt 170.0 lb

## 2017-07-25 DIAGNOSIS — R030 Elevated blood-pressure reading, without diagnosis of hypertension: Secondary | ICD-10-CM | POA: Diagnosis not present

## 2017-07-25 DIAGNOSIS — Z9889 Other specified postprocedural states: Secondary | ICD-10-CM

## 2017-07-25 DIAGNOSIS — Z299 Encounter for prophylactic measures, unspecified: Secondary | ICD-10-CM | POA: Diagnosis not present

## 2017-07-25 DIAGNOSIS — Z6831 Body mass index (BMI) 31.0-31.9, adult: Secondary | ICD-10-CM | POA: Diagnosis not present

## 2017-07-25 DIAGNOSIS — Z713 Dietary counseling and surveillance: Secondary | ICD-10-CM | POA: Diagnosis not present

## 2017-07-25 MED ORDER — HYDROCODONE-ACETAMINOPHEN 10-325 MG PO TABS: 1.0000 | ORAL_TABLET | ORAL | 0 refills | Status: DC | PRN

## 2017-07-25 MED ORDER — DIAZEPAM 10 MG PO TABS: 10.0000 mg | ORAL_TABLET | Freq: Four times a day (QID) | ORAL | 0 refills | Status: DC | PRN

## 2017-07-25 NOTE — Progress Notes (Signed)
POST OP VISIT #2  Patient ID: John Jones, male   DOB: 10-17-1970, 47 y.o.   MRN: [id number]  Chief Complaint  Patient presents with  . Post-op Follow-up    right shoulder 07/03/17    Encounter Diagnosis  Name Primary?  . S/P right rotator cuff repair 07/03/17 Yes   Patient still has continued significant pain though improved with increased hydrocodone and improved shoulder periscapular pain although he could not take Neurontin it caused foot and ankle edema  His pain is now primarily in his biceps tendon and some slightly around the shoulder joint but not much  He still cannot sleep well at night.  He does better with the arm straight versus in a sling and he does better standing versus sitting  His wound is clean dry and intact he denies any fever or chills  We recommended that he stay out of the sling more than in it and we will change him to Valium to try to get some muscle relaxation we will refill his pain medication and follow-up in 2 weeks  No therapy until we get pain under control  Meds ordered this encounter  Medications  . diazepam (VALIUM) 10 MG tablet    Sig: Take 1 tablet (10 mg total) by mouth every 6 (six) hours as needed for anxiety.    Dispense:  28 tablet    Refill:  0  . HYDROcodone-acetaminophen (NORCO) 10-325 MG tablet    Sig: Take 1 tablet by mouth every 4 (four) hours as needed.    Dispense:  42 tablet    Refill:  0

## 2017-07-28 DIAGNOSIS — R799 Abnormal finding of blood chemistry, unspecified: Secondary | ICD-10-CM | POA: Diagnosis not present

## 2017-07-28 DIAGNOSIS — E291 Testicular hypofunction: Secondary | ICD-10-CM | POA: Diagnosis not present

## 2017-07-28 DIAGNOSIS — N469 Male infertility, unspecified: Secondary | ICD-10-CM | POA: Diagnosis not present

## 2017-07-29 DIAGNOSIS — E291 Testicular hypofunction: Secondary | ICD-10-CM | POA: Diagnosis not present

## 2017-08-08 ENCOUNTER — Encounter: Payer: Self-pay | Admitting: Orthopedic Surgery

## 2017-08-08 ENCOUNTER — Ambulatory Visit (INDEPENDENT_AMBULATORY_CARE_PROVIDER_SITE_OTHER): Payer: 59 | Admitting: Orthopedic Surgery

## 2017-08-08 VITALS — BP 163/115 | HR 109 | Wt 173.0 lb

## 2017-08-08 DIAGNOSIS — Z9889 Other specified postprocedural states: Secondary | ICD-10-CM

## 2017-08-08 MED ORDER — IBUPROFEN 800 MG PO TABS: 800.0000 mg | ORAL_TABLET | Freq: Three times a day (TID) | ORAL | 1 refills | Status: DC | PRN

## 2017-08-08 MED ORDER — HYDROCODONE-ACETAMINOPHEN 10-325 MG PO TABS: 1.0000 | ORAL_TABLET | ORAL | 0 refills | Status: DC | PRN

## 2017-08-08 MED ORDER — DIAZEPAM 10 MG PO TABS: 10.0000 mg | ORAL_TABLET | Freq: Four times a day (QID) | ORAL | 0 refills | Status: DC | PRN

## 2017-08-08 NOTE — Patient Instructions (Addendum)

## 2017-08-08 NOTE — Progress Notes (Signed)
POST OP VISIT  Chief Complaint  Patient presents with  . Post-op Follow-up    right rotator cuff repair     Guarding to make some progress  He has decreased swelling decreased pain better pain control with ibuprofen hydrocodone and Valium  He was able to actively abduct his arm 70 degrees passive flexion was only 40 degrees  Recommend start pendulum exercises and cane assisted flexion follow-up in a week keep same medications  Patient ID: John Jones, male   DOB: [birth year], 47 y.o.   MRN: [id number]  Chief Complaint  Patient presents with  . Post-op Follow-up    right rotator cuff repair     Encounter Diagnosis  Name Primary?  . S/P right rotator cuff repair 07/03/17 Yes   Patient still has continued significant pain though improved with increased hydrocodone and improved shoulder periscapular pain although he could not take Neurontin it caused foot and ankle edema  His pain is now primarily in his biceps tendon and some slightly around the shoulder joint but not much  He still cannot sleep well at night.  He does better with the arm straight versus in a sling and he does better standing versus sitting  His wound is clean dry and intact he denies any fever or chills  We recommended that he stay out of the sling more than in it and we will change him to Valium to try to get some muscle relaxation we will refill his pain medication and follow-up in 2 weeks  No therapy until we get pain under control  No orders of the defined types were placed in this encounter.

## 2017-08-15 ENCOUNTER — Encounter: Payer: Self-pay | Admitting: Orthopedic Surgery

## 2017-08-15 ENCOUNTER — Ambulatory Visit (INDEPENDENT_AMBULATORY_CARE_PROVIDER_SITE_OTHER): Payer: 59 | Admitting: Orthopedic Surgery

## 2017-08-15 VITALS — BP 150/77 | HR 80 | Ht 65.0 in | Wt 175.0 lb

## 2017-08-15 DIAGNOSIS — Z9889 Other specified postprocedural states: Secondary | ICD-10-CM

## 2017-08-15 MED ORDER — HYDROCODONE-ACETAMINOPHEN 10-325 MG PO TABS: 1.0000 | ORAL_TABLET | ORAL | 0 refills | Status: DC | PRN

## 2017-08-15 NOTE — Progress Notes (Signed)
POST OP VISIT   Patient ID: John Jones, male   DOB: Mar 01, 1971, 47 y.o.   MRN: [id number]  Chief Complaint  Patient presents with  . Routine Post Op    07/03/17 right RCR     Encounter Diagnosis  Name Primary?  . S/P right rotator cuff repair 07/03/17 Yes    Current Outpatient Medications:  .  Aspirin-Salicylamide-Caffeine (BC HEADACHE POWDER PO), Take 1 packet by mouth daily as needed (for headaches.)., Disp: , Rfl:  .  diazepam (VALIUM) 10 MG tablet, Take 1 tablet (10 mg total) by mouth every 6 (six) hours as needed for anxiety., Disp: 28 tablet, Rfl: 0 .  HYDROcodone-acetaminophen (NORCO) 10-325 MG tablet, Take 1 tablet by mouth every 4 (four) hours as needed., Disp: 42 tablet, Rfl: 0 .  ibuprofen (ADVIL,MOTRIN) 800 MG tablet, Take 1 tablet (800 mg total) by mouth every 8 (eight) hours as needed., Disp: 90 tablet, Rfl: 1 .  Multiple Vitamins-Minerals (ADULT GUMMY PO), Take 2 tablets by mouth at bedtime. Multivitamin for Men, Disp: , Rfl:   He is doing well with hydrocodone Valium and ibuprofen  He can take his sling off  His passive flexion and passive pendulums are progressing well  Follow-up 2 weeks

## 2017-08-20 ENCOUNTER — Other Ambulatory Visit: Payer: Self-pay | Admitting: Orthopedic Surgery

## 2017-08-20 DIAGNOSIS — G4733 Obstructive sleep apnea (adult) (pediatric): Secondary | ICD-10-CM | POA: Diagnosis not present

## 2017-08-20 DIAGNOSIS — Z9889 Other specified postprocedural states: Secondary | ICD-10-CM

## 2017-08-20 MED ORDER — DIAZEPAM 10 MG PO TABS: 10.0000 mg | ORAL_TABLET | Freq: Four times a day (QID) | ORAL | 0 refills | Status: DC | PRN

## 2017-08-20 NOTE — Telephone Encounter (Signed)
Diazepam(VALIUM) 10 MG   Qty 28 Tablets  Take 1 tablet (10 mg total) by mouth every 6 (six) hours as needed for anxiety.  PATIENT USES [facility] ON [address].

## 2017-08-21 ENCOUNTER — Other Ambulatory Visit: Payer: Self-pay | Admitting: Orthopedic Surgery

## 2017-08-21 DIAGNOSIS — Z9889 Other specified postprocedural states: Secondary | ICD-10-CM

## 2017-08-21 MED ORDER — HYDROCODONE-ACETAMINOPHEN 10-325 MG PO TABS: 1.0000 | ORAL_TABLET | ORAL | 0 refills | Status: DC | PRN

## 2017-08-21 NOTE — Telephone Encounter (Signed)
Patient requests refill on Hydrocodone/Acetaminophen 10-325  Mgs.  Qty  42  Sig: Take 1 tablet by mouth every 4 (four) hours as needed.  Patient states he uses [facility] on [address] In [place]

## 2017-08-28 ENCOUNTER — Ambulatory Visit (INDEPENDENT_AMBULATORY_CARE_PROVIDER_SITE_OTHER): Payer: 59 | Admitting: Orthopedic Surgery

## 2017-08-28 ENCOUNTER — Encounter: Payer: Self-pay | Admitting: Orthopedic Surgery

## 2017-08-28 VITALS — BP 160/94 | HR 120 | Ht 65.0 in | Wt 172.0 lb

## 2017-08-28 DIAGNOSIS — Z9889 Other specified postprocedural states: Secondary | ICD-10-CM

## 2017-08-28 MED ORDER — HYDROCODONE-ACETAMINOPHEN 10-325 MG PO TABS: 1.0000 | ORAL_TABLET | ORAL | 0 refills | Status: DC | PRN

## 2017-08-28 MED ORDER — DIAZEPAM 10 MG PO TABS: 10.0000 mg | ORAL_TABLET | Freq: Four times a day (QID) | ORAL | 0 refills | Status: DC | PRN

## 2017-08-28 NOTE — Progress Notes (Signed)
POSTOP VISIT   POD # 56  BP (!) 160/94   Pulse (!) 120   Ht 5\' 5"  (1.651 m)   Wt 172 lb (78 kg)   BMI 28.62 kg/m   Encounter Diagnosis  Name Primary?  . S/P right rotator cuff repair 07/03/17 Yes    The surgical site incision clean dry and intact with no drainage  The neurovascular examination of the extremity is normal in terms of sensation pulse and perfusion  Postoperative plan  Start therapy April 8  Patient says he can finally sleep as long is in the recliner  Passive motion with cane assisted flexion 0-90 degrees  Meds ordered this encounter  Medications  . HYDROcodone-acetaminophen (NORCO) 10-325 MG tablet    Sig: Take 1 tablet by mouth every 4 (four) hours as needed.    Dispense:  42 tablet    Refill:  0  . diazepam (VALIUM) 10 MG tablet    Sig: Take 1 tablet (10 mg total) by mouth every 6 (six) hours as needed for anxiety.    Dispense:  28 tablet    Refill:  0   Pain is now well controlled can start to taper  Return 2 weeks

## 2017-08-29 ENCOUNTER — Ambulatory Visit: Payer: 59 | Admitting: Orthopedic Surgery

## 2017-09-04 ENCOUNTER — Other Ambulatory Visit: Payer: Self-pay | Admitting: Orthopedic Surgery

## 2017-09-04 DIAGNOSIS — Z9889 Other specified postprocedural states: Secondary | ICD-10-CM

## 2017-09-04 MED ORDER — HYDROCODONE-ACETAMINOPHEN 10-325 MG PO TABS: 1.0000 | ORAL_TABLET | ORAL | 0 refills | Status: DC | PRN

## 2017-09-04 NOTE — Telephone Encounter (Signed)
Patient requests refill:  HYDROcodone-acetaminophen (NORCO) 10-325 MG tablet 1 tablet  [facility], [address] [place]

## 2017-09-10 ENCOUNTER — Encounter (HOSPITAL_COMMUNITY): Payer: Self-pay | Admitting: Specialist

## 2017-09-10 ENCOUNTER — Other Ambulatory Visit: Payer: Self-pay

## 2017-09-10 ENCOUNTER — Other Ambulatory Visit: Payer: Self-pay | Admitting: Orthopedic Surgery

## 2017-09-10 ENCOUNTER — Telehealth: Payer: Self-pay | Admitting: Orthopedic Surgery

## 2017-09-10 ENCOUNTER — Ambulatory Visit (HOSPITAL_COMMUNITY): Payer: 59 | Attending: Orthopedic Surgery | Admitting: Specialist

## 2017-09-10 DIAGNOSIS — R29898 Other symptoms and signs involving the musculoskeletal system: Secondary | ICD-10-CM | POA: Diagnosis present

## 2017-09-10 DIAGNOSIS — Z9889 Other specified postprocedural states: Secondary | ICD-10-CM

## 2017-09-10 DIAGNOSIS — M25511 Pain in right shoulder: Secondary | ICD-10-CM | POA: Insufficient documentation

## 2017-09-10 DIAGNOSIS — M25611 Stiffness of right shoulder, not elsewhere classified: Secondary | ICD-10-CM | POA: Insufficient documentation

## 2017-09-10 MED ORDER — HYDROCODONE-ACETAMINOPHEN 7.5-325 MG PO TABS: 1.0000 | ORAL_TABLET | ORAL | 0 refills | Status: DC | PRN

## 2017-09-10 NOTE — Telephone Encounter (Signed)
Hydrocodone-Acetaminophen  10/325mg  Qty 42 Tablets ° °Take 1 tablet by mouth every 4 (four) hours as needed. °

## 2017-09-10 NOTE — Telephone Encounter (Signed)
Patient called back to request refill on his other medication:  diazepam (VALIUM) 10 MG tablet 28 tablet    - [facility], [address] [place]

## 2017-09-10 NOTE — Therapy (Signed)
[place], [place], [address] Phone: [phone]   Fax:  [phone]  Occupational Therapy Evaluation  Patient Details  Name: John Jones MRN: [id number] Date of Birth: 1971-01-13 Referring Provider: Dr. Arther Abbott   Encounter Date: 09/10/2017  OT End of Session - 09/10/17 1442    Visit Number  1    Number of Visits  16    Date for OT Re-Evaluation  11/09/17 mini reassess on 10/10/17    Authorization Type  [employer] - 15 visit limit    Authorization - Visit Number  1    Authorization - Number of Visits  60    OT Start Time  7353    OT Stop Time  1120    OT Time Calculation (min)  40 min    Activity Tolerance  Patient tolerated treatment well    Behavior During Therapy  [facility] for tasks assessed/performed       Past Medical History:  Diagnosis Date  . History of kidney stones    multiple episodes  . Migraine   . Right ureteral stone   . Sleep apnea    Uses CPAP    Past Surgical History:  Procedure Laterality Date  . CARPAL TUNNEL RELEASE Right 12/08/2014   Procedure: RIGHT CARPAL TUNNEL RELEASE;  Surgeon: Iran Planas, MD;  Location: [facility];  Service: Orthopedics;  Laterality: Right;  . CYSTOSCOPY Right 12/2015   stent placed and removed   . CYSTOSCOPY W/ URETERAL STENT PLACEMENT Right 07/25/2016   Procedure: CYSTOSCOPY/RETROGRADE/URETEROSCOPY/STENT PLACEMENT;  Surgeon: Alexis Frock, MD;  Location: [facility];  Service: Urology;  Laterality: Right;  . INCISION AND DRAINAGE Right 01/19/2016   Procedure: INCISION AND DRAINAGE RIGHT LONG FINGER;  Surgeon: Carole Civil, MD;  Location: [facility];  Service: Orthopedics;  Laterality: Right;  RIGHT LONG FINGER - PT COMING AT 9:00 FOR LABWORK  . SHOULDER ARTHROSCOPY WITH OPEN ROTATOR CUFF REPAIR Right 07/03/2017   Procedure: SHOULDER ARTHROSCOPY;  Surgeon: Carole Civil, MD;  Location: [facility];  Service: Orthopedics;  Laterality: Right;   . SHOULDER OPEN ROTATOR CUFF REPAIR Right 07/03/2017   Procedure: ROTATOR CUFF REPAIR SHOULDER OPEN;  Surgeon: Carole Civil, MD;  Location: [facility];  Service: Orthopedics;  Laterality: Right;  . WRIST ARTHROSCOPY Right 12/08/2014   Procedure: RIGHT WRIST ARTHROSCOPY WITH DEBRIDEMENT/OR REPAIR;  Surgeon: Iran Planas, MD;  Location: [facility];  Service: Orthopedics;  Laterality: Right;    There were no vitals filed for this visit.  Subjective Assessment - 09/10/17 1437    Subjective   S:  I hope to get back to work, Im not sure.     Pertinent History  Mr. Monforte reports progressive worsening of pain and decreased mobility beginning in 2016.  In December 2018 pain became to be so intense that he consulted with Dr. Aline Brochure.   A MRI was performed and a tear in the supraspinatus was detected.  Mr. Sweetman underwent arthroscopic rotator cuff repair and bursectomy on 07/03/17.  He has been referred to occupational therapy for evaluation and treatment.     Special Tests  FOTO:  48.82    Patient Stated Goals  I just want to get better.     Currently in Pain?  Yes    Pain Score  8     Pain Location  Shoulder    Pain Orientation  Right    Pain Descriptors / Indicators  Hervey Ard  Pain Type  Acute pain    Pain Radiating Towards  upper arm    Pain Onset  More than a month ago    Pain Frequency  Constant    Aggravating Factors   lying down at night, movement of arm    Pain Relieving Factors  heat and medication    Effect of Pain on Daily Activities  max        [facility] OT Assessment - 09/10/17 0001      Assessment   Medical Diagnosis  S/P Right Arthroscopic RCR and bursectomy    Referring Provider  Dr. Arther Abbott    Onset Date/Surgical Date  07/03/17    Hand Dominance  Right    Prior Therapy  n/a      Precautions   Precautions  Shoulder    Precaution Comments  Dr. Aline Brochure protocol:  weeks 1-5 of therapy (through 5/15) P/ROM only with flexion limited to 100 and external rotation  limited to 30.  5/16 and beyond, begin AA/ROM, progress to A/ROM and strengthening as tolerated        Restrictions   Weight Bearing Restrictions  Yes    RUE Weight Bearing  Non weight bearing      Balance Screen   Has the patient fallen in the past 6 months  No    Has the patient had a decrease in activity level because of a fear of falling?   No    Is the patient reluctant to leave their home because of a fear of falling?   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    Leisure  skateboarding       ADL   ADL comments  unable to don and doff clothing without assistance, unable to fasten buttons, unable to use right arm as dominant with any activity above waist height, unable to assist with cleaning and cooking tasks, unable to work      Written Expression   Dominant Hand  Right      Vision - History   Baseline Vision  No visual deficits      Cognition   Overall Cognitive Status  Within Functional Limits for tasks assessed      Observation/Other Assessments   Focus on Therapeutic Outcomes (FOTO)   48.82      Sensation   Light Touch  Appears Intact      Coordination   Gross Motor Movements are Fluid and Coordinated  Yes    Fine Motor Movements are Fluid and Coordinated  Yes      ROM / Strength   AROM / PROM / Strength  AROM;PROM;Strength      Palpation   Palpation comment  max fascial restrictions and soft tissue restrictions in right shoulder, upper arm region       AROM   Overall AROM Comments  assessed in seated, external and internal rotation with shoulder adducted    AROM Assessment Site  Shoulder    Right/Left Shoulder  Right    Right Shoulder Flexion  60 Degrees    Right Shoulder ABduction  47 Degrees     Right Shoulder Internal Rotation  80 Degrees  Right Shoulder External Rotation  18 Degrees      PROM   Overall PROM Comments  assessed in supine, external and internal rotation with shoulder adducted    PROM Assessment Site  Shoulder    Right/Left Shoulder  Right    Right Shoulder Flexion  70 Degrees    Right Shoulder ABduction  75 Degrees    Right Shoulder Internal Rotation  54 Degrees    Right Shoulder External Rotation  45 Degrees      Strength   Overall Strength Comments  not assessed due to protocol and increased pain    Strength Assessment Site  Shoulder    Right/Left Shoulder  Right               OT Treatments/Exercises (OP) - 09/10/17 0001      Exercises   Exercises  Shoulder      Shoulder Exercises: Supine   External Rotation  PROM;5 reps    Internal Rotation  PROM;5 reps    Flexion  PROM;5 reps    ABduction  PROM;5 reps      Shoulder Exercises: ROM/Strengthening   Other ROM/Strengthening Exercises  towel slides flexion, abduction, external rotation 5 times       Manual Therapy   Manual Therapy  Soft tissue mobilization    Manual therapy comments  manual therapy interventions completed seperately from all other interventions this date of service    Soft tissue mobilization  manual soft tissue mobilization to right upper arm, scapular, and shoulder region to decrease pain and restrictions and improve pain free mobility in order to return to use of RUE as dominant with daily tasks.             OT Education - 09/10/17 1441    Education provided  Yes    Education Details  edcuated patient on hep for towel slides, also educated on proper technique for pendulum exercises that were given to him by MD.    Terence Lux) Educated  Patient    Methods  Explanation;Demonstration;Handout    Comprehension  Verbalized understanding;Returned demonstration       OT Short Term Goals - 09/10/17 1519      OT SHORT TERM GOAL #1   Title  Patient will be educated and  independent with HEP for improved right shoulder mobility.     Time  4    Period  Weeks    Status  New    Target Date  10/10/17      OT SHORT TERM GOAL #2   Title  Paitent will improve right shoulder P/ROM to [facility] in right shoulder for improved ability to don and doff clothing independently.     Time  4    Period  Weeks    Status  New      OT SHORT TERM GOAL #3   Title  Patient will improve right shoulder strength to 3+/5 for improved ability to reach to shoulder height during adl completion independently.     Time  4    Period  Weeks    Status  New      OT SHORT TERM GOAL #4   Title  Patient will decrease pain in his right shoulder to 5/10 or better during adl completion.     Time  4    Period  Weeks    Status  New      OT SHORT TERM GOAL #5   Title  Patient will decrease fascial restrictions from max  to mod-max for greater mobility needed for adl completion.     Time  4    Period  Weeks    Status  New        OT Long Term Goals - 09/10/17 1521      OT LONG TERM GOAL #1   Title  Patient will use his right arm as dominant with all functional B/IADLS, work, and leisure activities.     Time  8    Period  Weeks    Status  New    Target Date  11/09/17      OT LONG TERM GOAL #2   Title  Paitent will improve right shoulder A/ROM to [facility] in right shoulder for improved ability to reach overhead at work and behind his head and back.     Time  8    Period  Weeks    Status  New      OT LONG TERM GOAL #3   Title  Patient will improve right shoulder strength to 5/5 for improved ability to lift items overhead at work.     Time  8    Period  Weeks    Status  New      OT LONG TERM GOAL #4   Title  Patient will decrease pain in his right shoulder to 3/10 or better during adl completion.    Time  8    Period  Weeks    Status  New      OT LONG TERM GOAL #5   Title  Patient will decrease fascial restrictions from mod-max to min for greater mobility needed for adl completion.      Time  8    Period  Weeks    Status  New            Plan - 09/10/17 1500    Clinical Impression Statement  A:  Patient is a 47 year old male s/p right arthroscopic rotator cuff repair on 07/03/17.  Patient has been referred to occupational therapy for evaluation and treatment of his RUE.  Patient is unable to use his dominant right arm for any activities above waist height, per protocol, which is greatly affecting his independence level with all B/IADLS, work, and leisure activities.      Occupational Profile and client history currently impacting functional performance  age, medical history    Occupational performance deficits (Please refer to evaluation for details):  ADL's;IADL's;Rest and Sleep;Work;Leisure;Social Participation    Rehab Potential  Good    Current Impairments/barriers affecting progress:  pain tolerance    OT Frequency  2x / week    OT Duration  8 weeks    OT Treatment/Interventions  Self-care/ADL training;Therapeutic exercise;Ultrasound;Manual Therapy;Therapeutic activities;Moist Heat;Passive range of motion;Patient/family education    Plan  P: Skilled OT intervention 2 times per week for 8 weeks to decrease pain and restrictions to movement and improve pain free mobility and strength in right arm in order to  return to use of right arm as dominant with all b/iadls, work, and leisure activiites.  Next session:  follow up on HEP.  Begin P/ROM and isometric strengthening and follow protocol.        Patient will benefit from skilled therapeutic intervention in order to improve the following deficits and impairments:  Increased muscle spasms, Decreased coping skills, Decreased strength, Decreased range of motion, Pain, Impaired UE functional use  Visit Diagnosis: Acute pain of right shoulder  Stiffness of right shoulder, not elsewhere classified  Other symptoms and signs involving the musculoskeletal system    Problem List Patient Active Problem List   Diagnosis  Date Noted  . S/P right rotator cuff repair 07/03/17 07/08/2017  . Bursitis of right shoulder   . Ureteral stone with hydronephrosis 07/25/2016  . Abscess of finger of right hand     Lynelle Doctor, OTR/L [phone]  09/10/2017, 3:25 PM  [place], [place], [address] Phone: [phone]   Fax:  [phone]  Name: DAYYAN KRIST MRN: [id number] Date of Birth: 08-Oct-1970

## 2017-09-10 NOTE — Patient Instructions (Signed)
SHOULDER: Flexion On Table   Place hands on table, elbows straight.Slide arms across table, leaning forward as able.  . Repeat 25 times, 3 times per day. Abduction (Passive)   With arm out to side, resting on table, palm down, slide arm across table, repeat 25 times 3 times per day. Copyright  VHI. All rights reserved.     Internal Rotation (Assistive)   Seated with elbow bent at right angle and held against side, slide arm on table surface in an inward arc. Repeat ____ times. Do ____ sessions per day. Activity: Use this motion to brush crumbs off the table.  Copyright  VHI. All rights reserved.

## 2017-09-11 ENCOUNTER — Telehealth (HOSPITAL_COMMUNITY): Payer: Self-pay | Admitting: Specialist

## 2017-09-11 ENCOUNTER — Other Ambulatory Visit: Payer: Self-pay | Admitting: Orthopedic Surgery

## 2017-09-11 ENCOUNTER — Ambulatory Visit (HOSPITAL_COMMUNITY): Payer: 59 | Admitting: Occupational Therapy

## 2017-09-11 ENCOUNTER — Encounter (HOSPITAL_COMMUNITY): Payer: Self-pay | Admitting: Occupational Therapy

## 2017-09-11 DIAGNOSIS — M25611 Stiffness of right shoulder, not elsewhere classified: Secondary | ICD-10-CM

## 2017-09-11 DIAGNOSIS — M25511 Pain in right shoulder: Secondary | ICD-10-CM | POA: Diagnosis not present

## 2017-09-11 DIAGNOSIS — R29898 Other symptoms and signs involving the musculoskeletal system: Secondary | ICD-10-CM

## 2017-09-11 MED ORDER — DIAZEPAM 5 MG PO TABS: 5.0000 mg | ORAL_TABLET | Freq: Four times a day (QID) | ORAL | 0 refills | Status: DC | PRN

## 2017-09-11 NOTE — Therapy (Signed)
[place], [place], [address] Phone: [phone]   Fax:  [phone]  Occupational Therapy Treatment  Patient Details  Name: John Jones MRN: [id number] Date of Birth: July 01, 1970 Referring Provider: Dr. Arther Abbott   Encounter Date: 09/11/2017  OT End of Session - 09/11/17 1154    Visit Number  2    Number of Visits  16    Date for OT Re-Evaluation  11/09/17 mini reassess on 10/10/17    Authorization Type  U[employer] - 60 visit limit    Authorization - Visit Number  2    Authorization - Number of Visits  60    OT Start Time  7209    OT Stop Time  1113    OT Time Calculation (min)  41 min    Activity Tolerance  Patient tolerated treatment well    Behavior During Therapy  [facility] for tasks assessed/performed       Past Medical History:  Diagnosis Date  . History of kidney stones    multiple episodes  . Migraine   . Right ureteral stone   . Sleep apnea    Uses CPAP    Past Surgical History:  Procedure Laterality Date  . CARPAL TUNNEL RELEASE Right 12/08/2014   Procedure: RIGHT CARPAL TUNNEL RELEASE;  Surgeon: Iran Planas, MD;  Location: [facility];  Service: Orthopedics;  Laterality: Right;  . CYSTOSCOPY Right 12/2015   stent placed and removed   . CYSTOSCOPY W/ URETERAL STENT PLACEMENT Right 07/25/2016   Procedure: CYSTOSCOPY/RETROGRADE/URETEROSCOPY/STENT PLACEMENT;  Surgeon: Alexis Frock, MD;  Location: [facility];  Service: Urology;  Laterality: Right;  . INCISION AND DRAINAGE Right 01/19/2016   Procedure: INCISION AND DRAINAGE RIGHT LONG FINGER;  Surgeon: Carole Civil, MD;  Location: [facility];  Service: Orthopedics;  Laterality: Right;  RIGHT LONG FINGER - PT COMING AT 9:00 FOR LABWORK  . SHOULDER ARTHROSCOPY WITH OPEN ROTATOR CUFF REPAIR Right 07/03/2017   Procedure: SHOULDER ARTHROSCOPY;  Surgeon: Carole Civil, MD;  Location: [facility];  Service: Orthopedics;  Laterality: Right;   . SHOULDER OPEN ROTATOR CUFF REPAIR Right 07/03/2017   Procedure: ROTATOR CUFF REPAIR SHOULDER OPEN;  Surgeon: Carole Civil, MD;  Location: [facility];  Service: Orthopedics;  Laterality: Right;  . WRIST ARTHROSCOPY Right 12/08/2014   Procedure: RIGHT WRIST ARTHROSCOPY WITH DEBRIDEMENT/OR REPAIR;  Surgeon: Iran Planas, MD;  Location: [facility];  Service: Orthopedics;  Laterality: Right;    There were no vitals filed for this visit.  Subjective Assessment - 09/11/17 1032    Subjective   S: The pain eases some after I get up.     Currently in Pain?  Yes    Pain Score  6     Pain Location  Shoulder    Pain Orientation  Right    Pain Descriptors / Indicators  Aching;Sore    Pain Radiating Towards  upper arm, trapezius     Pain Onset  More than a month ago    Pain Frequency  Constant    Aggravating Factors   lying down at night, movement of arm    Pain Relieving Factors  heat and medication    Effect of Pain on Daily Activities  max effect on ADLs          [facility] OT Assessment - 09/11/17 1031      Assessment   Medical Diagnosis  S/P Right Arthroscopic RCR and bursectomy  Precautions   Precautions  Shoulder    Precaution Comments  Dr. Aline Brochure protocol:  weeks 1-5 of therapy (through 5/15) P/ROM only with flexion limited to 100 and external rotation limited to 30.  5/16 and beyond, begin AA/ROM, progress to A/ROM and strengthening as tolerated        Restrictions   Weight Bearing Restrictions  Yes    RUE Weight Bearing  Non weight bearing               OT Treatments/Exercises (OP) - 09/11/17 1035      Exercises   Exercises  Shoulder      Shoulder Exercises: Supine   Protraction  PROM;10 reps    Horizontal ABduction  PROM;10 reps    External Rotation  PROM;10 reps    External Rotation Limitations  to 30 per protocol    Internal Rotation  PROM;10 reps    Flexion  PROM;10 reps    Flexion Limitations  to 100 per protocol    ABduction  PROM;10 reps      Shoulder  Exercises: Seated   Elevation  AROM;10 reps    Extension  AROM;10 reps    Row  AROM;10 reps    Other Seated Exercises  shoulder depression, A/ROM, 10X      Shoulder Exercises: Therapy Ball   Flexion  10 reps    ABduction  10 reps      Shoulder Exercises: Isometric Strengthening   Flexion  Supine;3X3"    Extension  Supine;3X3"    External Rotation  Supine;3X3"    Internal Rotation  Supine;3X3"    ABduction  Supine;3X3"    ADduction  Supine;3X3"      Manual Therapy   Manual Therapy  Soft tissue mobilization    Manual therapy comments  manual therapy interventions completed seperately from all other interventions this date of service    Soft tissue mobilization  manual soft tissue mobilization to right upper arm, scapular, and shoulder region to decrease pain and restrictions and improve pain free mobility in order to return to use of RUE as dominant with daily tasks.              OT Education - 09/10/17 1441    Education provided  Yes    Education Details  edcuated patient on hep for towel slides, also educated on proper technique for pendulum exercises that were given to him by MD.    Terence Lux) Educated  Patient    Methods  Explanation;Demonstration;Handout    Comprehension  Verbalized understanding;Returned demonstration       OT Short Term Goals - 09/11/17 1108      OT SHORT TERM GOAL #1   Title  Patient will be educated and independent with HEP for improved right shoulder mobility.     Time  4    Period  Weeks    Status  On-going      OT SHORT TERM GOAL #2   Title  Paitent will improve right shoulder P/ROM to [facility] in right shoulder for improved ability to don and doff clothing independently.     Time  4    Period  Weeks    Status  On-going      OT SHORT TERM GOAL #3   Title  Patient will improve right shoulder strength to 3+/5 for improved ability to reach to shoulder height during adl completion independently.     Time  4    Period  Weeks    Status  On-going      OT SHORT TERM GOAL #4   Title  Patient will decrease pain in his right shoulder to 5/10 or better during adl completion.     Time  4    Period  Weeks    Status  On-going      OT SHORT TERM GOAL #5   Title  Patient will decrease fascial restrictions from max to mod-max for greater mobility needed for adl completion.     Time  4    Period  Weeks    Status  On-going        OT Long Term Goals - 09/11/17 1108      OT LONG TERM GOAL #1   Title  Patient will use his right arm as dominant with all functional B/IADLS, work, and leisure activities.     Time  8    Period  Weeks    Status  On-going      OT LONG TERM GOAL #2   Title  Paitent will improve right shoulder A/ROM to [facility] in right shoulder for improved ability to reach overhead at work and behind his head and back.     Time  8    Period  Weeks    Status  On-going      OT LONG TERM GOAL #3   Title  Patient will improve right shoulder strength to 5/5 for improved ability to lift items overhead at work.     Time  8    Period  Weeks    Status  On-going      OT LONG TERM GOAL #4   Title  Patient will decrease pain in his right shoulder to 3/10 or better during adl completion.    Time  8    Period  Weeks    Status  On-going      OT LONG TERM GOAL #5   Title  Patient will decrease fascial restrictions from mod-max to min for greater mobility needed for adl completion.     Time  8    Period  Weeks    Status  On-going            Plan - 09/11/17 1108    Clinical Impression Statement  A: Initiated soft issue mobilization to decrease pain and restrictions allowing for improved ROM. Pt able to tolerate P/ROM to 90 degrees with flexion, 30 degrees er. Initiated isometrics, scapular A/ROM, and therapy ball exercises. Pt requiring increased time for completion of exercises, verbal cuing for form and technique.      Plan  P: continue working to improve ROM within protocol limits while decreasing pain        Patient will benefit from skilled therapeutic intervention in order to improve the following deficits and impairments:  Increased muscle spasms, Decreased coping skills, Decreased strength, Decreased range of motion, Pain, Impaired UE functional use  Visit Diagnosis: Acute pain of right shoulder  Stiffness of right shoulder, not elsewhere classified  Other symptoms and signs involving the musculoskeletal system    Problem List Patient Active Problem List   Diagnosis Date Noted  . S/P right rotator cuff repair 07/03/17 07/08/2017  . Bursitis of right shoulder   . Ureteral stone with hydronephrosis 07/25/2016  . Abscess of finger of right hand     Guadelupe Sabin, OTR/L  [phone] 09/11/2017, 11:54 AM  [place], [place], [address] Phone: [phone]   Fax:  [phone]  Name: FAROOQ PETROVICH MRN: [id number] Date of Birth: 1971/05/31

## 2017-09-11 NOTE — Telephone Encounter (Signed)
Called to schedule 2x/wk 8wks per BM/WT. NF 09/10/17

## 2017-09-16 ENCOUNTER — Other Ambulatory Visit: Payer: Self-pay | Admitting: Orthopedic Surgery

## 2017-09-16 DIAGNOSIS — Z9889 Other specified postprocedural states: Secondary | ICD-10-CM

## 2017-09-16 DIAGNOSIS — E291 Testicular hypofunction: Secondary | ICD-10-CM | POA: Diagnosis not present

## 2017-09-16 MED ORDER — HYDROCODONE-ACETAMINOPHEN 7.5-325 MG PO TABS: 1.0000 | ORAL_TABLET | ORAL | 0 refills | Status: DC | PRN

## 2017-09-16 NOTE — Telephone Encounter (Signed)
Patient called for refill:  HYDROcodone-acetaminophen (NORCO) 7.5-325 MG tablet 42 tablet 0 09/10/2017  -[facility], [address], [place]

## 2017-09-20 DIAGNOSIS — G4733 Obstructive sleep apnea (adult) (pediatric): Secondary | ICD-10-CM | POA: Diagnosis not present

## 2017-09-23 ENCOUNTER — Other Ambulatory Visit: Payer: Self-pay | Admitting: Orthopedic Surgery

## 2017-09-23 DIAGNOSIS — Z9889 Other specified postprocedural states: Secondary | ICD-10-CM

## 2017-09-23 NOTE — Telephone Encounter (Signed)
Hydrocodone-Acetaminophen  7.5/325 mg  Qty  42 Tablets  Take 1 tablet by mouth every 4 (four) hours as needed for up to 7 days for moderate pain  PATIENT John Jones

## 2017-09-24 ENCOUNTER — Telehealth (HOSPITAL_COMMUNITY): Payer: Self-pay | Admitting: Internal Medicine

## 2017-09-24 ENCOUNTER — Ambulatory Visit (HOSPITAL_COMMUNITY): Payer: 59

## 2017-09-24 MED ORDER — HYDROCODONE-ACETAMINOPHEN 7.5-325 MG PO TABS: 1.0000 | ORAL_TABLET | ORAL | 0 refills | Status: DC | PRN

## 2017-09-24 NOTE — Telephone Encounter (Signed)
09/24/17  pt cx said he wouldn't be able to make it this morning

## 2017-09-29 ENCOUNTER — Encounter (HOSPITAL_COMMUNITY): Payer: 59 | Admitting: Specialist

## 2017-09-29 ENCOUNTER — Ambulatory Visit (HOSPITAL_COMMUNITY): Payer: 59 | Admitting: Occupational Therapy

## 2017-09-29 ENCOUNTER — Telehealth (HOSPITAL_COMMUNITY): Payer: Self-pay | Admitting: Occupational Therapy

## 2017-09-29 NOTE — Telephone Encounter (Signed)
Left message for pt regarding no-show for 4/29 appt. Reminded of next appt.   Guadelupe Sabin, OTR/L  [phone] 09/29/2017

## 2017-10-01 ENCOUNTER — Ambulatory Visit (HOSPITAL_COMMUNITY): Payer: 59 | Attending: Orthopedic Surgery

## 2017-10-01 ENCOUNTER — Ambulatory Visit (INDEPENDENT_AMBULATORY_CARE_PROVIDER_SITE_OTHER): Payer: Self-pay | Admitting: Orthopedic Surgery

## 2017-10-01 ENCOUNTER — Other Ambulatory Visit: Payer: Self-pay

## 2017-10-01 ENCOUNTER — Encounter: Payer: Self-pay | Admitting: Orthopedic Surgery

## 2017-10-01 ENCOUNTER — Encounter (HOSPITAL_COMMUNITY): Payer: Self-pay

## 2017-10-01 VITALS — BP 142/101 | HR 105 | Ht 65.0 in | Wt 171.0 lb

## 2017-10-01 DIAGNOSIS — R29898 Other symptoms and signs involving the musculoskeletal system: Secondary | ICD-10-CM | POA: Insufficient documentation

## 2017-10-01 DIAGNOSIS — M25511 Pain in right shoulder: Secondary | ICD-10-CM | POA: Insufficient documentation

## 2017-10-01 DIAGNOSIS — M25611 Stiffness of right shoulder, not elsewhere classified: Secondary | ICD-10-CM | POA: Insufficient documentation

## 2017-10-01 DIAGNOSIS — Z9889 Other specified postprocedural states: Secondary | ICD-10-CM

## 2017-10-01 MED ORDER — HYDROCODONE-ACETAMINOPHEN 7.5-325 MG PO TABS: 1.0000 | ORAL_TABLET | ORAL | 0 refills | Status: DC | PRN

## 2017-10-01 NOTE — Progress Notes (Signed)
Progress Note   Patient ID: John Jones, male   DOB: 07-02-1970, 47 y.o.   MRN: [id number]  Chief Complaint  Patient presents with  . Post-op Follow-up    07/03/17 rotator cuff repair right      Medical decision-making Encounter Diagnosis  Name Primary?  . S/P right rotator cuff repair 07/03/17 Yes      No orders of the defined types were placed in this encounter.    PLAN: Follow-up in 6 weeks continue hydrocodone for pain continue going to therapy.  I advised him not to do any strenuous activities at home    Chief Complaint  Patient presents with  . Post-op Follow-up    07/03/17 rotator cuff repair right     POD 90  Open rotator cuff repair marked by unexplained excessive pain in the early postop.  Apparently was related to some type of neuropathy in the right upper extremity which is improved patient can now sleep in his bed at night just not on his right side he is going to therapy has improved his active motion to 90 degrees of abduction    ROS Current Meds  Medication Sig  . Aspirin-Salicylamide-Caffeine (BC HEADACHE POWDER PO) Take 1 packet by mouth daily as needed (for headaches.).  Marland Kitchen diazepam (VALIUM) 5 MG tablet Take 1 tablet (5 mg total) by mouth every 6 (six) hours as needed for anxiety.  Marland Kitchen HYDROcodone-acetaminophen (NORCO) 7.5-325 MG tablet Take 1 tablet by mouth every 4 (four) hours as needed for up to 7 days for moderate pain.  Marland Kitchen ibuprofen (ADVIL,MOTRIN) 800 MG tablet Take 1 tablet (800 mg total) by mouth every 8 (eight) hours as needed.  . Multiple Vitamins-Minerals (ADULT GUMMY PO) Take 2 tablets by mouth at bedtime. Multivitamin for Men    Allergies  Allergen Reactions  . Penicillins Other (See Comments)    Unknown-childhood Has patient had a PCN reaction causing immediate rash, facial/tongue/throat swelling, SOB or lightheadedness with hypotension: Unknown Has patient had a PCN reaction causing severe rash involving mucus membranes or skin  necrosis: Unknown Has patient had a PCN reaction that required hospitalization: Unknown Has patient had a PCN reaction occurring within the last 10 years: No If all of the above answers are "NO", then may proceed with Cephalosporin use.      BP (!) 142/101   Pulse (!) 105   Ht 5\' 5"  (1.651 m)   Wt 171 lb (77.6 kg)   BMI 28.46 kg/m   Physical Exam  90 degrees abduction and flexion actively external rotation is 45 degrees   Arther Abbott, MD  10/01/2017 11:14 AM

## 2017-10-01 NOTE — Therapy (Signed)
[place], [place], [address] Phone: [phone]   Fax:  [phone]  Occupational Therapy Treatment  Patient Details  Name: John Jones MRN: [id number] Date of Birth: 11-24-1970 Referring Provider: Dr. Arther Abbott   Encounter Date: 10/01/2017  OT End of Session - 10/01/17 0854    Visit Number  3    Number of Visits  16    Date for OT Re-Evaluation  11/09/17 mini reassess on 10/10/17    Authorization Type  [employer] - 4 visit limit    Authorization - Visit Number  3    Authorization - Number of Visits  91    OT Start Time  0820    OT Stop Time  0900    OT Time Calculation (min)  40 min    Activity Tolerance  Patient tolerated treatment well    Behavior During Therapy  [facility] for tasks assessed/performed       Past Medical History:  Diagnosis Date  . History of kidney stones    multiple episodes  . Migraine   . Right ureteral stone   . Sleep apnea    Uses CPAP    Past Surgical History:  Procedure Laterality Date  . CARPAL TUNNEL RELEASE Right 12/08/2014   Procedure: RIGHT CARPAL TUNNEL RELEASE;  Surgeon: Iran Planas, MD;  Location: [facility];  Service: Orthopedics;  Laterality: Right;  . CYSTOSCOPY Right 12/2015   stent placed and removed   . CYSTOSCOPY W/ URETERAL STENT PLACEMENT Right 07/25/2016   Procedure: CYSTOSCOPY/RETROGRADE/URETEROSCOPY/STENT PLACEMENT;  Surgeon: Alexis Frock, MD;  Location: [facility];  Service: Urology;  Laterality: Right;  . INCISION AND DRAINAGE Right 01/19/2016   Procedure: INCISION AND DRAINAGE RIGHT LONG FINGER;  Surgeon: Carole Civil, MD;  Location: [facility];  Service: Orthopedics;  Laterality: Right;  RIGHT LONG FINGER - PT COMING AT 9:00 FOR LABWORK  . SHOULDER ARTHROSCOPY WITH OPEN ROTATOR CUFF REPAIR Right 07/03/2017   Procedure: SHOULDER ARTHROSCOPY;  Surgeon: Carole Civil, MD;  Location: [facility];  Service: Orthopedics;  Laterality: Right;  .  SHOULDER OPEN ROTATOR CUFF REPAIR Right 07/03/2017   Procedure: ROTATOR CUFF REPAIR SHOULDER OPEN;  Surgeon: Carole Civil, MD;  Location: [facility];  Service: Orthopedics;  Laterality: Right;  . WRIST ARTHROSCOPY Right 12/08/2014   Procedure: RIGHT WRIST ARTHROSCOPY WITH DEBRIDEMENT/OR REPAIR;  Surgeon: Iran Planas, MD;  Location: [place];  Service: Orthopedics;  Laterality: Right;    There were no vitals filed for this visit.  Subjective Assessment - 10/01/17 0842    Currently in Pain?  Yes    Pain Score  8     Pain Location  Shoulder    Pain Orientation  Right    Pain Descriptors / Indicators  Sore;Aching    Pain Type  Acute pain    Pain Radiating Towards  upper arm, trapezius    Pain Onset  More than a month ago    Pain Frequency  Constant    Aggravating Factors   lying down at night, movement of arm    Pain Relieving Factors  heat and medication    Effect of Pain on Daily Activities  max effect on ADL tasks    Multiple Pain Sites  No         [facility] OT Assessment - 10/01/17 0843      Assessment   Medical Diagnosis  S/P Right Arthroscopic RCR and bursectomy  Precautions   Precautions  Shoulder    Precaution Comments  Dr. Aline Brochure protocol:  weeks 1-5 of therapy (through 5/15) P/ROM only with flexion limited to 100 and external rotation limited to 30.  5/16 and beyond, begin AA/ROM, progress to A/ROM and strengthening as tolerated                 OT Treatments/Exercises (OP) - 10/01/17 0843      Exercises   Exercises  Shoulder      Shoulder Exercises: Supine   Protraction  PROM;10 reps    Horizontal ABduction  PROM;10 reps    External Rotation  PROM;10 reps    External Rotation Limitations  to 30 per protocol    Internal Rotation  PROM;10 reps    Flexion  PROM;10 reps    Flexion Limitations  to 100 per protocol    ABduction  PROM;10 reps      Shoulder Exercises: Seated   Extension  AROM;10 reps    Row  AROM;10 reps    Other Seated Exercises  shoulder  depression, A/ROM, 10X      Shoulder Exercises: Therapy Ball   Flexion  10 reps    ABduction  10 reps      Shoulder Exercises: Isometric Strengthening   Flexion  Supine;3X3"    Extension  Supine;3X3"    External Rotation  Supine;3X3"    Internal Rotation  Supine;3X3"    ABduction  Supine;3X3"    ADduction  Supine;3X3"      Manual Therapy   Manual Therapy  Soft tissue mobilization    Manual therapy comments  manual therapy interventions completed seperately from all other interventions this date of service    Soft tissue mobilization  manual soft tissue mobilization to right upper arm, scapular, and shoulder region to decrease pain and restrictions and improve pain free mobility in order to return to use of RUE as dominant with daily tasks.                OT Short Term Goals - 09/11/17 1108      OT SHORT TERM GOAL #1   Title  Patient will be educated and independent with HEP for improved right shoulder mobility.     Time  4    Period  Weeks    Status  On-going      OT SHORT TERM GOAL #2   Title  Paitent will improve right shoulder P/ROM to [facility] in right shoulder for improved ability to don and doff clothing independently.     Time  4    Period  Weeks    Status  On-going      OT SHORT TERM GOAL #3   Title  Patient will improve right shoulder strength to 3+/5 for improved ability to reach to shoulder height during adl completion independently.     Time  4    Period  Weeks    Status  On-going      OT SHORT TERM GOAL #4   Title  Patient will decrease pain in his right shoulder to 5/10 or better during adl completion.     Time  4    Period  Weeks    Status  On-going      OT SHORT TERM GOAL #5   Title  Patient will decrease fascial restrictions from max to mod-max for greater mobility needed for adl completion.     Time  4    Period  Weeks  Status  On-going        OT Long Term Goals - 09/11/17 1108      OT LONG TERM GOAL #1   Title  Patient will use his  right arm as dominant with all functional B/IADLS, work, and leisure activities.     Time  8    Period  Weeks    Status  On-going      OT LONG TERM GOAL #2   Title  Paitent will improve right shoulder A/ROM to [facility] in right shoulder for improved ability to reach overhead at work and behind his head and back.     Time  8    Period  Weeks    Status  On-going      OT LONG TERM GOAL #3   Title  Patient will improve right shoulder strength to 5/5 for improved ability to lift items overhead at work.     Time  8    Period  Weeks    Status  On-going      OT LONG TERM GOAL #4   Title  Patient will decrease pain in his right shoulder to 3/10 or better during adl completion.    Time  8    Period  Weeks    Status  On-going      OT LONG TERM GOAL #5   Title  Patient will decrease fascial restrictions from mod-max to min for greater mobility needed for adl completion.     Time  8    Period  Weeks    Status  On-going            Plan - 10/01/17 6283    Clinical Impression Statement  A: Pt arrives today with increased pain in RUE with increased fascial restrictions and trigger points in anterior shoulder and upper trapezius area. Manual therapy completed to address. Patient tolerated passive stretching and exercises with pain. VC for form and technique.     Plan  P: Continue with manual therapy to address fascial restrictions. Continue to follow protocol. Follow up on MD appointment.     Consulted and Agree with Plan of Care  Patient       Patient will benefit from skilled therapeutic intervention in order to improve the following deficits and impairments:  Increased muscle spasms, Decreased coping skills, Decreased strength, Decreased range of motion, Pain, Impaired UE functional use  Visit Diagnosis: Acute pain of right shoulder  Stiffness of right shoulder, not elsewhere classified  Other symptoms and signs involving the musculoskeletal system    Problem List Patient Active  Problem List   Diagnosis Date Noted  . S/P right rotator cuff repair 07/03/17 07/08/2017  . Bursitis of right shoulder   . Ureteral stone with hydronephrosis 07/25/2016  . Abscess of finger of right hand    Ailene Ravel, OTR/L,CBIS  [phone]  10/01/2017, 9:15 AM  [place], [place], [address] Phone: [phone]   Fax:  [phone]  Name: John Jones MRN: [id number] Date of Birth: 05/26/1971

## 2017-10-03 ENCOUNTER — Telehealth (HOSPITAL_COMMUNITY): Payer: Self-pay

## 2017-10-03 ENCOUNTER — Ambulatory Visit (HOSPITAL_COMMUNITY): Payer: 59

## 2017-10-03 NOTE — Telephone Encounter (Signed)
Called patient regarding no show. Patient reports that he forgot about appointment as it was added to his schedule when he was here earlier this week. He was reminded of his next appointment.   Ailene Ravel, OTR/L,CBIS  [phone]

## 2017-10-06 ENCOUNTER — Ambulatory Visit (HOSPITAL_COMMUNITY): Payer: 59 | Admitting: Specialist

## 2017-10-07 ENCOUNTER — Other Ambulatory Visit: Payer: Self-pay | Admitting: Orthopedic Surgery

## 2017-10-07 DIAGNOSIS — Z9889 Other specified postprocedural states: Secondary | ICD-10-CM

## 2017-10-07 MED ORDER — HYDROCODONE-ACETAMINOPHEN 7.5-325 MG PO TABS: 1.0000 | ORAL_TABLET | ORAL | 0 refills | Status: DC | PRN

## 2017-10-07 NOTE — Telephone Encounter (Signed)
Hydrocodone-Acetaminophen  7.5/325 mg  Qty  42 Tablets  Take 1 tablet by mouth every 4 (four) hours as needed up to 7 days for  moderate pain.    PATIENT USES [place] ON [address]

## 2017-10-08 ENCOUNTER — Ambulatory Visit (HOSPITAL_COMMUNITY): Payer: 59

## 2017-10-08 ENCOUNTER — Ambulatory Visit (HOSPITAL_COMMUNITY): Payer: 59 | Admitting: Occupational Therapy

## 2017-10-08 ENCOUNTER — Telehealth (HOSPITAL_COMMUNITY): Payer: Self-pay

## 2017-10-08 ENCOUNTER — Encounter (HOSPITAL_COMMUNITY): Payer: Self-pay | Admitting: Occupational Therapy

## 2017-10-08 DIAGNOSIS — M25511 Pain in right shoulder: Secondary | ICD-10-CM

## 2017-10-08 DIAGNOSIS — R29898 Other symptoms and signs involving the musculoskeletal system: Secondary | ICD-10-CM

## 2017-10-08 DIAGNOSIS — M25611 Stiffness of right shoulder, not elsewhere classified: Secondary | ICD-10-CM

## 2017-10-08 NOTE — Telephone Encounter (Signed)
Called patient regarding no show. Patient apologized. Pt did reschedule for this afternoon at 1:45PM. Informed patient that if he no showed for any more appointments he would only be able to schedule one appointment at a time. Patient verbalized understanding. Requested that patient called and cancel and reschedule when he is unable to attend a therapy appointment.   Ailene Ravel, OTR/L,CBIS  [phone]

## 2017-10-08 NOTE — Patient Instructions (Signed)
1) Seated Row   Sit up straight with elbows by your sides. Pull back with shoulders/elbows, keeping forearms straight, as if pulling back on the reins of a horse. Squeeze shoulder blades together. Repeat _15__times, _2___sets/day    2) Shoulder Elevation    Sit up straight with arms by your sides. Slowly bring your shoulders up towards your ears. Repeat__15_times, __2__ sets/day    3) Shoulder Extension    Sit up straight with both arms by your side, draw your arms back behind your waist. Keep your elbows straight. Repeat _15___times, __2__sets/day.

## 2017-10-08 NOTE — Therapy (Signed)
[place], [place], [address] Phone: [phone]   Fax:  [phone]  Occupational Therapy Treatment  Patient Details  Name: John Jones MRN: [id number] Date of Birth: 18-Aug-1970 Referring Provider: Dr. Arther Abbott   Encounter Date: 10/08/2017  OT End of Session - 10/08/17 1423    Visit Number  4    Number of Visits  16    Date for OT Re-Evaluation  11/09/17 mini reassess on 10/10/17    Authorization Type  [employer] - 15 visit limit    Authorization - Visit Number  4    Authorization - Number of Visits  60    OT Start Time  0623    OT Stop Time  1425    OT Time Calculation (min)  38 min    Activity Tolerance  Patient tolerated treatment well    Behavior During Therapy  [facility] for tasks assessed/performed       Past Medical History:  Diagnosis Date  . History of kidney stones    multiple episodes  . Migraine   . Right ureteral stone   . Sleep apnea    Uses CPAP    Past Surgical History:  Procedure Laterality Date  . CARPAL TUNNEL RELEASE Right 12/08/2014   Procedure: RIGHT CARPAL TUNNEL RELEASE;  Surgeon: Iran Planas, MD;  Location: [place];  Service: Orthopedics;  Laterality: Right;  . CYSTOSCOPY Right 12/2015   stent placed and removed   . CYSTOSCOPY W/ URETERAL STENT PLACEMENT Right 07/25/2016   Procedure: CYSTOSCOPY/RETROGRADE/URETEROSCOPY/STENT PLACEMENT;  Surgeon: Alexis Frock, MD;  Location: [facility];  Service: Urology;  Laterality: Right;  . INCISION AND DRAINAGE Right 01/19/2016   Procedure: INCISION AND DRAINAGE RIGHT LONG FINGER;  Surgeon: Carole Civil, MD;  Location: [facility];  Service: Orthopedics;  Laterality: Right;  RIGHT LONG FINGER - PT COMING AT 9:00 FOR LABWORK  . SHOULDER ARTHROSCOPY WITH OPEN ROTATOR CUFF REPAIR Right 07/03/2017   Procedure: SHOULDER ARTHROSCOPY;  Surgeon: Carole Civil, MD;  Location: [facility];  Service: Orthopedics;  Laterality: Right;  .  SHOULDER OPEN ROTATOR CUFF REPAIR Right 07/03/2017   Procedure: ROTATOR CUFF REPAIR SHOULDER OPEN;  Surgeon: Carole Civil, MD;  Location: [facility];  Service: Orthopedics;  Laterality: Right;  . WRIST ARTHROSCOPY Right 12/08/2014   Procedure: RIGHT WRIST ARTHROSCOPY WITH DEBRIDEMENT/OR REPAIR;  Surgeon: Iran Planas, MD;  Location: [place];  Service: Orthopedics;  Laterality: Right;    There were no vitals filed for this visit.  Subjective Assessment - 10/08/17 1347    Subjective   S: I woke up about 3 times last night.     Currently in Pain?  Yes    Pain Score  5     Pain Location  Shoulder    Pain Orientation  Right    Pain Descriptors / Indicators  Aching;Sore    Pain Type  Acute pain    Pain Radiating Towards  to elbow sometimes    Pain Onset  More than a month ago    Pain Frequency  Constant    Aggravating Factors   lying down at night, movement of arm    Pain Relieving Factors  heat and medication    Effect of Pain on Daily Activities  max effect on ADL tasks    Multiple Pain Sites  No         [facility] OT Assessment - 10/08/17 1347      Assessment  Medical Diagnosis  S/P Right Arthroscopic RCR and bursectomy      Precautions   Precautions  Shoulder    Precaution Comments  Dr. Aline Brochure protocol:  weeks 1-5 of therapy (through 5/15) P/ROM only with flexion limited to 100 and external rotation limited to 30.  5/16 and beyond, begin AA/ROM, progress to A/ROM and strengthening as tolerated        Restrictions   Weight Bearing Restrictions  Yes    RUE Weight Bearing  Non weight bearing               OT Treatments/Exercises (OP) - 10/08/17 1350      Exercises   Exercises  Shoulder      Shoulder Exercises: Supine   Protraction  PROM;10 reps    Horizontal ABduction  PROM;10 reps    External Rotation  PROM;10 reps    External Rotation Limitations  to 30 per protocol    Internal Rotation  PROM;10 reps    Flexion  PROM;10 reps    Flexion Limitations  to 100 per  protocol    ABduction  PROM;10 reps      Shoulder Exercises: Seated   Extension  AROM;10 reps    Row  AROM;10 reps    Other Seated Exercises  shoulder depression, A/ROM, 10X      Shoulder Exercises: Therapy Ball   Flexion  15 reps    ABduction  15 reps      Shoulder Exercises: ROM/Strengthening   Thumb Tacks  1'-low level    Prot/Ret//Elev/Dep  1'      Shoulder Exercises: Isometric Strengthening   Flexion  Supine;3X5"    Extension  Supine;3X5"    External Rotation  Supine;3X5"    Internal Rotation  Supine;3X5"    ABduction  Supine;3X5"    ADduction  Supine;3X5"      Manual Therapy   Manual Therapy  Soft tissue mobilization    Manual therapy comments  manual therapy interventions completed seperately from all other interventions this date of service    Soft tissue mobilization  manual soft tissue mobilization to right upper arm, scapular, and shoulder region to decrease pain and restrictions and improve pain free mobility in order to return to use of RUE as dominant with daily tasks.              OT Education - 10/08/17 1423    Education provided  Yes    Education Details  scapular A/ROM    Person(s) Educated  Patient    Methods  Explanation;Demonstration;Handout    Comprehension  Verbalized understanding;Returned demonstration       OT Short Term Goals - 09/11/17 1108      OT SHORT TERM GOAL #1   Title  Patient will be educated and independent with HEP for improved right shoulder mobility.     Time  4    Period  Weeks    Status  On-going      OT SHORT TERM GOAL #2   Title  Paitent will improve right shoulder P/ROM to [facility] in right shoulder for improved ability to don and doff clothing independently.     Time  4    Period  Weeks    Status  On-going      OT SHORT TERM GOAL #3   Title  Patient will improve right shoulder strength to 3+/5 for improved ability to reach to shoulder height during adl completion independently.     Time  4  Period  Weeks     Status  On-going      OT SHORT TERM GOAL #4   Title  Patient will decrease pain in his right shoulder to 5/10 or better during adl completion.     Time  4    Period  Weeks    Status  On-going      OT SHORT TERM GOAL #5   Title  Patient will decrease fascial restrictions from max to mod-max for greater mobility needed for adl completion.     Time  4    Period  Weeks    Status  On-going        OT Long Term Goals - 09/11/17 1108      OT LONG TERM GOAL #1   Title  Patient will use his right arm as dominant with all functional B/IADLS, work, and leisure activities.     Time  8    Period  Weeks    Status  On-going      OT LONG TERM GOAL #2   Title  Paitent will improve right shoulder A/ROM to [facility] in right shoulder for improved ability to reach overhead at work and behind his head and back.     Time  8    Period  Weeks    Status  On-going      OT LONG TERM GOAL #3   Title  Patient will improve right shoulder strength to 5/5 for improved ability to lift items overhead at work.     Time  8    Period  Weeks    Status  On-going      OT LONG TERM GOAL #4   Title  Patient will decrease pain in his right shoulder to 3/10 or better during adl completion.    Time  8    Period  Weeks    Status  On-going      OT LONG TERM GOAL #5   Title  Patient will decrease fascial restrictions from mod-max to min for greater mobility needed for adl completion.     Time  8    Period  Weeks    Status  On-going            Plan - 10/08/17 1423    Clinical Impression Statement  A: Continued to follow protocol from MD, limiting P/ROM flexion and er. Pt able to tolerate abduction to full range today, also seemingly less painful/tender during manual therapy while was completed at beginning of session to address fascial restrictions and trigger points. Continued with passive exercises, added low thumb tacks. Verbal cuing for form and technique.     Plan  P: Continue to follow protocol,  mini-reassessment       Patient will benefit from skilled therapeutic intervention in order to improve the following deficits and impairments:  Increased muscle spasms, Decreased coping skills, Decreased strength, Decreased range of motion, Pain, Impaired UE functional use  Visit Diagnosis: Acute pain of right shoulder  Stiffness of right shoulder, not elsewhere classified  Other symptoms and signs involving the musculoskeletal system    Problem List Patient Active Problem List   Diagnosis Date Noted  . S/P right rotator cuff repair 07/03/17 07/08/2017  . Bursitis of right shoulder   . Ureteral stone with hydronephrosis 07/25/2016  . Abscess of finger of right hand    Guadelupe Sabin, OTR/L  [phone] 10/08/2017, 2:26 PM  [place], [place],  [place] Phone: [phone]   Fax:  [phone]  Name: ALFONSO CARDEN MRN: [id number] Date of Birth: 03/12/1971

## 2017-10-09 DIAGNOSIS — Z6829 Body mass index (BMI) 29.0-29.9, adult: Secondary | ICD-10-CM | POA: Diagnosis not present

## 2017-10-09 DIAGNOSIS — R030 Elevated blood-pressure reading, without diagnosis of hypertension: Secondary | ICD-10-CM | POA: Diagnosis not present

## 2017-10-09 DIAGNOSIS — F17200 Nicotine dependence, unspecified, uncomplicated: Secondary | ICD-10-CM | POA: Diagnosis not present

## 2017-10-13 ENCOUNTER — Ambulatory Visit (HOSPITAL_COMMUNITY): Payer: 59 | Admitting: Specialist

## 2017-10-13 DIAGNOSIS — R29898 Other symptoms and signs involving the musculoskeletal system: Secondary | ICD-10-CM

## 2017-10-13 DIAGNOSIS — M25511 Pain in right shoulder: Secondary | ICD-10-CM | POA: Diagnosis not present

## 2017-10-13 DIAGNOSIS — M25611 Stiffness of right shoulder, not elsewhere classified: Secondary | ICD-10-CM

## 2017-10-13 NOTE — Therapy (Signed)
[place], [place], [address] Phone: [phone]   Fax:  [phone]  Occupational Therapy Treatment  Patient Details  Name: John Jones MRN: [id number] Date of Birth: 04/09/1971 Referring Provider: Dr. Arther Abbott   Encounter Date: 10/13/2017  OT End of Session - 10/13/17 1117    Visit Number  5    Number of Visits  16    Date for OT Re-Evaluation  11/09/17    Authorization Type  [employer] - 60 visit limit    Authorization - Visit Number  5    Authorization - Number of Visits  60    OT Start Time  3664    OT Stop Time  1115    OT Time Calculation (min)  36 min    Activity Tolerance  Patient tolerated treatment well    Behavior During Therapy  [facility] for tasks assessed/performed       Past Medical History:  Diagnosis Date  . History of kidney stones    multiple episodes  . Migraine   . Right ureteral stone   . Sleep apnea    Uses CPAP    Past Surgical History:  Procedure Laterality Date  . CARPAL TUNNEL RELEASE Right 12/08/2014   Procedure: RIGHT CARPAL TUNNEL RELEASE;  Surgeon: Iran Planas, MD;  Location: [place];  Service: Orthopedics;  Laterality: Right;  . CYSTOSCOPY Right 12/2015   stent placed and removed   . CYSTOSCOPY W/ URETERAL STENT PLACEMENT Right 07/25/2016   Procedure: CYSTOSCOPY/RETROGRADE/URETEROSCOPY/STENT PLACEMENT;  Surgeon: Alexis Frock, MD;  Location: [facility];  Service: Urology;  Laterality: Right;  . INCISION AND DRAINAGE Right 01/19/2016   Procedure: INCISION AND DRAINAGE RIGHT LONG FINGER;  Surgeon: Carole Civil, MD;  Location: [facility];  Service: Orthopedics;  Laterality: Right;  RIGHT LONG FINGER - PT COMING AT 9:00 FOR LABWORK  . SHOULDER ARTHROSCOPY WITH OPEN ROTATOR CUFF REPAIR Right 07/03/2017   Procedure: SHOULDER ARTHROSCOPY;  Surgeon: Carole Civil, MD;  Location: [facility];  Service: Orthopedics;  Laterality: Right;  . SHOULDER OPEN ROTATOR  CUFF REPAIR Right 07/03/2017   Procedure: ROTATOR CUFF REPAIR SHOULDER OPEN;  Surgeon: Carole Civil, MD;  Location: [facility];  Service: Orthopedics;  Laterality: Right;  . WRIST ARTHROSCOPY Right 12/08/2014   Procedure: RIGHT WRIST ARTHROSCOPY WITH DEBRIDEMENT/OR REPAIR;  Surgeon: Iran Planas, MD;  Location: [place];  Service: Orthopedics;  Laterality: Right;    There were no vitals filed for this visit.      [facility] OT Assessment - 10/13/17 0001      Assessment   Medical Diagnosis  S/P Right Arthroscopic RCR and bursectomy      Precautions   Precautions  Shoulder    Precaution Comments  Dr. Aline Brochure protocol:  weeks 1-5 of therapy (through 5/15) P/ROM only with flexion limited to 100 and external rotation limited to 30.  5/16 and beyond, begin AA/ROM, progress to A/ROM and strengthening as tolerated        ADL   ADL comments  unable to use his right arm with functional activities above waist height       Observation/Other Assessments   Focus on Therapeutic Outcomes (FOTO)   49/100      PROM   Overall PROM Comments  assessed in supine, external and internal rotation with shoulder adducted (initial evaluation)    Right Shoulder Flexion  130 Degrees 70    Right Shoulder ABduction  150 Degrees 75  Right Shoulder Internal Rotation  80 Degrees 54    Right Shoulder External Rotation  30 Degrees 45               OT Treatments/Exercises (OP) - 10/13/17 0001      Shoulder Exercises: Supine   Protraction  PROM;10 reps    Horizontal ABduction  PROM;10 reps    External Rotation  PROM;10 reps    External Rotation Limitations  to 30 per protocol    Internal Rotation  PROM;10 reps    Flexion  PROM;10 reps    Flexion Limitations  to 100 per protocol    ABduction  PROM;10 reps    Other Supine Exercises  serratus anterior punch with therapist unweighting arm 10 times      Shoulder Exercises: Seated   Elevation  AROM;15 reps    Extension  AROM;15 reps    Row  AROM;15 reps     Other Seated Exercises  shoulder depression, A/ROM, 10X      Shoulder Exercises: Therapy Ball   Flexion  15 reps    ABduction  15 reps      Shoulder Exercises: ROM/Strengthening   Thumb Tacks  1' low level    Prot/Ret//Elev/Dep  1'      Shoulder Exercises: Isometric Strengthening   Flexion  Supine;5X5"    Extension  Supine;5X5"    External Rotation  Supine;5X5"    Internal Rotation  Supine;5X5"    ABduction  Supine;5X5"    ADduction  Supine;5X5"      Manual Therapy   Manual Therapy  Soft tissue mobilization    Manual therapy comments  manual therapy interventions completed seperately from all other interventions this date of service    Soft tissue mobilization  manual soft tissue mobilization to right upper arm, scapular, and shoulder region to decrease pain and restrictions and improve pain free mobility in order to return to use of RUE as dominant with daily tasks.                OT Short Term Goals - 10/13/17 1100      OT SHORT TERM GOAL #1   Title  Patient will be educated and independent with HEP for improved right shoulder mobility.     Time  4    Period  Weeks    Status  On-going      OT SHORT TERM GOAL #2   Title  Paitent will improve right shoulder P/ROM to [facility] in right shoulder for improved ability to don and doff clothing independently.     Time  4    Period  Weeks    Status  Partially Met      OT SHORT TERM GOAL #3   Title  Patient will improve right shoulder strength to 3+/5 for improved ability to reach to shoulder height during adl completion independently.     Time  4    Period  Weeks    Status  On-going      OT SHORT TERM GOAL #4   Title  Patient will decrease pain in his right shoulder to 5/10 or better during adl completion.     Time  4    Period  Weeks    Status  Achieved      OT SHORT TERM GOAL #5   Title  Patient will decrease fascial restrictions from max to mod-max for greater mobility needed for adl completion.     Time  4     Period  Weeks    Status  Achieved        OT Long Term Goals - 09/11/17 1108      OT LONG TERM GOAL #1   Title  Patient will use his right arm as dominant with all functional B/IADLS, work, and leisure activities.     Time  8    Period  Weeks    Status  On-going      OT LONG TERM GOAL #2   Title  Paitent will improve right shoulder A/ROM to [facility] in right shoulder for improved ability to reach overhead at work and behind his head and back.     Time  8    Period  Weeks    Status  On-going      OT LONG TERM GOAL #3   Title  Patient will improve right shoulder strength to 5/5 for improved ability to lift items overhead at work.     Time  8    Period  Weeks    Status  On-going      OT LONG TERM GOAL #4   Title  Patient will decrease pain in his right shoulder to 3/10 or better during adl completion.    Time  8    Period  Weeks    Status  On-going      OT LONG TERM GOAL #5   Title  Patient will decrease fascial restrictions from mod-max to min for greater mobility needed for adl completion.     Time  8    Period  Weeks    Status  On-going            Plan - 10/13/17 1117    Clinical Impression Statement  A:  Mini reassessment completed this date with improvements noted in P/ROM, within protocol.  Unable to use right arm with functional activities above waist height.  Patient will benefit from continued skilled OT intervention to improve shoulder mobilty and strrength needed to return to prior level of function with all b/iadls work and leisure activities.      Plan  P: Continue to follow protocol, begin aa/rom in supine and seated as tolerated.        Patient will benefit from skilled therapeutic intervention in order to improve the following deficits and impairments:  Increased muscle spasms, Decreased coping skills, Decreased strength, Decreased range of motion, Pain, Impaired UE functional use  Visit Diagnosis: Acute pain of right shoulder  Stiffness of right  shoulder, not elsewhere classified  Other symptoms and signs involving the musculoskeletal system    Problem List Patient Active Problem List   Diagnosis Date Noted  . S/P right rotator cuff repair 07/03/17 07/08/2017  . Bursitis of right shoulder   . Ureteral stone with hydronephrosis 07/25/2016  . Abscess of finger of right hand     Vangie Bicker, [place], OTR/L [phone]  10/13/2017, 11:19 AM  [place], [place], [address] Phone: [phone]   Fax:  [phone]  Name: John Jones MRN: [id number] Date of Birth: March 22, 1971

## 2017-10-14 ENCOUNTER — Other Ambulatory Visit: Payer: Self-pay | Admitting: Orthopedic Surgery

## 2017-10-14 DIAGNOSIS — Z9889 Other specified postprocedural states: Secondary | ICD-10-CM

## 2017-10-14 MED ORDER — HYDROCODONE-ACETAMINOPHEN 7.5-325 MG PO TABS: 1.0000 | ORAL_TABLET | Freq: Four times a day (QID) | ORAL | 0 refills | Status: DC | PRN

## 2017-10-14 NOTE — Telephone Encounter (Signed)
Patient requests refill:  HYDROcodone-acetaminophen (NORCO) 7.5-325 MG tablet 42 tablet  - [facility] on [address], [place]

## 2017-10-15 ENCOUNTER — Ambulatory Visit (HOSPITAL_COMMUNITY): Payer: 59 | Admitting: Occupational Therapy

## 2017-10-20 ENCOUNTER — Other Ambulatory Visit: Payer: Self-pay | Admitting: Orthopedic Surgery

## 2017-10-20 ENCOUNTER — Encounter (HOSPITAL_COMMUNITY): Payer: 59 | Admitting: Specialist

## 2017-10-20 DIAGNOSIS — G4733 Obstructive sleep apnea (adult) (pediatric): Secondary | ICD-10-CM | POA: Diagnosis not present

## 2017-10-20 DIAGNOSIS — Z9889 Other specified postprocedural states: Secondary | ICD-10-CM

## 2017-10-20 MED ORDER — HYDROCODONE-ACETAMINOPHEN 7.5-325 MG PO TABS: 1.0000 | ORAL_TABLET | Freq: Three times a day (TID) | ORAL | 0 refills | Status: AC | PRN

## 2017-10-20 NOTE — Telephone Encounter (Signed)
Patient called for refill: (said quantity was cut down to 28) HYDROcodone-acetaminophen (NORCO) 7.5-325 MG tablet 28 tablet  - [facility], [address], [place]

## 2017-10-22 ENCOUNTER — Telehealth (HOSPITAL_COMMUNITY): Payer: Self-pay

## 2017-10-22 ENCOUNTER — Ambulatory Visit (HOSPITAL_COMMUNITY): Payer: 59

## 2017-10-22 NOTE — Telephone Encounter (Signed)
No transportation °

## 2017-10-23 ENCOUNTER — Telehealth (HOSPITAL_COMMUNITY): Payer: Self-pay | Admitting: Occupational Therapy

## 2017-10-23 DIAGNOSIS — E785 Hyperlipidemia, unspecified: Secondary | ICD-10-CM | POA: Diagnosis not present

## 2017-10-23 DIAGNOSIS — I10 Essential (primary) hypertension: Secondary | ICD-10-CM | POA: Diagnosis not present

## 2017-10-23 NOTE — Telephone Encounter (Signed)
Pt needed later apptment time today 10/24/17

## 2017-10-24 ENCOUNTER — Ambulatory Visit (HOSPITAL_COMMUNITY): Payer: 59 | Admitting: Occupational Therapy

## 2017-10-24 DIAGNOSIS — K5900 Constipation, unspecified: Secondary | ICD-10-CM | POA: Diagnosis not present

## 2017-10-24 DIAGNOSIS — Z0000 Encounter for general adult medical examination without abnormal findings: Secondary | ICD-10-CM | POA: Diagnosis not present

## 2017-10-24 DIAGNOSIS — F17200 Nicotine dependence, unspecified, uncomplicated: Secondary | ICD-10-CM | POA: Diagnosis not present

## 2017-10-28 ENCOUNTER — Telehealth (HOSPITAL_COMMUNITY): Payer: Self-pay

## 2017-10-28 ENCOUNTER — Other Ambulatory Visit: Payer: Self-pay | Admitting: Orthopedic Surgery

## 2017-10-28 ENCOUNTER — Ambulatory Visit (HOSPITAL_COMMUNITY): Payer: 59

## 2017-10-28 MED ORDER — HYDROCODONE-ACETAMINOPHEN 7.5-325 MG PO TABS: 1.0000 | ORAL_TABLET | Freq: Four times a day (QID) | ORAL | 0 refills | Status: DC | PRN

## 2017-10-28 NOTE — Telephone Encounter (Signed)
Called patient regarding no show. Patient was aware and informed that previously he would only be able to schedule one appointment at a time if he had another no show. Patient was informed that from now on we will only schedule one therapy visit at a time. Pt was reminded of next appointment and to call to reschedule if he was unable to make it.   Ailene Ravel, OTR/L,CBIS  [phone]

## 2017-10-28 NOTE — Telephone Encounter (Signed)
Patient requests refill on Hydrocodone/Acetaminophen 7.5-325  Mgs.   Qty  21       Sig: Take 1 tablet by mouth every 8 (eight) hours as needed for up to 7 days for moderate pain.   Patient states he uses [facility] on [address]

## 2017-10-30 ENCOUNTER — Ambulatory Visit (HOSPITAL_COMMUNITY): Payer: 59

## 2017-11-03 ENCOUNTER — Telehealth: Payer: Self-pay | Admitting: Orthopedic Surgery

## 2017-11-03 ENCOUNTER — Encounter (HOSPITAL_COMMUNITY): Payer: 59 | Admitting: Specialist

## 2017-11-03 ENCOUNTER — Other Ambulatory Visit: Payer: Self-pay | Admitting: Orthopedic Surgery

## 2017-11-03 MED ORDER — HYDROCODONE-ACETAMINOPHEN 5-325 MG PO TABS: 1.0000 | ORAL_TABLET | ORAL | 0 refills | Status: DC | PRN

## 2017-11-03 NOTE — Telephone Encounter (Signed)
Patient requests refill on Hydrocodone/Acetaminophen (Norco)  7.5-325  Mgs.   Qty  21  Sig: Take 1 tablet by mouth every 6 (six) hours as needed for moderate pain.  Patient states he uses [facility] on [address]

## 2017-11-03 NOTE — Progress Notes (Signed)
560 score on data bank   I decreased his opioid medication to Norco 5 mg every 4 I will continue the weaning process.  In review this patient had significant excessive and unusual pain postop appeared to be somewhat neurogenic as if he had some type of brachial plexopathy or neurogenic pain  We did get his pain under control and got him to therapy where he can tolerate that.  His opioid score was very high.  We should be able to stop the diazepam now.

## 2017-11-07 ENCOUNTER — Encounter (HOSPITAL_COMMUNITY): Payer: 59 | Admitting: Specialist

## 2017-11-10 ENCOUNTER — Other Ambulatory Visit: Payer: Self-pay | Admitting: Orthopedic Surgery

## 2017-11-10 ENCOUNTER — Encounter (HOSPITAL_COMMUNITY): Payer: 59 | Admitting: Specialist

## 2017-11-10 NOTE — Telephone Encounter (Signed)
Hydrocodone-Acetaminophen  5/325 mg Qty 28 Tablets  Take 1 tablet by mouth every 4 (four) hours as needed for up to 7 days for moderate pain.  PATIENT USES [address]

## 2017-11-11 MED ORDER — HYDROCODONE-ACETAMINOPHEN 5-325 MG PO TABS: 1.0000 | ORAL_TABLET | ORAL | 0 refills | Status: DC | PRN

## 2017-11-12 ENCOUNTER — Encounter: Payer: Self-pay | Admitting: Orthopedic Surgery

## 2017-11-12 ENCOUNTER — Ambulatory Visit (INDEPENDENT_AMBULATORY_CARE_PROVIDER_SITE_OTHER): Payer: 59 | Admitting: Orthopedic Surgery

## 2017-11-12 VITALS — BP 124/85 | HR 92 | Ht 65.0 in | Wt 170.0 lb

## 2017-11-12 DIAGNOSIS — Z9889 Other specified postprocedural states: Secondary | ICD-10-CM

## 2017-11-12 NOTE — Patient Instructions (Signed)
Steps to Quit Smoking Smoking tobacco can be bad for your health. It can also affect almost every organ in your body. Smoking puts you and people around you at risk for many serious Chiante Peden-lasting (chronic) diseases. Quitting smoking is hard, but it is one of the best things that you can do for your health. It is never too late to quit. What are the benefits of quitting smoking? When you quit smoking, you lower your risk for getting serious diseases and conditions. They can include:  Lung cancer or lung disease.  Heart disease.  Stroke.  Heart attack.  Not being able to have children (infertility).  Weak bones (osteoporosis) and broken bones (fractures).  If you have coughing, wheezing, and shortness of breath, those symptoms may get better when you quit. You may also get sick less often. If you are pregnant, quitting smoking can help to lower your chances of having a baby of low birth weight. What can I do to help me quit smoking? Talk with your doctor about what can help you quit smoking. Some things you can do (strategies) include:  Quitting smoking totally, instead of slowly cutting back how much you smoke over a period of time.  Going to in-person counseling. You are more likely to quit if you go to many counseling sessions.  Using resources and support systems, such as: ? Online chats with a counselor. ? Phone quitlines. ? Printed self-help materials. ? Support groups or group counseling. ? Text messaging programs. ? Mobile phone apps or applications.  Taking medicines. Some of these medicines may have nicotine in them. If you are pregnant or breastfeeding, do not take any medicines to quit smoking unless your doctor says it is okay. Talk with your doctor about counseling or other things that can help you.  Talk with your doctor about using more than one strategy at the same time, such as taking medicines while you are also going to in-person counseling. This can help make  quitting easier. What things can I do to make it easier to quit? Quitting smoking might feel very hard at first, but there is a lot that you can do to make it easier. Take these steps:  Talk to your family and friends. Ask them to support and encourage you.  Call phone quitlines, reach out to support groups, or work with a counselor.  Ask people who smoke to not smoke around you.  Avoid places that make you want (trigger) to smoke, such as: ? Bars. ? Parties. ? Smoke-break areas at work.  Spend time with people who do not smoke.  Lower the stress in your life. Stress can make you want to smoke. Try these things to help your stress: ? Getting regular exercise. ? Deep-breathing exercises. ? Yoga. ? Meditating. ? Doing a body scan. To do this, close your eyes, focus on one area of your body at a time from head to toe, and notice which parts of your body are tense. Try to relax the muscles in those areas.  Download or buy apps on your mobile phone or tablet that can help you stick to your quit plan. There are many free apps, such as QuitGuide from the CDC (Centers for Disease Control and Prevention). You can find more support from smokefree.gov and other websites.  This information is not intended to replace advice given to you by your health care provider. Make sure you discuss any questions you have with your health care provider. 

## 2017-11-12 NOTE — Progress Notes (Signed)
Teen follow-up visit    Chief Complaint  Patient presents with  . Follow-up    S/P right rotator cuff repair 07/03/17    4 and half months ago patient had rotator cuff on January 31 she is doing well now he has full forward elevation 120 degrees of abduction with soreness at night when sleeping on his right side and some stiffness early in the morning  His PT notes indicate he still has some cuff weakness which she can work on with strengthening exercises  Encounter Diagnosis  Name Primary?  . S/P right rotator cuff repair 07/03/17 Yes    Follow-up in 1 month

## 2017-11-18 ENCOUNTER — Other Ambulatory Visit: Payer: Self-pay | Admitting: Orthopedic Surgery

## 2017-11-18 DIAGNOSIS — I10 Essential (primary) hypertension: Secondary | ICD-10-CM | POA: Diagnosis not present

## 2017-11-18 NOTE — Telephone Encounter (Signed)
Patient requests refill: HYDROcodone-acetaminophen (NORCO/VICODIN) 5-325 MG tablet 28 tablet  - [facility], [address], [place]

## 2017-11-19 ENCOUNTER — Other Ambulatory Visit: Payer: Self-pay | Admitting: Orthopedic Surgery

## 2017-11-19 MED ORDER — HYDROCODONE-ACETAMINOPHEN 5-325 MG PO TABS: 1.0000 | ORAL_TABLET | ORAL | 0 refills | Status: DC | PRN

## 2017-11-20 DIAGNOSIS — G4733 Obstructive sleep apnea (adult) (pediatric): Secondary | ICD-10-CM | POA: Diagnosis not present

## 2017-11-21 ENCOUNTER — Encounter (HOSPITAL_COMMUNITY): Payer: Self-pay | Admitting: Occupational Therapy

## 2017-11-21 NOTE — Therapy (Signed)
[place], [place], [address] Phone: [phone]   Fax:  [phone]  Patient Details  Name: John Jones MRN: [id number] Date of Birth: 02/16/1971 Referring Provider:  No ref. provider found  Encounter Date: 11/21/2017   OCCUPATIONAL THERAPY DISCHARGE SUMMARY  Visits from Start of Care: 5  Current functional level related to goals / functional outcomes: Unknown. Pt has not returned for therapy since last visit on 10/13/17.    Remaining deficits: Unknown.    Education / Equipment: HEP  Plan: Patient agrees to discharge.  Patient goals were not met. Patient is being discharged due to not returning since the last visit.  ?????       Guadelupe Sabin, OTR/L  [phone] 11/21/2017, 4:02 PM  [place] [address] [place], [place], [address] Phone: [phone]   Fax:  [phone]

## 2017-11-26 ENCOUNTER — Other Ambulatory Visit: Payer: Self-pay | Admitting: Orthopedic Surgery

## 2017-11-26 NOTE — Telephone Encounter (Signed)
Patient called for refill:  HYDROcodone-acetaminophen (NORCO/VICODIN) 5-325 MG tablet 28 tablet  -[facility], [address], [place], [place]

## 2017-11-27 MED ORDER — HYDROCODONE-ACETAMINOPHEN 5-325 MG PO TABS: 1.0000 | ORAL_TABLET | ORAL | 0 refills | Status: DC | PRN

## 2017-12-02 DIAGNOSIS — I10 Essential (primary) hypertension: Secondary | ICD-10-CM | POA: Diagnosis not present

## 2017-12-03 ENCOUNTER — Other Ambulatory Visit: Payer: Self-pay | Admitting: Orthopedic Surgery

## 2017-12-03 MED ORDER — HYDROCODONE-ACETAMINOPHEN 5-325 MG PO TABS: 1.0000 | ORAL_TABLET | Freq: Four times a day (QID) | ORAL | 0 refills | Status: DC | PRN

## 2017-12-03 NOTE — Telephone Encounter (Signed)
Hydrocodone-Acetaminophen 5/325 mg Qty 28 Tablets  Take 1 tablet by mouth every 4 (four) hours as needed for up to 7 days for moderate pain.  PATIENT USES [facility] ON [address]

## 2017-12-12 ENCOUNTER — Ambulatory Visit: Payer: 59 | Admitting: Orthopedic Surgery

## 2017-12-12 VITALS — BP 151/94 | HR 76 | Ht 65.0 in | Wt 170.0 lb

## 2017-12-12 DIAGNOSIS — Z9889 Other specified postprocedural states: Secondary | ICD-10-CM | POA: Diagnosis not present

## 2017-12-12 DIAGNOSIS — M65321 Trigger finger, right index finger: Secondary | ICD-10-CM | POA: Diagnosis not present

## 2017-12-12 MED ORDER — HYDROCODONE-ACETAMINOPHEN 5-325 MG PO TABS: 1.0000 | ORAL_TABLET | Freq: Four times a day (QID) | ORAL | 0 refills | Status: AC | PRN

## 2017-12-12 NOTE — Patient Instructions (Signed)
Use the hydrocodone less  Try muscle cream , tylenol, or advil for pain   What You Need to Know About Prescription Opioid Pain Medicine        Please be advised. You are on a medication which is classified as an "opiod". The CDC the [facility]  has recently advised all providers to advise patient's that these medications have certain risks which include but are not limited to:    drug intolerance  drug addiction  respiratory depression   respiratory failure  Death  Please keep these medications locked away. If you feel that you are becoming addicted to these medicines or you are having difficulties with these medications please alert your provider.   As your provider I will attempt to wean you off of these medications when you're severe acute pain has been taking care of. However, if we cannot wean you off of this medication you will be sent to a pain management center where they can better manage chronic pain   Opioids are powerful medicines that are used to treat moderate to severe pain. Opioids should be taken with the supervision of a trained health care provider. They should be taken for the shortest period of time as possible. This is because opioids can be addictive and the longer you take opioids, the greater your risk of addiction (opioid use disorder). What do opioids do? Opioids help reduce or eliminate pain. When used for short periods of time, they can help you:  Sleep better.  Do better in physical or occupational therapy.  Feel better in the first few days after an injury.  Recover from surgery. What kind of problems can opioids cause? Opioids can cause side effects, such as:  Constipation.  Nausea.  Vomiting.  Drowsiness.  Confusion.  Opioid use disorder.  Breathing difficulties (respiratory depression). Using opioid pain medicines for longer than 3 days increases your risk of these side effects. Taking opioid pain medicine  for a long period of time can affect your ability to do daily tasks. It also puts you at risk for:  Car accidents.  Heart attack.  Overdose, which can sometimes lead to death. What can increase my risk for developing problems while taking opioids? You may be at an especially high risk for problems while taking opioids if you:  Are over the age of 52.  Are pregnant.  Have kidney or liver disease.  Have certain mental health conditions, such as depression or anxiety.  Have a history of substance use disorder.  Have had an opioid overdose in the past. How do I stop taking opioids if I have been taking them for a long time? If you have been taking opioid medicine for more than a few weeks, you may need to slowly stop taking them (taper). Tapering your use of opioids can decrease your chances of experiencing withdrawal symptoms, such as:  Abdominal pain and cramping.  Nausea.  Sweating.  Sleepiness.  Restlessness.  Uncontrollable shaking (tremors).  Cravings for the medicine. Do not attempt to taper your use of opioids on your own. Talk with your health care provider about how to do this. Your health care provider may prescribe a step-down schedule based on how much medicine you are taking and how long you have been taking it. What are the benefits of stopping the use of opioids? By switching from opioid pain medicine to non-opioid pain management options, you will decrease your risk of accidents and injuries associated with long-term opioid use.  You will also be able to:  Monitor your pain more accurately and know when to seek medical care if it is not improving.  Decrease risk to others around you. Having opioids in the home increases the risk for accidental or intentional use or overdose by others. How can I treat pain without opioids? Pain can be managed with many types of alternative treatments. Ask your health care provider to refer you to one or more specialists who can  help you manage pain through:  Physical or occupational therapy.  Counseling (cognitive-behavioral therapy).  Good nutrition.  Biofeedback.  Massage.  Meditation.  Non-opioid medicine.  Following a gentle exercise program. Where can I get support? If you have been taking opioids for a long time, you may benefit from receiving support for quitting from a local support group or counselor. Ask your health care provider for a referral to these resources in your area. When should I seek medical care? Seek medical care right away if you are taking opioids and you experience any of the following:  Difficulty breathing.  Breathing that is more shallow or slower than normal.  A very slow heartbeat (pulse).  Severe confusion.  Unconsciousness.  Sleepiness.  Difficulty waking from sleep.  Slurred speech.  Nausea and vomiting.  Cold, clammy skin.  Blue lips or fingernails.  Limpness.  Abnormally small pupils. If you think that you or someone else may have taken too much of an opioid medicine, get medical help right away. Do not wait to see if the symptoms go away on their own. Call your local emergency services (911 in the [place] or call the hotline of the [facility] 862-077-8365 in the [place].).  Where can I get more information? To learn more about opioid medicines, visit the Centers for Disease Control and Prevention web site Opioid Basics at https://keller-santana.com/. Summary  Opioid medicines can help you manage moderate-to-severe pain for a short period of time.  Taking opioid pain medicine for a long period of time puts you at risk for unintentional accidents, injury, and even death.  If you think that you or someone else may have taken too much of an opioid, get medical help right away. This information is not intended to replace advice given to you by your health care provider. Make sure you discuss any questions you  have with your health care provider. Document Released: 06/16/2015 Document Revised: 01/12/2016 Document Reviewed: 12/30/2014 Elsevier Interactive Patient Education  2017 [employer].

## 2017-12-12 NOTE — Progress Notes (Addendum)
Chief Complaint  Patient presents with  . Follow-up    Recheck on right shoulder, DOS 07-03-17.   History: 47 year old male had a right rotator cuff repair he had some type of brachial plexopathy postop had a lot of pain we finally got that under control and he is taking less and less hydrocodone  He is doing therapy he has some pain in the back of his arm and his triceps.    Review of systems  complains of some clicking and popping in his right index finger which was repaired many years ago, complains of clicking popping and pain over the A1 pulley  Exam   BP (!) 151/94   Pulse 76   Ht 5\' 5"  (1.651 m)   Wt 170 lb (77.1 kg)   BMI 28.29 kg/m  He is awake alert and oriented x3 mood and affect is normal he looks much more comfortable.  His right arm looks good he has normal sensation and good pulse skin looks clean dry and intact including surgical incisions  He has mild weakness in his rotator cuff in abduction and flexion he has normal external rotation without pain.  His right shoulder passive motion abduction 100 degrees flexion 170 degrees active motion 100 degrees abduction 120 degrees flexion.    As far as his finger goes he has a scar over the A1 pulley area of the index and long finger he has clicking and popping of the pain at the PIP joint and tenderness over the A1 pulley  He does not want to see a hand surgeon at this time  Encounter Diagnoses  Name Primary?  . S/P right rotator cuff repair 07/03/17 Yes  . Trigger finger, right index finger     Continue therapy see me in 1 month in August  Addendum  12/03/2017 4 12/03/2017 Hydrocodone-Acetamin 5-325 Mg  28 7 [address] Har [id number] [place] ([id number]) 0 20.00 MME Comm Ins [place] 12/02/2017 5 12/02/2017 Buprenorp-Nalox 8-2 Mg Sl Film  28 14 Ch Kob [id number] [place] ([id number]) 0 16.00 mg Comm Ins [place] 11/27/2017 4 11/27/2017 Hydrocodone-Acetamin 5-325 Mg  28 4 St Har [id number] [place] (0327) 0 35.00 MME Comm Ins [place] 11/25/2017 5 11/25/2017 Buprenorp-Nalox  8-2 Mg Sl Film  14 7 Ch Kob [id number] [place] ([id number]) 0 16.00 mg Comm Ins [place] 11/19/2017 4 11/19/2017 Hydrocodone-Acetamin 5-325 Mg  28 4 St Har [id number] [place] (0327) 0 35.00 MME Comm Ins [place] 11/18/2017 5 11/18/2017 Buprenorp-Nalox 8-2 Mg Sl Film  14 7 Ch Kob [id number] [place] (0197) 0 16.00 mg Comm Ins [place] 11/11/2017 5 11/11/2017 Buprenorp-Nalox 8-2 Mg Sl Film  14 7 Ch Kob [id number] [place] (0197) 0 16.00 mg Comm Ins [place]

## 2017-12-17 DIAGNOSIS — Z6829 Body mass index (BMI) 29.0-29.9, adult: Secondary | ICD-10-CM | POA: Diagnosis not present

## 2017-12-17 DIAGNOSIS — I10 Essential (primary) hypertension: Secondary | ICD-10-CM | POA: Diagnosis not present

## 2017-12-17 DIAGNOSIS — F17200 Nicotine dependence, unspecified, uncomplicated: Secondary | ICD-10-CM | POA: Diagnosis not present

## 2017-12-17 DIAGNOSIS — N529 Male erectile dysfunction, unspecified: Secondary | ICD-10-CM | POA: Diagnosis not present

## 2017-12-17 DIAGNOSIS — R030 Elevated blood-pressure reading, without diagnosis of hypertension: Secondary | ICD-10-CM | POA: Diagnosis not present

## 2017-12-20 DIAGNOSIS — G4733 Obstructive sleep apnea (adult) (pediatric): Secondary | ICD-10-CM | POA: Diagnosis not present

## 2017-12-30 DIAGNOSIS — I10 Essential (primary) hypertension: Secondary | ICD-10-CM | POA: Diagnosis not present

## 2018-01-01 DIAGNOSIS — Z6829 Body mass index (BMI) 29.0-29.9, adult: Secondary | ICD-10-CM | POA: Diagnosis not present

## 2018-01-01 DIAGNOSIS — E291 Testicular hypofunction: Secondary | ICD-10-CM | POA: Diagnosis not present

## 2018-01-01 DIAGNOSIS — I10 Essential (primary) hypertension: Secondary | ICD-10-CM | POA: Diagnosis not present

## 2018-01-06 DIAGNOSIS — I10 Essential (primary) hypertension: Secondary | ICD-10-CM | POA: Diagnosis not present

## 2018-01-16 ENCOUNTER — Ambulatory Visit: Payer: 59 | Admitting: Orthopedic Surgery

## 2018-01-20 DIAGNOSIS — G4733 Obstructive sleep apnea (adult) (pediatric): Secondary | ICD-10-CM | POA: Diagnosis not present

## 2018-01-27 DIAGNOSIS — I10 Essential (primary) hypertension: Secondary | ICD-10-CM | POA: Diagnosis not present

## 2018-02-04 ENCOUNTER — Encounter: Payer: Self-pay | Admitting: Orthopedic Surgery

## 2018-02-04 ENCOUNTER — Ambulatory Visit: Payer: 59 | Admitting: Orthopedic Surgery

## 2018-02-25 DIAGNOSIS — Z6828 Body mass index (BMI) 28.0-28.9, adult: Secondary | ICD-10-CM | POA: Diagnosis not present

## 2018-02-25 DIAGNOSIS — J060 Acute laryngopharyngitis: Secondary | ICD-10-CM | POA: Diagnosis not present

## 2018-02-26 DIAGNOSIS — M76821 Posterior tibial tendinitis, right leg: Secondary | ICD-10-CM | POA: Diagnosis not present

## 2018-02-26 DIAGNOSIS — M722 Plantar fascial fibromatosis: Secondary | ICD-10-CM | POA: Diagnosis not present

## 2018-03-02 ENCOUNTER — Encounter (HOSPITAL_COMMUNITY): Payer: Self-pay | Admitting: Emergency Medicine

## 2018-03-02 ENCOUNTER — Emergency Department (HOSPITAL_COMMUNITY): Payer: 59

## 2018-03-02 ENCOUNTER — Emergency Department (HOSPITAL_COMMUNITY)
Admission: EM | Admit: 2018-03-02 | Discharge: 2018-03-02 | Disposition: A | Discharge status: Home / Self Care | Payer: 59 | Attending: Emergency Medicine | Admitting: Emergency Medicine

## 2018-03-02 ENCOUNTER — Other Ambulatory Visit: Payer: Self-pay

## 2018-03-02 DIAGNOSIS — Z79899 Other long term (current) drug therapy: Secondary | ICD-10-CM | POA: Insufficient documentation

## 2018-03-02 DIAGNOSIS — I10 Essential (primary) hypertension: Secondary | ICD-10-CM | POA: Diagnosis not present

## 2018-03-02 DIAGNOSIS — R05 Cough: Secondary | ICD-10-CM | POA: Diagnosis not present

## 2018-03-02 DIAGNOSIS — F17210 Nicotine dependence, cigarettes, uncomplicated: Secondary | ICD-10-CM | POA: Diagnosis not present

## 2018-03-02 DIAGNOSIS — R69 Illness, unspecified: Secondary | ICD-10-CM

## 2018-03-02 DIAGNOSIS — R079 Chest pain, unspecified: Secondary | ICD-10-CM | POA: Diagnosis not present

## 2018-03-02 DIAGNOSIS — J111 Influenza due to unidentified influenza virus with other respiratory manifestations: Secondary | ICD-10-CM

## 2018-03-02 HISTORY — DX: Essential (primary) hypertension: I10

## 2018-03-02 LAB — CBC WITH DIFFERENTIAL/PLATELET
BASOS ABS: 0 10*3/uL (ref 0.0–0.1)
BASOS PCT: 0 %
EOS PCT: 1 %
Eosinophils Absolute: 0.1 10*3/uL (ref 0.0–0.7)
HEMATOCRIT: 46.9 % (ref 39.0–52.0)
Hemoglobin: 15.7 g/dL (ref 13.0–17.0)
Lymphocytes Relative: 20 %
Lymphs Abs: 2.6 10*3/uL (ref 0.7–4.0)
MCH: 31.4 pg (ref 26.0–34.0)
MCHC: 33.5 g/dL (ref 30.0–36.0)
MCV: 93.8 fL (ref 78.0–100.0)
MONO ABS: 0.8 10*3/uL (ref 0.1–1.0)
Monocytes Relative: 6 %
NEUTROS ABS: 9.5 10*3/uL — AB (ref 1.7–7.7)
Neutrophils Relative %: 73 %
Platelets: 254 10*3/uL (ref 150–400)
RBC: 5 MIL/uL (ref 4.22–5.81)
RDW: 13.8 % (ref 11.5–15.5)
WBC: 13 10*3/uL — AB (ref 4.0–10.5)

## 2018-03-02 LAB — CK: Total CK: 46 U/L — ABNORMAL LOW (ref 49–397)

## 2018-03-02 LAB — BASIC METABOLIC PANEL
ANION GAP: 9 (ref 5–15)
BUN: 17 mg/dL (ref 6–20)
CALCIUM: 8.8 mg/dL — AB (ref 8.9–10.3)
CO2: 23 mmol/L (ref 22–32)
Chloride: 107 mmol/L (ref 98–111)
Creatinine, Ser: 0.85 mg/dL (ref 0.61–1.24)
GFR calc Af Amer: >60 mL/min (ref >60)
Glucose, Bld: 96 mg/dL (ref 70–99)
Potassium: 3.6 mmol/L (ref 3.5–5.1)
SODIUM: 139 mmol/L (ref 135–145)

## 2018-03-02 MED ORDER — SODIUM CHLORIDE 0.9 % IV BOLUS
1000.0000 mL | Freq: Once | INTRAVENOUS | Status: AC
  Administered 2018-03-02: 1000 mL via INTRAVENOUS

## 2018-03-02 MED ORDER — KETOROLAC TROMETHAMINE 30 MG/ML IJ SOLN
15.0000 mg | Freq: Once | INTRAMUSCULAR | Status: AC
  Administered 2018-03-02: 15 mg via INTRAVENOUS
  Filled 2018-03-02: qty 1

## 2018-03-02 NOTE — ED Triage Notes (Signed)
Pt reports cough/congestion for one week. Pt spouse reports getting worse. Productive cough,chills bodyaches

## 2018-03-02 NOTE — ED Provider Notes (Signed)
[facility] EMERGENCY DEPARTMENT Provider Note   CSN: [id number] Arrival date & time: 03/02/18  1154     History   Chief Complaint Chief Complaint  Patient presents with  . Cough    HPI John Jones is a 47 y.o. male.  HPI  47 year old male presents with chief complaint of cough.  Symptoms all started 5 days ago.  Saw his doctor and was diagnosed with "the crud".  Patient states that he has been on Mucinex and DayQuil and NyQuil.  Symptoms seem to be worsening.  He had subjective fever along with headache, ear pain/pressure, cough with sputum and chest congestion.  He is also had bilateral thigh pain and chest pain.  No vomiting but has been having some diarrhea.  Some sore throat.  Past Medical History:  Diagnosis Date  . History of kidney stones    multiple episodes  . Hypertension   . Migraine   . Right ureteral stone   . Sleep apnea    Uses CPAP    Patient Active Problem List   Diagnosis Date Noted  . S/P right rotator cuff repair 07/03/17 07/08/2017  . Bursitis of right shoulder   . Ureteral stone with hydronephrosis 07/25/2016  . Abscess of finger of right hand     Past Surgical History:  Procedure Laterality Date  . CARPAL TUNNEL RELEASE Right 12/08/2014   Procedure: RIGHT CARPAL TUNNEL RELEASE;  Surgeon: Iran Planas, MD;  Location: [place];  Service: Orthopedics;  Laterality: Right;  . CYSTOSCOPY Right 12/2015   stent placed and removed   . CYSTOSCOPY W/ URETERAL STENT PLACEMENT Right 07/25/2016   Procedure: CYSTOSCOPY/RETROGRADE/URETEROSCOPY/STENT PLACEMENT;  Surgeon: Alexis Frock, MD;  Location: [facility];  Service: Urology;  Laterality: Right;  . INCISION AND DRAINAGE Right 01/19/2016   Procedure: INCISION AND DRAINAGE RIGHT LONG FINGER;  Surgeon: Carole Civil, MD;  Location: [facility];  Service: Orthopedics;  Laterality: Right;  RIGHT LONG FINGER - PT COMING AT 9:00 FOR LABWORK  . SHOULDER ARTHROSCOPY WITH OPEN ROTATOR CUFF REPAIR  Right 07/03/2017   Procedure: SHOULDER ARTHROSCOPY;  Surgeon: Carole Civil, MD;  Location: [facility];  Service: Orthopedics;  Laterality: Right;  . SHOULDER OPEN ROTATOR CUFF REPAIR Right 07/03/2017   Procedure: ROTATOR CUFF REPAIR SHOULDER OPEN;  Surgeon: Carole Civil, MD;  Location: [facility];  Service: Orthopedics;  Laterality: Right;  . WRIST ARTHROSCOPY Right 12/08/2014   Procedure: RIGHT WRIST ARTHROSCOPY WITH DEBRIDEMENT/OR REPAIR;  Surgeon: Iran Planas, MD;  Location: [place];  Service: Orthopedics;  Laterality: Right;        Home Medications    Prior to Admission medications   Medication Sig Start Date End Date Taking? Authorizing Provider  Aspirin-Salicylamide-Caffeine (BC HEADACHE POWDER PO) Take 1 packet by mouth daily as needed (for headaches.).   Yes [provider]  clomiPHENE (CLOMID) 50 MG tablet Take 50 mg by mouth daily.  10/01/17 10/01/18 Yes [provider]  lisinopril (PRINIVIL,ZESTRIL) 20 MG tablet Take 20 mg by mouth daily.  02/13/18  Yes [provider]  Multiple Vitamins-Minerals (ADULT GUMMY PO) Take 2 tablets by mouth at bedtime. Multivitamin for Men   Yes [provider]    Family History History reviewed. No pertinent family history.  Social History Social History   Tobacco Use  . Smoking status: Current Every Day Smoker    Packs/day: 0.50    Years: 8.00    Pack years: 4.00    Types: Cigarettes  .  Smokeless tobacco: Never Used  Substance Use Topics  . Alcohol use: Yes    Comment: occ  . Drug use: No     Allergies   Penicillins   Review of Systems Review of Systems  Constitutional: Positive for fever.  HENT: Positive for congestion, ear pain and sore throat.   Respiratory: Positive for cough and shortness of breath.   Cardiovascular: Positive for chest pain.  Gastrointestinal: Positive for diarrhea. Negative for abdominal pain and vomiting.  Musculoskeletal: Positive for myalgias.  Neurological:  Positive for headaches.  All other systems reviewed and are negative.    Physical Exam Updated Vital Signs BP (!) 134/97 (BP Location: Right Arm)   Pulse 86   Temp 98.3 F (36.8 C) (Oral)   Resp 20   SpO2 97%   Physical Exam  Constitutional: He is oriented to person, place, and time. He appears well-developed and well-nourished.  HENT:  Head: Normocephalic and atraumatic.  Right Ear: Tympanic membrane and external ear normal.  Left Ear: Tympanic membrane and external ear normal.  Nose: Nose normal.  Mouth/Throat: Uvula is midline. Mucous membranes are dry. No oropharyngeal exudate.  Eyes: Right eye exhibits no discharge. Left eye exhibits no discharge.  Neck: Normal range of motion. Neck supple.  Cardiovascular: Normal rate, regular rhythm and normal heart sounds.  Pulmonary/Chest: Effort normal and breath sounds normal. He has no wheezes.  Abdominal: Soft. He exhibits no distension. There is no tenderness.  Musculoskeletal: He exhibits no edema.  Minimal tenderness to bilateral anterior thighs. No swelling.  Neurological: He is alert and oriented to person, place, and time.  Skin: Skin is warm and dry.  Nursing note and vitals reviewed.    ED Treatments / Results  Labs (all labs ordered are listed, but only abnormal results are displayed) Labs Reviewed  BASIC METABOLIC PANEL - Abnormal; Notable for the following components:      Result Value   Calcium 8.8 (*)    All other components within normal limits  CBC WITH DIFFERENTIAL/PLATELET - Abnormal; Notable for the following components:   WBC 13.0 (*)    Neutro Abs 9.5 (*)    All other components within normal limits  CK - Abnormal; Notable for the following components:   Total CK 46 (*)    All other components within normal limits    EKG None  Radiology Dg Chest 2 View  Result Date: 03/02/2018 CLINICAL DATA:  Cough and congestion. Patient reports vaping with THC EXAM: CHEST - 2 VIEW COMPARISON:  None.  FINDINGS: Lungs are clear. Heart size and pulmonary vascularity are normal. No adenopathy. No bone lesions. No pneumothorax. IMPRESSION: No edema or consolidation. Electronically Signed   By: Lowella Grip III M.D.   On: 03/02/2018 12:17    Procedures Procedures (including critical care time)  Medications Ordered in ED Medications  sodium chloride 0.9 % bolus 1,000 mL (1,000 mLs Intravenous New Bag/Given 03/02/18 1410)  ketorolac (TORADOL) 30 MG/ML injection 15 mg (15 mg Intravenous Given 03/02/18 1410)     Initial Impression / Assessment and Plan / ED Course  I have reviewed the triage vital signs and the nursing notes.  Pertinent labs & imaging results that were available during my care of the patient were reviewed by me and considered in my medical decision making (see chart for details).     Symptoms are c/w viral illness/flu-like illness. While he may have the flu, he has had symptoms for about 5 days and thus Tamiflu would not  be indicated.  He also does not appear severely ill.  Mild white blood cell count elevation probably just from his infection but I highly doubt acute serious bacterial illness.  Pneumonia is unlikely.  While he does have a headache, I have very low suspicion of an acute CNS emergency.  He is feeling a little better after treatment and at this point appears stable for discharge home to follow-up with his PCP.  Return precautions.  Final Clinical Impressions(s) / ED Diagnoses   Final diagnoses:  Influenza-like illness    ED Discharge Orders    None       Sherwood Gambler, MD 03/02/18 [phone]

## 2018-03-02 NOTE — Discharge Instructions (Addendum)
If you develop high fever, vomiting, trouble breathing, severe headache or neck stiffness, or any other new/concerning symptoms then return to the ER for evaluation

## 2018-03-16 DIAGNOSIS — E291 Testicular hypofunction: Secondary | ICD-10-CM | POA: Diagnosis not present

## 2018-03-16 DIAGNOSIS — R869 Unspecified abnormal finding in specimens from male genital organs: Secondary | ICD-10-CM | POA: Diagnosis not present

## 2018-03-20 DIAGNOSIS — E291 Testicular hypofunction: Secondary | ICD-10-CM | POA: Diagnosis not present

## 2018-03-23 DIAGNOSIS — R5383 Other fatigue: Secondary | ICD-10-CM | POA: Diagnosis not present

## 2018-03-23 DIAGNOSIS — R6882 Decreased libido: Secondary | ICD-10-CM | POA: Diagnosis not present

## 2018-04-27 DIAGNOSIS — L821 Other seborrheic keratosis: Secondary | ICD-10-CM | POA: Diagnosis not present

## 2018-04-27 DIAGNOSIS — Z1283 Encounter for screening for malignant neoplasm of skin: Secondary | ICD-10-CM | POA: Diagnosis not present

## 2018-04-27 DIAGNOSIS — L820 Inflamed seborrheic keratosis: Secondary | ICD-10-CM | POA: Diagnosis not present

## 2018-06-05 ENCOUNTER — Encounter (HOSPITAL_COMMUNITY): Payer: Self-pay | Admitting: Emergency Medicine

## 2018-06-05 ENCOUNTER — Emergency Department (HOSPITAL_COMMUNITY): Payer: 59

## 2018-06-05 ENCOUNTER — Emergency Department (HOSPITAL_COMMUNITY)
Admission: EM | Admit: 2018-06-05 | Discharge: 2018-06-05 | Disposition: A | Discharge status: Home / Self Care | Payer: 59 | Attending: Emergency Medicine | Admitting: Emergency Medicine

## 2018-06-05 ENCOUNTER — Other Ambulatory Visit: Payer: Self-pay

## 2018-06-05 DIAGNOSIS — R1084 Generalized abdominal pain: Secondary | ICD-10-CM | POA: Insufficient documentation

## 2018-06-05 DIAGNOSIS — I10 Essential (primary) hypertension: Secondary | ICD-10-CM | POA: Diagnosis not present

## 2018-06-05 DIAGNOSIS — F17210 Nicotine dependence, cigarettes, uncomplicated: Secondary | ICD-10-CM | POA: Diagnosis not present

## 2018-06-05 DIAGNOSIS — R109 Unspecified abdominal pain: Secondary | ICD-10-CM | POA: Diagnosis not present

## 2018-06-05 DIAGNOSIS — N200 Calculus of kidney: Secondary | ICD-10-CM | POA: Diagnosis not present

## 2018-06-05 LAB — BASIC METABOLIC PANEL
Anion gap: 8 (ref 5–15)
BUN: 19 mg/dL (ref 6–20)
CALCIUM: 8.2 mg/dL — AB (ref 8.9–10.3)
CO2: 22 mmol/L (ref 22–32)
CREATININE: 0.99 mg/dL (ref 0.61–1.24)
Chloride: 110 mmol/L (ref 98–111)
GFR calc non Af Amer: >60 mL/min (ref >60)
Glucose, Bld: 89 mg/dL (ref 70–99)
Potassium: 3.6 mmol/L (ref 3.5–5.1)
Sodium: 140 mmol/L (ref 135–145)

## 2018-06-05 LAB — URINALYSIS, ROUTINE W REFLEX MICROSCOPIC
Bilirubin Urine: NEGATIVE
Glucose, UA: NEGATIVE mg/dL
HGB URINE DIPSTICK: NEGATIVE
KETONES UR: NEGATIVE mg/dL
Leukocytes, UA: NEGATIVE
NITRITE: NEGATIVE
PH: 6 (ref 5.0–8.0)
Protein, ur: NEGATIVE mg/dL
SPECIFIC GRAVITY, URINE: 1.027 (ref 1.005–1.030)

## 2018-06-05 LAB — CBC WITH DIFFERENTIAL/PLATELET
Abs Immature Granulocytes: 0.02 10*3/uL (ref 0.00–0.07)
BASOS ABS: 0.1 10*3/uL (ref 0.0–0.1)
Basophils Relative: 1 %
EOS ABS: 0.2 10*3/uL (ref 0.0–0.5)
EOS PCT: 3 %
HEMATOCRIT: 46.5 % (ref 39.0–52.0)
Hemoglobin: 14.8 g/dL (ref 13.0–17.0)
Immature Granulocytes: 0 %
LYMPHS ABS: 2.6 10*3/uL (ref 0.7–4.0)
Lymphocytes Relative: 32 %
MCH: 30.1 pg (ref 26.0–34.0)
MCHC: 31.8 g/dL (ref 30.0–36.0)
MCV: 94.7 fL (ref 80.0–100.0)
Monocytes Absolute: 0.7 10*3/uL (ref 0.1–1.0)
Monocytes Relative: 9 %
NRBC: 0 % (ref 0.0–0.2)
Neutro Abs: 4.4 10*3/uL (ref 1.7–7.7)
Neutrophils Relative %: 55 %
Platelets: 202 10*3/uL (ref 150–400)
RBC: 4.91 MIL/uL (ref 4.22–5.81)
RDW: 13 % (ref 11.5–15.5)
WBC: 8 10*3/uL (ref 4.0–10.5)

## 2018-06-05 MED ORDER — KETOROLAC TROMETHAMINE 10 MG PO TABS: 10.0000 mg | ORAL_TABLET | Freq: Four times a day (QID) | ORAL | 0 refills | Status: DC | PRN

## 2018-06-05 MED ORDER — ONDANSETRON HCL 4 MG/2ML IJ SOLN
4.0000 mg | Freq: Once | INTRAMUSCULAR | Status: AC
  Administered 2018-06-05: 4 mg via INTRAVENOUS
  Filled 2018-06-05: qty 2

## 2018-06-05 MED ORDER — KETOROLAC TROMETHAMINE 30 MG/ML IJ SOLN
30.0000 mg | Freq: Once | INTRAMUSCULAR | Status: AC
  Administered 2018-06-05: 30 mg via INTRAVENOUS
  Filled 2018-06-05: qty 1

## 2018-06-05 NOTE — ED Triage Notes (Signed)
Pt reports bilateral flank pain pain since this am. Pt reports history of same in past.

## 2018-06-05 NOTE — Discharge Instructions (Signed)

## 2018-06-05 NOTE — ED Notes (Signed)
Patient transported to CT 

## 2018-06-05 NOTE — ED Provider Notes (Addendum)
Emergency Department Provider Note   I have reviewed the triage vital signs and the nursing notes.   HISTORY  Chief Complaint Flank Pain   HPI John Jones is a 48 y.o. male with PMH of HTN, migraine HA, and recurrent kidney stones presents to the emergency department for evaluation of left flank pain.  Symptoms began today.  Patient has severe, intermittent pain radiating through the left flank.  Patient states this feels similar to prior kidney stones.  He denies any recorded fevers or chills but states he did feel warm today.  He has required urology intervention with kidney stones in the past.  No anterior abdominal pain.  No vomiting or diarrhea.  No other modifying factors.  Past Medical History:  Diagnosis Date  . History of kidney stones    multiple episodes  . Hypertension   . Migraine   . Right ureteral stone   . Sleep apnea    Uses CPAP    Patient Active Problem List   Diagnosis Date Noted  . S/P right rotator cuff repair 07/03/17 07/08/2017  . Bursitis of right shoulder   . Ureteral stone with hydronephrosis 07/25/2016  . Abscess of Jones of right hand     Past Surgical History:  Procedure Laterality Date  . CARPAL TUNNEL RELEASE Right 12/08/2014   Procedure: RIGHT CARPAL TUNNEL RELEASE;  Surgeon: Iran Planas, MD;  Location: [place];  Service: Orthopedics;  Laterality: Right;  . CYSTOSCOPY Right 12/2015   stent placed and removed   . CYSTOSCOPY W/ URETERAL STENT PLACEMENT Right 07/25/2016   Procedure: CYSTOSCOPY/RETROGRADE/URETEROSCOPY/STENT PLACEMENT;  Surgeon: Alexis Frock, MD;  Location: [facility];  Service: Urology;  Laterality: Right;  . INCISION AND DRAINAGE Right 01/19/2016   Procedure: INCISION AND DRAINAGE RIGHT John Jones;  Surgeon: Carole Civil, MD;  Location: [facility];  Service: Orthopedics;  Laterality: Right;  RIGHT [place] Jones - PT COMING AT 9:00 FOR LABWORK  . SHOULDER ARTHROSCOPY WITH OPEN ROTATOR CUFF REPAIR Right  07/03/2017   Procedure: SHOULDER ARTHROSCOPY;  Surgeon: Carole Civil, MD;  Location: [facility];  Service: Orthopedics;  Laterality: Right;  . SHOULDER OPEN ROTATOR CUFF REPAIR Right 07/03/2017   Procedure: ROTATOR CUFF REPAIR SHOULDER OPEN;  Surgeon: Carole Civil, MD;  Location: [facility];  Service: Orthopedics;  Laterality: Right;  . WRIST ARTHROSCOPY Right 12/08/2014   Procedure: RIGHT WRIST ARTHROSCOPY WITH DEBRIDEMENT/OR REPAIR;  Surgeon: Iran Planas, MD;  Location: [place];  Service: Orthopedics;  Laterality: Right;   Allergies Penicillins  History reviewed. No pertinent family history.  Social History Social History   Tobacco Use  . Smoking status: Current Every Day Smoker    Packs/day: 0.50    Years: 8.00    Pack years: 4.00    Types: Cigarettes  . Smokeless tobacco: Never Used  Substance Use Topics  . Alcohol use: Yes    Comment: occ  . Drug use: No    Review of Systems  Constitutional: No fever/chills Cardiovascular: Denies chest pain. Respiratory: Denies shortness of breath. Gastrointestinal: Positive left flank/abdominal pain.  No nausea, no vomiting.  No diarrhea.  No constipation. Genitourinary: Negative for dysuria. Musculoskeletal: Negative for back pain. Skin: Negative for rash. Neurological: Negative for headaches, focal weakness or numbness.  10-point ROS otherwise negative.  ____________________________________________   PHYSICAL EXAM:  VITAL SIGNS: ED Triage Vitals  Enc Vitals Group     BP 06/05/18 1740 (!) 155/101     Pulse Rate 06/05/18 1740 89  Resp 06/05/18 1740 18     Temp 06/05/18 1740 (!) 86 F (30 C)     Temp Source 06/05/18 1740 Oral     SpO2 06/05/18 1740 98 %     Weight 06/05/18 1739 165 lb (74.8 kg)     Height 06/05/18 1739 5\' 4"  (1.626 m)     Pain Score 06/05/18 1740 10   Constitutional: Alert and oriented. Well appearing and in no acute distress. Eyes: Conjunctivae are normal.  Head: Atraumatic. Nose: No  congestion/rhinnorhea. Mouth/Throat: Mucous membranes are moist.   Neck: No stridor.  Cardiovascular: Normal rate, regular rhythm. Good peripheral circulation. Grossly normal heart sounds.   Respiratory: Normal respiratory effort.  No retractions. Lungs CTAB. Gastrointestinal: Soft and nontender. No distention. Mild left CVA tenderness.  Musculoskeletal: No lower extremity tenderness nor edema. No gross deformities of extremities. Neurologic:  Normal speech and language. No gross focal neurologic deficits are appreciated.  Skin:  Skin is warm, dry and intact. No rash noted.  ____________________________________________   LABS (all labs ordered are listed, but only abnormal results are displayed)  Labs Reviewed  BASIC METABOLIC PANEL - Abnormal; Notable for the following components:      Result Value   Calcium 8.2 (*)    All other components within normal limits  URINE CULTURE  CBC WITH DIFFERENTIAL/PLATELET  URINALYSIS, ROUTINE W REFLEX MICROSCOPIC   ____________________________________________  RADIOLOGY  Ct Renal Stone Study  Result Date: 06/05/2018 CLINICAL DATA:  Bilateral flank pain starting this morning. EXAM: CT ABDOMEN AND PELVIS WITHOUT CONTRAST TECHNIQUE: Multidetector CT imaging of the abdomen and pelvis was performed following the standard protocol without IV contrast. COMPARISON:  07/22/2016 FINDINGS: Lower chest: Unremarkable Hepatobiliary: Stable 0.9 by 0.8 cm hypodense lesion in segment 2 of the liver on image 9/2. Contracted gallbladder. Pancreas: Unremarkable Spleen: Unremarkable Adrenals/Urinary Tract: Both adrenal glands appear normal. 6 mm nonobstructive left kidney upper pole calculus on image 54/5. 2 mm left mid kidney nonobstructive calculus, image 56/5. No other stones identified. Slightly accentuated density in the urinary bladder, for example measuring 25 Hounsfield units were is fluid in the gallbladder has a density of about 3 Hounsfield units. No  hydronephrosis or hydroureter. Stomach/Bowel: Unremarkable.  Normal appendix. Vascular/Lymphatic: Aortoiliac atherosclerotic vascular disease. Reproductive: Dense calcification in the right prostate gland similar to prior. Prostate size is within normal limits. Other: No supplemental non-categorized findings. Musculoskeletal: Disc bulge at L5-S1. Small supraumbilical hernia contains adipose tissue. IMPRESSION: 1. There are 2 nonobstructive left renal calculi. 2. Somewhat high density in the urinary bladder, cause uncertain. I do not see that the patient has had a recent contrast administration in the last week. Conceivably this could be from proteinaceous content or hematuria. 3. Stable likely benign hypodense lesion in segment 2 of the liver. 4.  Aortic Atherosclerosis (ICD10-I70.0). 5. Disc bulge at L5-S1, without impingement. 6. Small supraumbilical hernia contains adipose tissue. Electronically Signed   By: Van Clines M.D.   On: 06/05/2018 18:46    ____________________________________________   PROCEDURES  Procedure(s) performed:   Procedures  None ____________________________________________   INITIAL IMPRESSION / ASSESSMENT AND PLAN / ED COURSE  Pertinent labs & imaging results that were available during my care of the patient were reviewed by me and considered in my medical decision making (see chart for details).  Patient presents to the emergency department for evaluation of left flank pain similar to prior kidney stones.  He has required urology intervention for prior stones including his past 2 stones.  Plan  for CT renal in this clinical setting along with blood work and urinalysis with culture.  Will give Toradol and Zofran for symptom management. Recorded as hypothermic from triage. This was repeated by me in the exam room and was normal. Suspect data entry error.   06:45 PM Lab work reviewed.  No urinary tract infection, leukocytosis, or acute kidney injury.  CT renal  shows no obstructing ureteral stone.  Heterogeneous material in the bladder.  Question recently passed kidney stone.  Without obstruction or infection feel the patient is able for discharge with outpatient PCP and neurology follow-up.  I did provide Toradol to take as needed for pain. Discussed ED return precautions.  ____________________________________________  FINAL CLINICAL IMPRESSION(S) / ED DIAGNOSES  Final diagnoses:  Flank pain     MEDICATIONS GIVEN DURING THIS VISIT:  Medications  ketorolac (TORADOL) 30 MG/ML injection 30 mg (30 mg Intravenous Given 06/05/18 1803)  ondansetron (ZOFRAN) injection 4 mg (4 mg Intravenous Given 06/05/18 1803)     NEW OUTPATIENT MEDICATIONS STARTED DURING THIS VISIT:  New Prescriptions   KETOROLAC (TORADOL) 10 MG TABLET    Take 1 tablet (10 mg total) by mouth every 6 (six) hours as needed for moderate pain.    Note:  This document was prepared using Dragon voice recognition software and may include unintentional dictation errors.  Nanda Quinton, MD Emergency Medicine    Anthoni Geerts, [facility], MD 06/05/18 Sheralyn Boatman, MD 06/05/18 Einar Crow

## 2018-06-07 LAB — URINE CULTURE
Culture: <10000 — AB
SPECIAL REQUESTS: NORMAL

## 2018-06-10 DIAGNOSIS — R509 Fever, unspecified: Secondary | ICD-10-CM | POA: Diagnosis not present

## 2018-06-10 DIAGNOSIS — N200 Calculus of kidney: Secondary | ICD-10-CM | POA: Diagnosis not present

## 2018-06-10 DIAGNOSIS — R1032 Left lower quadrant pain: Secondary | ICD-10-CM | POA: Diagnosis not present

## 2018-06-10 DIAGNOSIS — N3289 Other specified disorders of bladder: Secondary | ICD-10-CM | POA: Diagnosis not present

## 2018-06-22 DIAGNOSIS — M545 Low back pain: Secondary | ICD-10-CM | POA: Diagnosis not present

## 2018-06-22 DIAGNOSIS — I10 Essential (primary) hypertension: Secondary | ICD-10-CM | POA: Diagnosis not present

## 2018-06-26 DIAGNOSIS — M722 Plantar fascial fibromatosis: Secondary | ICD-10-CM | POA: Diagnosis not present

## 2018-07-24 DIAGNOSIS — I10 Essential (primary) hypertension: Secondary | ICD-10-CM | POA: Diagnosis not present

## 2018-08-04 ENCOUNTER — Emergency Department (HOSPITAL_COMMUNITY)
Admission: EM | Admit: 2018-08-04 | Discharge: 2018-08-04 | Disposition: A | Discharge status: Home / Self Care | Payer: 59 | Attending: Emergency Medicine | Admitting: Emergency Medicine

## 2018-08-04 ENCOUNTER — Encounter (HOSPITAL_COMMUNITY): Payer: Self-pay

## 2018-08-04 ENCOUNTER — Other Ambulatory Visit: Payer: Self-pay

## 2018-08-04 DIAGNOSIS — I10 Essential (primary) hypertension: Secondary | ICD-10-CM | POA: Insufficient documentation

## 2018-08-04 DIAGNOSIS — R109 Unspecified abdominal pain: Secondary | ICD-10-CM

## 2018-08-04 DIAGNOSIS — F17210 Nicotine dependence, cigarettes, uncomplicated: Secondary | ICD-10-CM | POA: Insufficient documentation

## 2018-08-04 LAB — BASIC METABOLIC PANEL
Anion gap: 8 (ref 5–15)
BUN: 16 mg/dL (ref 6–20)
CHLORIDE: 106 mmol/L (ref 98–111)
CO2: 24 mmol/L (ref 22–32)
CREATININE: 0.97 mg/dL (ref 0.61–1.24)
Calcium: 9.2 mg/dL (ref 8.9–10.3)
GFR calc non Af Amer: >60 mL/min (ref >60)
Glucose, Bld: 79 mg/dL (ref 70–99)
Potassium: 4.1 mmol/L (ref 3.5–5.1)
Sodium: 138 mmol/L (ref 135–145)

## 2018-08-04 LAB — URINALYSIS, ROUTINE W REFLEX MICROSCOPIC
Glucose, UA: NEGATIVE mg/dL
Ketones, ur: NEGATIVE mg/dL
LEUKOCYTE UA: NEGATIVE
Nitrite: NEGATIVE
PH: 5 (ref 5.0–8.0)
Protein, ur: NEGATIVE mg/dL
SPECIFIC GRAVITY, URINE: 1.028 (ref 1.005–1.030)

## 2018-08-04 LAB — CBC
HEMATOCRIT: 48.6 % (ref 39.0–52.0)
HEMOGLOBIN: 15.4 g/dL (ref 13.0–17.0)
MCH: 30.1 pg (ref 26.0–34.0)
MCHC: 31.7 g/dL (ref 30.0–36.0)
MCV: 95.1 fL (ref 80.0–100.0)
Platelets: 218 10*3/uL (ref 150–400)
RBC: 5.11 MIL/uL (ref 4.22–5.81)
RDW: 13.2 % (ref 11.5–15.5)
WBC: 7.5 10*3/uL (ref 4.0–10.5)
nRBC: 0 % (ref 0.0–0.2)

## 2018-08-04 MED ORDER — KETOROLAC TROMETHAMINE 30 MG/ML IJ SOLN
30.0000 mg | Freq: Once | INTRAMUSCULAR | Status: DC
  Filled 2018-08-04: qty 1

## 2018-08-04 MED ORDER — TAMSULOSIN HCL 0.4 MG PO CAPS: 0.4000 mg | ORAL_CAPSULE | Freq: Every day | ORAL | 0 refills | Status: AC

## 2018-08-04 MED ORDER — OXYCODONE-ACETAMINOPHEN 5-325 MG PO TABS: 1.0000 | ORAL_TABLET | Freq: Four times a day (QID) | ORAL | 0 refills | Status: DC | PRN

## 2018-08-04 MED ORDER — MORPHINE SULFATE (PF) 4 MG/ML IV SOLN
6.0000 mg | Freq: Once | INTRAVENOUS | Status: AC
  Administered 2018-08-04: 6 mg via INTRAMUSCULAR
  Filled 2018-08-04: qty 2

## 2018-08-04 MED ORDER — KETOROLAC TROMETHAMINE 10 MG PO TABS: 10.0000 mg | ORAL_TABLET | Freq: Four times a day (QID) | ORAL | 0 refills | Status: DC | PRN

## 2018-08-04 MED ORDER — OXYCODONE-ACETAMINOPHEN 5-325 MG PO TABS
1.0000 | ORAL_TABLET | ORAL | Status: DC | PRN
  Administered 2018-08-04: 1 via ORAL
  Filled 2018-08-04 (×2): qty 1

## 2018-08-04 NOTE — Discharge Instructions (Signed)
You have been seen in the Emergency Department (ED) today for pain that we believe based on your workup, is caused by kidney stones.  As we have discussed, please drink plenty of fluids.  Please make a follow up appointment with the physician(s) listed elsewhere in this documentation. ° °You may take pain medication as needed but ONLY as prescribed.  Please also take your prescribed Flomax daily.  We also recommend that you take over-the-counter ibuprofen regularly according to label instructions over the next 5 days.  Take it with meals to minimize stomach discomfort. ° °Please see your doctor as soon as possible as stones may take 1-3 weeks to pass and you may require additional care or medications. ° °Do not drink alcohol, drive or participate in any other potentially dangerous activities while taking opiate pain medication as it may make you sleepy. Do not take this medication with any other sedating medications, either prescription or over-the-counter. If you were prescribed Percocet or Vicodin, do not take these with acetaminophen (Tylenol) as it is already contained within these medications. °  °Take Percocet as needed for severe pain.  This medication is an opiate (or narcotic) pain medication and can be habit forming.  Use it as little as possible to achieve adequate pain control.  Do not use or use it with extreme caution if you have a history of opiate abuse or dependence.  If you are on a pain contract with your primary care doctor or a pain specialist, be sure to let them know you were prescribed this medication today from the Emergency Department.  This medication is intended for your use only - do not give any to anyone else and keep it in a secure place where nobody else, especially children, have access to it.  It will also cause or worsen constipation, so you may want to consider taking an over-the-counter stool softener while you are taking this medication. ° °Return to the Emergency Department  (ED) or call your doctor if you have any worsening pain, fever, painful urination, are unable to urinate, or develop other symptoms that concern you. ° ° ° °Kidney Stones °Kidney stones (urolithiasis) are deposits that form inside your kidneys. The intense pain is caused by the stone moving through the urinary tract. When the stone moves, the ureter goes into spasm around the stone. The stone is usually passed in the urine.  °CAUSES  °A disorder that makes certain neck glands produce too much parathyroid hormone (primary hyperparathyroidism). °A buildup of uric acid crystals, similar to gout in your joints. °Narrowing (stricture) of the ureter. °A kidney obstruction present at birth (congenital obstruction). °Previous surgery on the kidney or ureters. °Numerous kidney infections. °SYMPTOMS  °Feeling sick to your stomach (nauseous). °Throwing up (vomiting). °Blood in the urine (hematuria). °Pain that usually spreads (radiates) to the groin. °Frequency or urgency of urination. °DIAGNOSIS  °Taking a history and physical exam. °Blood or urine tests. °CT scan. °Occasionally, an examination of the inside of the urinary bladder (cystoscopy) is performed. °TREATMENT  °Observation. °Increasing your fluid intake. °Extracorporeal shock wave lithotripsy--This is a noninvasive procedure that uses shock waves to break up kidney stones. °Surgery may be needed if you have severe pain or persistent obstruction. There are various surgical procedures. Most of the procedures are performed with the use of small instruments. Only small incisions are needed to accommodate these instruments, so recovery time is minimized. °The size, location, and chemical composition are all important variables that will determine the proper   choice of action for you. Talk to your health care provider to better understand your situation so that you will minimize the risk of injury to yourself and your kidney.  °HOME CARE INSTRUCTIONS  °Drink enough water  and fluids to keep your urine clear or pale yellow. This will help you to pass the stone or stone fragments. °Strain all urine through the provided strainer. Keep all particulate matter and stones for your health care provider to see. The stone causing the pain may be as small as a grain of salt. It is very important to use the strainer each and every time you pass your urine. The collection of your stone will allow your health care provider to analyze it and verify that a stone has actually passed. The stone analysis will often identify what you can do to reduce the incidence of recurrences. °Only take over-the-counter or prescription medicines for pain, discomfort, or fever as directed by your health care provider. °Keep all follow-up visits as told by your health care provider. This is important. °Get follow-up X-rays if required. The absence of pain does not always mean that the stone has passed. It may have only stopped moving. If the urine remains completely obstructed, it can cause loss of kidney function or even complete destruction of the kidney. It is your responsibility to make sure X-rays and follow-ups are completed. Ultrasounds of the kidney can show blockages and the status of the kidney. Ultrasounds are not associated with any radiation and can be performed easily in a matter of minutes. °Make changes to your daily diet as told by your health care provider. You may be told to: °Limit the amount of salt that you eat. °Eat 5 or more servings of fruits and vegetables each day. °Limit the amount of meat, poultry, fish, and eggs that you eat. °Collect a 24-hour urine sample as told by your health care provider. You may need to collect another urine sample every 6-12 months. °SEEK MEDICAL CARE IF: °You experience pain that is progressive and unresponsive to any pain medicine you have been prescribed. °SEEK IMMEDIATE MEDICAL CARE IF:  °Pain cannot be controlled with the prescribed medicine. °You have a  fever or shaking chills. °The severity or intensity of pain increases over 18 hours and is not relieved by pain medicine. °You develop a new onset of abdominal pain. °You feel faint or pass out. °You are unable to urinate. °  °This information is not intended to replace advice given to you by your health care provider. Make sure you discuss any questions you have with your health care provider. °  °Document Released: 05/20/2005 Document Revised: 02/08/2015 Document Reviewed: 10/21/2012 °Elsevier Interactive Patient Education ©2016 [employer] ° ° °

## 2018-08-04 NOTE — ED Triage Notes (Signed)
Pt reports back pain and hematuria X2 days. Hx of same and kidney stones. Afebrile in triage.

## 2018-08-04 NOTE — ED Provider Notes (Signed)
Emergency Department Provider Note   I have reviewed the triage vital signs and the nursing notes.   HISTORY  Chief Complaint Flank Pain   HPI John Jones is a 48 y.o. male with past medical history of multiple kidney stones presents to the emergency department with right flank pain.  Patient has had several days of discomfort and noticed blood in the urine today.  No fevers or chills.  Patient has had difficulty controlling his pain symptoms.  He recently passed a kidney stone and followed with his urologist.  He feels that a second stone is passing.  He states this pain feels very similar to his prior stones and is not new or different.  He has tried over-the-counter pain medications and has resumed taking his Flomax with no relief.  Pain radiates around the right flank and slightly anterior.   Past Medical History:  Diagnosis Date  . History of kidney stones    multiple episodes  . Hypertension   . Migraine   . Right ureteral stone   . Sleep apnea    Uses CPAP    Patient Active Problem List   Diagnosis Date Noted  . S/P right rotator cuff repair 07/03/17 07/08/2017  . Bursitis of right shoulder   . Ureteral stone with hydronephrosis 07/25/2016  . Abscess of finger of right hand     Past Surgical History:  Procedure Laterality Date  . CARPAL TUNNEL RELEASE Right 12/08/2014   Procedure: RIGHT CARPAL TUNNEL RELEASE;  Surgeon: Iran Planas, MD;  Location: [facility];  Service: Orthopedics;  Laterality: Right;  . CYSTOSCOPY Right 12/2015   stent placed and removed   . CYSTOSCOPY W/ URETERAL STENT PLACEMENT Right 07/25/2016   Procedure: CYSTOSCOPY/RETROGRADE/URETEROSCOPY/STENT PLACEMENT;  Surgeon: Alexis Frock, MD;  Location: [facility];  Service: Urology;  Laterality: Right;  . INCISION AND DRAINAGE Right 01/19/2016   Procedure: INCISION AND DRAINAGE RIGHT LONG FINGER;  Surgeon: Carole Civil, MD;  Location: [facility];  Service: Orthopedics;   Laterality: Right;  RIGHT LONG FINGER - PT COMING AT 9:00 FOR LABWORK  . SHOULDER ARTHROSCOPY WITH OPEN ROTATOR CUFF REPAIR Right 07/03/2017   Procedure: SHOULDER ARTHROSCOPY;  Surgeon: Carole Civil, MD;  Location: [facility];  Service: Orthopedics;  Laterality: Right;  . SHOULDER OPEN ROTATOR CUFF REPAIR Right 07/03/2017   Procedure: ROTATOR CUFF REPAIR SHOULDER OPEN;  Surgeon: Carole Civil, MD;  Location: [facility];  Service: Orthopedics;  Laterality: Right;  . WRIST ARTHROSCOPY Right 12/08/2014   Procedure: RIGHT WRIST ARTHROSCOPY WITH DEBRIDEMENT/OR REPAIR;  Surgeon: Iran Planas, MD;  Location: [facility];  Service: Orthopedics;  Laterality: Right;    Allergies Penicillins  History reviewed. No pertinent family history.  Social History Social History   Tobacco Use  . Smoking status: Current Every Day Smoker    Packs/day: 0.50    Years: 8.00    Pack years: 4.00    Types: Cigarettes  . Smokeless tobacco: Never Used  Substance Use Topics  . Alcohol use: Yes    Comment: occ  . Drug use: No    Review of Systems  Constitutional: No fever/chills Eyes: No visual changes. ENT: No sore throat. Cardiovascular: Denies chest pain. Respiratory: Denies shortness of breath. Gastrointestinal: No abdominal pain.  No nausea, no vomiting.  No diarrhea.  No constipation. Genitourinary: Negative for dysuria. Positive right flank pain and hematuria.  Musculoskeletal: Negative for back pain. Skin: Negative for rash. Neurological: Negative for headaches, focal weakness or numbness.  10-point ROS otherwise negative.  ____________________________________________   PHYSICAL EXAM:  VITAL SIGNS: ED Triage Vitals  Enc Vitals Group     BP 08/04/18 1428 (!) 149/106     Pulse Rate 08/04/18 1428 94     Resp 08/04/18 1428 20     Temp 08/04/18 1428 (!) 97.4 F (36.3 C)     Temp Source 08/04/18 1428 Oral     SpO2 08/04/18 1428 100 %     Pain Score 08/04/18 1429 10   Constitutional: Alert  and oriented. Well appearing and in no acute distress. Eyes: Conjunctivae are normal.  Head: Atraumatic. Nose: No congestion/rhinnorhea. Mouth/Throat: Mucous membranes are moist.  Neck: No stridor.   Cardiovascular: Normal rate, regular rhythm. Good peripheral circulation. Grossly normal heart sounds.   Respiratory: Normal respiratory effort.  No retractions. Lungs CTAB. Gastrointestinal: Soft and nontender. No distention.  Musculoskeletal: No lower extremity tenderness nor edema. No gross deformities of extremities. Neurologic:  Normal speech and language. No gross focal neurologic deficits are appreciated.  Skin:  Skin is warm, dry and intact. No rash noted.  ____________________________________________   LABS (all labs ordered are listed, but only abnormal results are displayed)  Labs Reviewed  URINALYSIS, ROUTINE W REFLEX MICROSCOPIC - Abnormal; Notable for the following components:      Result Value   APPearance HAZY (*)    Hgb urine dipstick LARGE (*)    Bilirubin Urine SMALL (*)    RBC / HPF >50 (*)    Bacteria, UA FEW (*)    All other components within normal limits  BASIC METABOLIC PANEL  CBC   ____________________________________________  RADIOLOGY  None ____________________________________________   PROCEDURES  Procedure(s) performed:   Procedures  None ____________________________________________   INITIAL IMPRESSION / ASSESSMENT AND PLAN / ED COURSE  Pertinent labs & imaging results that were available during my care of the patient were reviewed by me and considered in my medical decision making (see chart for details).  Patient with history of recurrent kidney stones presents with pain typical of his kidney stone pain.  He has no evidence of infection on UA and is clinically well-appearing.  No concern for developing sepsis.  With multiple stones in the past, discussed emergent imaging versus empiric treatment and urology follow-up.  In conjunction  with the patient, the decision was made to defer imaging for now and treat empirically.  Patient is feeling better after intramuscular morphine and feels ready for home.  I provided a pain prescription along with refill of Flomax.  Patient will call his urologist tomorrow for follow-up.  Discussed emergency department return precautions in detail.   ____________________________________________  FINAL CLINICAL IMPRESSION(S) / ED DIAGNOSES  Final diagnoses:  Right flank pain     MEDICATIONS GIVEN DURING THIS VISIT:  Medications  morphine 4 MG/ML injection 6 mg (6 mg Intramuscular Given 08/04/18 1745)     NEW OUTPATIENT MEDICATIONS STARTED DURING THIS VISIT:  Discharge Medication List as of 08/04/2018  6:47 PM    START taking these medications   Details  oxyCODONE-acetaminophen (PERCOCET/ROXICET) 5-325 MG tablet Take 1 tablet by mouth every 6 (six) hours as needed for severe pain., Starting Tue 08/04/2018, Normal        Note:  This document was prepared using Dragon voice recognition software and may include unintentional dictation errors.  Nanda Quinton, MD Emergency Medicine    Long, Wonda Olds, MD 08/05/18 1021

## 2018-08-06 DIAGNOSIS — F17200 Nicotine dependence, unspecified, uncomplicated: Secondary | ICD-10-CM | POA: Insufficient documentation

## 2018-08-06 DIAGNOSIS — N202 Calculus of kidney with calculus of ureter: Secondary | ICD-10-CM | POA: Diagnosis not present

## 2018-08-06 DIAGNOSIS — N200 Calculus of kidney: Secondary | ICD-10-CM | POA: Diagnosis not present

## 2018-08-11 ENCOUNTER — Emergency Department (HOSPITAL_COMMUNITY)
Admission: EM | Admit: 2018-08-11 | Discharge: 2018-08-11 | Disposition: A | Discharge status: Home / Self Care | Payer: 59 | Attending: Emergency Medicine | Admitting: Emergency Medicine

## 2018-08-11 ENCOUNTER — Other Ambulatory Visit: Payer: Self-pay

## 2018-08-11 ENCOUNTER — Emergency Department (HOSPITAL_COMMUNITY): Payer: 59

## 2018-08-11 ENCOUNTER — Encounter (HOSPITAL_COMMUNITY): Payer: Self-pay | Admitting: Emergency Medicine

## 2018-08-11 DIAGNOSIS — R4182 Altered mental status, unspecified: Secondary | ICD-10-CM | POA: Diagnosis present

## 2018-08-11 DIAGNOSIS — R404 Transient alteration of awareness: Secondary | ICD-10-CM | POA: Diagnosis not present

## 2018-08-11 DIAGNOSIS — I10 Essential (primary) hypertension: Secondary | ICD-10-CM | POA: Insufficient documentation

## 2018-08-11 DIAGNOSIS — F17210 Nicotine dependence, cigarettes, uncomplicated: Secondary | ICD-10-CM | POA: Insufficient documentation

## 2018-08-11 HISTORY — DX: Disorder of kidney and ureter, unspecified: N28.9

## 2018-08-11 LAB — AMMONIA: Ammonia: 12 umol/L (ref 9–35)

## 2018-08-11 LAB — CBC WITH DIFFERENTIAL/PLATELET
Abs Immature Granulocytes: 0.04 10*3/uL (ref 0.00–0.07)
Basophils Absolute: 0 10*3/uL (ref 0.0–0.1)
Basophils Relative: 0 %
Eosinophils Absolute: 0.1 10*3/uL (ref 0.0–0.5)
Eosinophils Relative: 1 %
HCT: 51.6 % (ref 39.0–52.0)
Hemoglobin: 16.6 g/dL (ref 13.0–17.0)
Immature Granulocytes: 1 %
Lymphocytes Relative: 26 %
Lymphs Abs: 2 10*3/uL (ref 0.7–4.0)
MCH: 31.1 pg (ref 26.0–34.0)
MCHC: 32.2 g/dL (ref 30.0–36.0)
MCV: 96.6 fL (ref 80.0–100.0)
Monocytes Absolute: 0.6 10*3/uL (ref 0.1–1.0)
Monocytes Relative: 7 %
NRBC: 0 % (ref 0.0–0.2)
Neutro Abs: 4.9 10*3/uL (ref 1.7–7.7)
Neutrophils Relative %: 65 %
Platelets: 268 10*3/uL (ref 150–400)
RBC: 5.34 MIL/uL (ref 4.22–5.81)
RDW: 13.7 % (ref 11.5–15.5)
WBC: 7.6 10*3/uL (ref 4.0–10.5)

## 2018-08-11 LAB — LACTIC ACID, PLASMA
Lactic Acid, Venous: 1 mmol/L (ref 0.5–1.9)
Lactic Acid, Venous: 0.8 mmol/L (ref 0.5–1.9)

## 2018-08-11 LAB — COMPREHENSIVE METABOLIC PANEL
ALT: 20 U/L (ref 0–44)
AST: 16 U/L (ref 15–41)
Albumin: 3.9 g/dL (ref 3.5–5.0)
Alkaline Phosphatase: 49 U/L (ref 38–126)
Anion gap: 6 (ref 5–15)
BUN: 15 mg/dL (ref 6–20)
CHLORIDE: 107 mmol/L (ref 98–111)
CO2: 23 mmol/L (ref 22–32)
Calcium: 8.8 mg/dL — ABNORMAL LOW (ref 8.9–10.3)
Creatinine, Ser: 0.98 mg/dL (ref 0.61–1.24)
GFR calc Af Amer: >60 mL/min (ref >60)
GFR calc non Af Amer: >60 mL/min (ref >60)
Glucose, Bld: 81 mg/dL (ref 70–99)
Potassium: 5.3 mmol/L — ABNORMAL HIGH (ref 3.5–5.1)
Sodium: 136 mmol/L (ref 135–145)
Total Bilirubin: 0.5 mg/dL (ref 0.3–1.2)
Total Protein: 6.9 g/dL (ref 6.5–8.1)

## 2018-08-11 LAB — CBG MONITORING, ED: Glucose-Capillary: 92 mg/dL (ref 70–99)

## 2018-08-11 LAB — RAPID URINE DRUG SCREEN, HOSP PERFORMED
Amphetamines: NOT DETECTED
Barbiturates: NOT DETECTED
Benzodiazepines: NOT DETECTED
Cocaine: NOT DETECTED
Opiates: NOT DETECTED
Tetrahydrocannabinol: NOT DETECTED

## 2018-08-11 LAB — URINALYSIS, ROUTINE W REFLEX MICROSCOPIC
Bacteria, UA: NONE SEEN
Bilirubin Urine: NEGATIVE
Glucose, UA: NEGATIVE mg/dL
Ketones, ur: NEGATIVE mg/dL
Leukocytes,Ua: NEGATIVE
Nitrite: NEGATIVE
Protein, ur: NEGATIVE mg/dL
Specific Gravity, Urine: 1.01 (ref 1.005–1.030)
pH: 5 (ref 5.0–8.0)

## 2018-08-11 LAB — TSH: TSH: 1.242 u[IU]/mL (ref 0.350–4.500)

## 2018-08-11 LAB — ETHANOL

## 2018-08-11 MED ORDER — SODIUM CHLORIDE 0.9 % IV BOLUS
1000.0000 mL | Freq: Once | INTRAVENOUS | Status: AC
  Administered 2018-08-11: 1000 mL via INTRAVENOUS

## 2018-08-11 NOTE — ED Provider Notes (Signed)
Emergency Department Provider Note   I have reviewed the triage vital signs and the nursing notes.   HISTORY  Chief Complaint Altered Mental Status   HPI John Jones is a 48 y.o. male with PMH of kidney stones, HTN, and OSA presents to the emergency department for evaluation of intermittent confusion, lightheadedness, difficulty speaking over the past 4 days.  The patient's wife states that she is noticed confusion and unusual affect.  She states the patient is crying at times and she feels like he is having trouble getting his words out.  Earlier in the illness course the patient was having some difficulty with walking.  He reports a mild headache but denies fevers or shaking chills.  No neck or back pain.  Wife notes elevated blood pressure on Saturday but that seems to have improved.  Patient is being monitored as an outpatient with a known right-sided kidney stone.  He has followed with his urologist who plans to intervene if the stone is not passed within 2 weeks.  Patient states he continues to have some right flank pain but that worsening significantly.  No new medications.  Patient drinks alcohol occasionally but not daily.  Denies any illicit drug use.   Past Medical History:  Diagnosis Date  . History of kidney stones    multiple episodes  . Hypertension   . Migraine   . Renal disorder    kidney stone  . Right ureteral stone   . Sleep apnea    Uses CPAP    Patient Active Problem List   Diagnosis Date Noted  . S/P right rotator cuff repair 07/03/17 07/08/2017  . Bursitis of right shoulder   . Ureteral stone with hydronephrosis 07/25/2016  . Abscess of finger of right hand     Past Surgical History:  Procedure Laterality Date  . CARPAL TUNNEL RELEASE Right 12/08/2014   Procedure: RIGHT CARPAL TUNNEL RELEASE;  Surgeon: Iran Planas, MD;  Location: [place];  Service: Orthopedics;  Laterality: Right;  . CYSTOSCOPY Right 12/2015   stent placed and removed   .  CYSTOSCOPY W/ URETERAL STENT PLACEMENT Right 07/25/2016   Procedure: CYSTOSCOPY/RETROGRADE/URETEROSCOPY/STENT PLACEMENT;  Surgeon: Alexis Frock, MD;  Location: [facility];  Service: Urology;  Laterality: Right;  . INCISION AND DRAINAGE Right 01/19/2016   Procedure: INCISION AND DRAINAGE RIGHT Vernetta Dizdarevic FINGER;  Surgeon: Carole Civil, MD;  Location: [facility];  Service: Orthopedics;  Laterality: Right;  RIGHT Kalii Chesmore FINGER - PT COMING AT 9:00 FOR LABWORK  . SHOULDER ARTHROSCOPY WITH OPEN ROTATOR CUFF REPAIR Right 07/03/2017   Procedure: SHOULDER ARTHROSCOPY;  Surgeon: Carole Civil, MD;  Location: [facility];  Service: Orthopedics;  Laterality: Right;  . SHOULDER OPEN ROTATOR CUFF REPAIR Right 07/03/2017   Procedure: ROTATOR CUFF REPAIR SHOULDER OPEN;  Surgeon: Carole Civil, MD;  Location: [facility];  Service: Orthopedics;  Laterality: Right;  . WRIST ARTHROSCOPY Right 12/08/2014   Procedure: RIGHT WRIST ARTHROSCOPY WITH DEBRIDEMENT/OR REPAIR;  Surgeon: Iran Planas, MD;  Location: [place];  Service: Orthopedics;  Laterality: Right;    Allergies Penicillins  No family history on file.  Social History Social History   Tobacco Use  . Smoking status: Current Every Day Smoker    Packs/day: 0.50    Years: 8.00    Pack years: 4.00    Types: Cigarettes  . Smokeless tobacco: Never Used  Substance Use Topics  . Alcohol use: Yes    Comment: occ  . Drug use:  No    Review of Systems  Constitutional: No fever/chills Eyes: No visual changes. ENT: No sore throat. Cardiovascular: Denies chest pain. Respiratory: Denies shortness of breath. Gastrointestinal: No abdominal pain.  No nausea, no vomiting.  No diarrhea.  No constipation. Genitourinary: Negative for dysuria. Continued right flank pain but not significantly worsening.  Musculoskeletal: Negative for back pain. Skin: Negative for rash. Neurological: Negative for focal weakness or numbness. Positive HA. Positive confusion  (intermittent).   10-point ROS otherwise negative.  ____________________________________________   PHYSICAL EXAM:  VITAL SIGNS: ED Triage Vitals  Enc Vitals Group     BP 08/11/18 1242 (!) 143/106     Pulse Rate 08/11/18 1242 93     Resp 08/11/18 1242 10     Temp 08/11/18 1242 98.2 F (36.8 C)     Temp Source 08/11/18 1242 Oral     SpO2 08/11/18 1242 100 %     Weight 08/11/18 1243 165 lb (74.8 kg)     Height 08/11/18 1243 5\' 3"  (1.6 m)     Pain Score 08/11/18 1243 4   Constitutional: Alert and oriented. Well appearing and in no acute distress. Eyes: Conjunctivae are normal. PERRL. EOMI. No photophobia.  Head: Atraumatic. Nose: No congestion/rhinnorhea. Mouth/Throat: Mucous membranes are moist.  Neck: No stridor.  No meningeal signs.   Cardiovascular: Normal rate, regular rhythm. Good peripheral circulation. Grossly normal heart sounds.   Respiratory: Normal respiratory effort.  No retractions. Lungs CTAB. Gastrointestinal: Soft and nontender. No distention.  Musculoskeletal: No lower extremity tenderness nor edema. No gross deformities of extremities. Neurologic:  Normal speech and language. No gross focal neurologic deficits are appreciated. Normal CN exam 2-12. No pronator drift. No dysarthria.  Skin:  Skin is warm, dry and intact. No rash noted.  ____________________________________________   LABS (all labs ordered are listed, but only abnormal results are displayed)  Labs Reviewed  COMPREHENSIVE METABOLIC PANEL - Abnormal; Notable for the following components:      Result Value   Potassium 5.3 (*)    Calcium 8.8 (*)    All other components within normal limits  URINALYSIS, ROUTINE W REFLEX MICROSCOPIC - Abnormal; Notable for the following components:   Color, Urine STRAW (*)    Hgb urine dipstick SMALL (*)    All other components within normal limits  URINE CULTURE  ETHANOL  LACTIC ACID, PLASMA  LACTIC ACID, PLASMA  CBC WITH DIFFERENTIAL/PLATELET  AMMONIA    TSH  RAPID URINE DRUG SCREEN, HOSP PERFORMED  CBG MONITORING, ED   ____________________________________________  EKG   EKG Interpretation  Date/Time:  Tuesday August 11 2018 12:41:16 EDT Ventricular Rate:  96 PR Interval:    QRS Duration: 76 QT Interval:  334 QTC Calculation: 422 R Axis:   86 Text Interpretation:  Sinus rhythm No STEMI.  Confirmed by Nanda Quinton [phone]) on 08/11/2018 1:04:51 PM       ____________________________________________  RADIOLOGY  Mr Brain Wo Contrast  Result Date: 08/11/2018 CLINICAL DATA:  Altered mental status EXAM: MRI HEAD WITHOUT CONTRAST TECHNIQUE: Multiplanar, multiecho pulse sequences of the brain and surrounding structures were obtained without intravenous contrast. COMPARISON:  None. FINDINGS: BRAIN: There is no acute infarct, acute hemorrhage, hydrocephalus or extra-axial collection. The midline structures are normal. No midline shift or other mass effect. Minimal white matter hyperintensity, nonspecific and commonly seen in asymptomatic patients of this age. The cerebral and cerebellar volume are age-appropriate. Susceptibility-sensitive sequences show no chronic microhemorrhage or superficial siderosis. VASCULAR: Major intracranial arterial and venous sinus  flow voids are normal. SKULL AND UPPER CERVICAL SPINE: Calvarial bone marrow signal is normal. There is no skull base mass. Visualized upper cervical spine and soft tissues are normal. SINUSES/ORBITS: No fluid levels or advanced mucosal thickening. No mastoid or middle ear effusion. The orbits are normal. IMPRESSION: Minimal nonspecific white matter hyperintensity, which may be secondary to chronic small vessel disease, though also commonly seen in normal patients of this age. Otherwise normal brain MRI. Electronically Signed   By: Ulyses Jarred M.D.   On: 08/11/2018 13:58    ____________________________________________   PROCEDURES  Procedure(s) performed:    Procedures  None ____________________________________________   INITIAL IMPRESSION / ASSESSMENT AND PLAN / ED COURSE  Pertinent labs & imaging results that were available during my care of the patient were reviewed by me and considered in my medical decision making (see chart for details).  Patient presents to the emergency department with intermittent altered mental status with lightheadedness, elevated blood pressure at times and confusion.  He has no signs or symptoms of infectious etiology to explain his symptoms.  He is being treated for right ureteral stone and has follow-up scheduled with his urologist.  No worsening pain in this area.  Low suspicion for sepsis related to stone or other cause.  Plan for MRI of the brain given description of symptoms and elevated BP.  Blood pressure here is mildly elevated.  Doubt hypertension emergency.   MRI negative. Labs unremarkable. Patient feeling improved here. Plan for PCP follow up. Discussed ED return precautions.  ____________________________________________  FINAL CLINICAL IMPRESSION(S) / ED DIAGNOSES  Final diagnoses:  Transient alteration of awareness     MEDICATIONS GIVEN DURING THIS VISIT:  Medications  sodium chloride 0.9 % bolus 1,000 mL (0 mLs Intravenous Stopped 08/11/18 1433)    Note:  This document was prepared using Dragon voice recognition software and may include unintentional dictation errors.  Nanda Quinton, MD Emergency Medicine    Verlisa Vara, [facility], MD 08/11/18 [phone]

## 2018-08-11 NOTE — ED Notes (Signed)
cbg [id number] 

## 2018-08-11 NOTE — ED Triage Notes (Signed)
Pt's family reports that on Saturday, pt began c/o dizziness and HTN (170/112 BP) and reports some confusion. Pt took his BP medication and reports that he "felt better." Pt reports that he "didn't feel good yesterday." Today, family reports confusion (AOx4 at this time) and difficulty speaking. No aphasia or difficulty speaking noted at this time.

## 2018-08-11 NOTE — Discharge Instructions (Signed)
Your MRI and labs were normal. We did not find evidence of a stroke. Follow up with your PCP and return to the ED with any new or worsening symptoms.

## 2018-08-13 LAB — URINE CULTURE
CULTURE: NO GROWTH
Special Requests: NORMAL

## 2018-08-17 DIAGNOSIS — M79641 Pain in right hand: Secondary | ICD-10-CM | POA: Diagnosis not present

## 2018-08-17 DIAGNOSIS — M67431 Ganglion, right wrist: Secondary | ICD-10-CM | POA: Diagnosis not present

## 2018-08-26 DIAGNOSIS — M9905 Segmental and somatic dysfunction of pelvic region: Secondary | ICD-10-CM | POA: Diagnosis not present

## 2018-08-26 DIAGNOSIS — M545 Low back pain: Secondary | ICD-10-CM | POA: Diagnosis not present

## 2018-08-26 DIAGNOSIS — M9903 Segmental and somatic dysfunction of lumbar region: Secondary | ICD-10-CM | POA: Diagnosis not present

## 2018-08-28 DIAGNOSIS — M9903 Segmental and somatic dysfunction of lumbar region: Secondary | ICD-10-CM | POA: Diagnosis not present

## 2018-08-28 DIAGNOSIS — M9905 Segmental and somatic dysfunction of pelvic region: Secondary | ICD-10-CM | POA: Diagnosis not present

## 2018-08-28 DIAGNOSIS — M545 Low back pain: Secondary | ICD-10-CM | POA: Diagnosis not present

## 2018-08-31 ENCOUNTER — Telehealth: Payer: Self-pay | Admitting: Orthopedic Surgery

## 2018-08-31 DIAGNOSIS — M9905 Segmental and somatic dysfunction of pelvic region: Secondary | ICD-10-CM | POA: Diagnosis not present

## 2018-08-31 DIAGNOSIS — M9903 Segmental and somatic dysfunction of lumbar region: Secondary | ICD-10-CM | POA: Diagnosis not present

## 2018-08-31 DIAGNOSIS — M545 Low back pain: Secondary | ICD-10-CM | POA: Diagnosis not present

## 2018-08-31 NOTE — Telephone Encounter (Signed)
Patient called wanting to see Dr. Aline Brochure for back pain. I suggested him see his PCP first and he stated he had and was given muscle relaxers and antiinflammatory medication which has not helped. This issue is going into three weeks now. I suggested that he give his PCP's office a call to let them know exactly what he told me and see what else they can do for him. Then if he still needs to be seen by Dr. Aline Brochure I would be glad to talk with the doctor for advisement. He was in agreement with this.

## 2018-09-02 DIAGNOSIS — M545 Low back pain: Secondary | ICD-10-CM | POA: Diagnosis not present

## 2018-09-04 DIAGNOSIS — M9905 Segmental and somatic dysfunction of pelvic region: Secondary | ICD-10-CM | POA: Diagnosis not present

## 2018-09-04 DIAGNOSIS — M545 Low back pain: Secondary | ICD-10-CM | POA: Diagnosis not present

## 2018-09-04 DIAGNOSIS — M9903 Segmental and somatic dysfunction of lumbar region: Secondary | ICD-10-CM | POA: Diagnosis not present

## 2018-09-23 DIAGNOSIS — M545 Low back pain: Secondary | ICD-10-CM | POA: Diagnosis not present

## 2018-09-24 DIAGNOSIS — M545 Low back pain: Secondary | ICD-10-CM | POA: Diagnosis not present

## 2019-08-05 DIAGNOSIS — I10 Essential (primary) hypertension: Secondary | ICD-10-CM | POA: Insufficient documentation

## 2019-08-27 ENCOUNTER — Other Ambulatory Visit: Payer: Self-pay | Admitting: Neurological Surgery

## 2019-08-27 DIAGNOSIS — Z6827 Body mass index (BMI) 27.0-27.9, adult: Secondary | ICD-10-CM | POA: Insufficient documentation

## 2019-08-27 DIAGNOSIS — M5416 Radiculopathy, lumbar region: Secondary | ICD-10-CM

## 2019-09-29 ENCOUNTER — Encounter (HOSPITAL_COMMUNITY): Payer: Self-pay

## 2019-09-29 ENCOUNTER — Ambulatory Visit (HOSPITAL_COMMUNITY): Payer: 59

## 2019-09-29 ENCOUNTER — Ambulatory Visit (HOSPITAL_COMMUNITY)
Admission: RE | Admit: 2019-09-29 | Discharge: 2019-09-29 | Disposition: A | Discharge status: Home / Self Care | Payer: 59 | Source: Ambulatory Visit | Attending: Neurological Surgery | Admitting: Neurological Surgery

## 2019-09-29 ENCOUNTER — Other Ambulatory Visit: Payer: Self-pay

## 2019-09-29 DIAGNOSIS — M5416 Radiculopathy, lumbar region: Secondary | ICD-10-CM | POA: Diagnosis present

## 2019-10-29 DIAGNOSIS — M461 Sacroiliitis, not elsewhere classified: Secondary | ICD-10-CM | POA: Insufficient documentation

## 2019-11-02 ENCOUNTER — Ambulatory Visit: Payer: Self-pay

## 2019-12-10 DIAGNOSIS — M545 Low back pain, unspecified: Secondary | ICD-10-CM | POA: Insufficient documentation

## 2020-02-22 DIAGNOSIS — M47816 Spondylosis without myelopathy or radiculopathy, lumbar region: Secondary | ICD-10-CM | POA: Insufficient documentation

## 2020-05-10 DIAGNOSIS — L02511 Cutaneous abscess of right hand: Secondary | ICD-10-CM

## 2020-05-10 DIAGNOSIS — M7551 Bursitis of right shoulder: Secondary | ICD-10-CM

## 2020-06-26 ENCOUNTER — Ambulatory Visit
Admission: EM | Admit: 2020-06-26 | Discharge: 2020-06-26 | Disposition: A | Discharge status: Home / Self Care | Payer: 59 | Attending: Emergency Medicine | Admitting: Emergency Medicine

## 2020-06-26 ENCOUNTER — Encounter: Payer: Self-pay | Admitting: Emergency Medicine

## 2020-06-26 ENCOUNTER — Ambulatory Visit (INDEPENDENT_AMBULATORY_CARE_PROVIDER_SITE_OTHER): Payer: 59

## 2020-06-26 DIAGNOSIS — S6991XA Unspecified injury of right wrist, hand and finger(s), initial encounter: Secondary | ICD-10-CM | POA: Diagnosis not present

## 2020-06-26 DIAGNOSIS — W19XXXA Unspecified fall, initial encounter: Secondary | ICD-10-CM | POA: Diagnosis not present

## 2020-06-26 DIAGNOSIS — M25541 Pain in joints of right hand: Secondary | ICD-10-CM | POA: Diagnosis not present

## 2020-06-26 DIAGNOSIS — M79641 Pain in right hand: Secondary | ICD-10-CM | POA: Diagnosis not present

## 2020-06-26 NOTE — Discharge Instructions (Addendum)
3 days off work note was given Continue to take Ibuprofen 800 mg as needed for pain /take it with food Follow RICE instruction that is attached Continue to use Ace wrap Follow-up with PCP Return or go to ED if you develop any new or worsening of your symptoms

## 2020-06-26 NOTE — ED Provider Notes (Addendum)
[place]   [id number] 06/26/20 Arrival Time: 0806   Chief Complaint  Patient presents with  . Hand Injury     SUBJECTIVE: History from: patient.  John Jones is a 50 y.o. male who presented to the urgent care with a complaint of right hand pain that started yesterday.  Developed the symptom after falling.  He localizes the pain to the right hand.  He describes the pain as constant and achy.  He has tried ibuprofen 800 mg with mild relief.  His symptoms are made worse with ROM.  He denies similar symptoms in the past.  Denies chills, fever, nausea, vomiting, diarrhea.   ROS: As per HPI.  All other pertinent ROS negative.       Past Medical History:  Diagnosis Date  . History of kidney stones    multiple episodes  . Hypertension   . Migraine   . Renal disorder    kidney stone  . Right ureteral stone   . Sleep apnea    Uses CPAP   Past Surgical History:  Procedure Laterality Date  . CARPAL TUNNEL RELEASE Right 12/08/2014   Procedure: RIGHT CARPAL TUNNEL RELEASE;  Surgeon: Iran Planas, MD;  Location: [facility];  Service: Orthopedics;  Laterality: Right;  . CYSTOSCOPY Right 12/2015   stent placed and removed   . CYSTOSCOPY W/ URETERAL STENT PLACEMENT Right 07/25/2016   Procedure: CYSTOSCOPY/RETROGRADE/URETEROSCOPY/STENT PLACEMENT;  Surgeon: Alexis Frock, MD;  Location: [facility];  Service: Urology;  Laterality: Right;  . INCISION AND DRAINAGE Right 01/19/2016   Procedure: INCISION AND DRAINAGE RIGHT LONG FINGER;  Surgeon: Carole Civil, MD;  Location: [facility];  Service: Orthopedics;  Laterality: Right;  RIGHT LONG FINGER - PT COMING AT 9:00 FOR LABWORK  . SHOULDER ARTHROSCOPY WITH OPEN ROTATOR CUFF REPAIR Right 07/03/2017   Procedure: SHOULDER ARTHROSCOPY;  Surgeon: Carole Civil, MD;  Location: [facility];  Service: Orthopedics;  Laterality: Right;  . SHOULDER OPEN ROTATOR CUFF REPAIR Right 07/03/2017   Procedure: ROTATOR CUFF REPAIR  SHOULDER OPEN;  Surgeon: Carole Civil, MD;  Location: [facility];  Service: Orthopedics;  Laterality: Right;  . WRIST ARTHROSCOPY Right 12/08/2014   Procedure: RIGHT WRIST ARTHROSCOPY WITH DEBRIDEMENT/OR REPAIR;  Surgeon: Iran Planas, MD;  Location: [place] [facility];  Service: Orthopedics;  Laterality: Right;   Allergies  Allergen Reactions  . Penicillins Other (See Comments)    Unknown-childhood Has patient had a PCN reaction causing immediate rash, facial/tongue/throat swelling, SOB or lightheadedness with hypotension: Unknown Has patient had a PCN reaction causing severe rash involving mucus membranes or skin necrosis: Unknown Has patient had a PCN reaction that required hospitalization: Unknown Has patient had a PCN reaction occurring within the last 10 years: No If all of the above answers are "NO", then may proceed with Cephalosporin use.    No current facility-administered medications on file prior to encounter.   Current Outpatient Medications on File Prior to Encounter  Medication Sig Dispense Refill  . amphetamine-dextroamphetamine (ADDERALL) 20 MG tablet Take 1 tablet by mouth 2 (two) times daily.    . Aspirin-Salicylamide-Caffeine (BC HEADACHE POWDER PO) Take 1 packet by mouth daily as needed (for headaches.).    Marland Kitchen ketorolac (TORADOL) 10 MG tablet Take 1 tablet (10 mg total) by mouth every 6 (six) hours as needed for moderate pain. (Patient not taking: Reported on 08/11/2018) 20 tablet 0  . oxyCODONE-acetaminophen (PERCOCET/ROXICET) 5-325 MG tablet Take 1 tablet by mouth every 6 (six) hours as needed  for severe pain. (Patient not taking: Reported on 08/11/2018) 12 tablet 0   Social History   Socioeconomic History  . Marital status: Married    Spouse name: Not on file  . Number of children: Not on file  . Years of education: Not on file  . Highest education level: Not on file  Occupational History  . Not on file  Tobacco Use  . Smoking status: Current Every Day Smoker     Packs/day: 0.50    Years: 8.00    Pack years: 4.00    Types: Cigarettes  . Smokeless tobacco: Never Used  Vaping Use  . Vaping Use: Some days  . Substances: Nicotine, Flavoring  Substance and Sexual Activity  . Alcohol use: Yes    Comment: occ  . Drug use: No  . Sexual activity: Yes    Birth control/protection: None  Other Topics Concern  . Not on file  Social History Narrative  . Not on file   Social Determinants of Health   Financial Resource Strain: Not on file  Food Insecurity: Not on file  Transportation Needs: Not on file  Physical Activity: Not on file  Stress: Not on file  Social Connections: Not on file  Intimate Partner Violence: Not on file   History reviewed. No pertinent family history.  OBJECTIVE:  Vitals:   06/26/20 0817  BP: (!) 151/91  Pulse: 86  Resp: 18  Temp: 97.9 F (36.6 C)  TempSrc: Oral  SpO2: 98%  Weight: 145 lb (65.8 kg)  Height: 5\' 3"  (1.6 m)     Physical Exam Vitals and nursing note reviewed.  Constitutional:      General: He is not in acute distress.    Appearance: Normal appearance. He is normal weight. He is not ill-appearing, toxic-appearing or diaphoretic.  Cardiovascular:     Rate and Rhythm: Normal rate and regular rhythm.     Pulses: Normal pulses.     Heart sounds: Normal heart sounds. No murmur heard. No friction rub. No gallop.   Pulmonary:     Effort: Pulmonary effort is normal. No respiratory distress.     Breath sounds: Normal breath sounds. No stridor. No wheezing, rhonchi or rales.  Chest:     Chest wall: No tenderness.  Musculoskeletal:        General: Tenderness present.     Right hand: Swelling and tenderness present.     Left hand: Normal.     Comments: Right hand is with obvious deformity compared to the left hand.  Swelling and tenderness present.  There is no ecchymosis, open wound, lesion, surface trauma or warmth present.  Limited range of motion.  Neurovascular status intact.  Neurological:      Mental Status: He is alert and oriented to person, place, and time.       LABS:  No results found for this or any previous visit (from the past 24 hour(s)).   RADIOLOGY:  DG Hand Complete Right  Result Date: 06/26/2020 CLINICAL DATA:  Pain status post fall yesterday. EXAM: RIGHT HAND - COMPLETE 3+ VIEW COMPARISON:  None. FINDINGS: 2 mm ossified fragment adjacent to the base of the distal phalanx of the thumb is favored to be sequelae of remote injury. No acute fracture or dislocation. IMPRESSION: 2 mm os ossific fragment adjacent to the base of the distal phalanx of the thumb favored to be sequelae of remote injury. If the patient has focal tenderness at this site, this could be an acute fracture. Exam  is otherwise unremarkable. Electronically Signed   By: Miachel Roux M.D.   On: 06/26/2020 08:28   Right hand x-ray is negative for acute fracture or dislocation.  I have reviewed the x-ray myself and the radiologist interpretation.  I am in agreement with the radiologist interpretation.  ASSESSMENT & PLAN:  1. Fall, initial encounter   2. Right hand pain   3. Hand injury, right, initial encounter     No orders of the defined types were placed in this encounter.   Discharge Instructions    3 days off work note was given Continue to take Ibuprofen 800 mg as needed for pain /take it with food Follow RICE instruction that is attached Continue to use Ace wrap Follow-up with PCP Return or go to ED if you develop any new or worsening of your symptoms   Reviewed expectations re: course of current medical issues. Questions answered. Outlined signs and symptoms indicating need for more acute intervention. Patient verbalized understanding. After Visit Summary given.         Emerson Monte, FNP 06/26/20 [place], [place], [place] 06/26/20 [phone]

## 2020-06-26 NOTE — ED Triage Notes (Signed)
Fell yesterday and felt something pop in right hand

## 2020-10-26 NOTE — Telephone Encounter (Signed)
error 

## 2020-11-21 ENCOUNTER — Telehealth: Payer: Self-pay | Admitting: Orthopedic Surgery

## 2020-11-21 NOTE — Telephone Encounter (Signed)
Patient called about a [employer] medical records request - aware we sent it to our provider [facility].

## 2020-12-05 ENCOUNTER — Encounter (INDEPENDENT_AMBULATORY_CARE_PROVIDER_SITE_OTHER): Payer: Self-pay | Admitting: *Deleted

## 2021-04-09 ENCOUNTER — Encounter (INDEPENDENT_AMBULATORY_CARE_PROVIDER_SITE_OTHER): Payer: Self-pay

## 2021-04-09 ENCOUNTER — Telehealth (INDEPENDENT_AMBULATORY_CARE_PROVIDER_SITE_OTHER): Payer: Self-pay

## 2021-04-09 ENCOUNTER — Encounter (INDEPENDENT_AMBULATORY_CARE_PROVIDER_SITE_OTHER): Payer: Self-pay | Admitting: Gastroenterology

## 2021-04-09 ENCOUNTER — Other Ambulatory Visit: Payer: Self-pay

## 2021-04-09 ENCOUNTER — Ambulatory Visit (INDEPENDENT_AMBULATORY_CARE_PROVIDER_SITE_OTHER): Payer: 59 | Admitting: Gastroenterology

## 2021-04-09 DIAGNOSIS — R12 Heartburn: Secondary | ICD-10-CM | POA: Diagnosis not present

## 2021-04-09 DIAGNOSIS — R079 Chest pain, unspecified: Secondary | ICD-10-CM | POA: Insufficient documentation

## 2021-04-09 MED ORDER — OMEPRAZOLE 40 MG PO CPDR: 40.0000 mg | DELAYED_RELEASE_CAPSULE | Freq: Two times a day (BID) | ORAL | 0 refills | Status: DC

## 2021-04-09 MED ORDER — PEG 3350-KCL-NA BICARB-NACL 420 G PO SOLR: 4000.0000 mL | ORAL | 0 refills | Status: DC

## 2021-04-09 NOTE — Telephone Encounter (Signed)
John Jones, CMA  

## 2021-04-09 NOTE — Progress Notes (Signed)
John Jones, M.D. Gastroenterology & Hepatology [facility] [address] [place], [place] [address] Primary Care Physician: Celene Squibb, MD [place] [address]  Referring MD: PCP  Chief Complaint:  Chest pain  History of Present Illness: John Jones is a 50 y.o. male with past medical history of hypertension, migraines, OSA, ADHD, urolithiasis who presents for evaluation of chest pain and heartburn.  Patient reports that since March 2022 he has presented recurrent episodes of burning pain in his mid retrosternal area which does not radiate anywhere else. He reports that the pain lasts usually an hour but is present at least every day.  He describes the pain as a burning sensation.  He denies any abdominal pain.  He was prescribed pantoprazole 40 mg once a day (by his PCP without improvement. He is currently taking omeprazole 20 mg twice a day, which he takes 5:30 AM and 5:30 PM. He has been taking this medication for the last two months. States it somewhat helps with the symptoms but usually the heartburn comes back after he eats. Believes that eating spicy food makes it worse. Denied having any shortness of breath or chest pain with exertion.Denies any dysphagia or odynophagia. He reports that he has been taking Mylanta occasionally but has nor teally helped and has led to diarrhea.    Has notices some bloating since March as well.  The patient denies having any nausea, vomiting, fever, chills, hematochezia, melena, hematemesis, abdominal pain, diarrhea, jaundice, pruritus or weight loss.  Most recent abdominal imaging CT abdomen without IV contrast on 08/07/2019 showed a 4 mm left UPJ calculus without hydronephrosis.  Has been taking Goody powders for the last 30 years every day for headaches.  Last OYD:XAJOI Last Colonoscopy:never  FHx: neg for any gastrointestinal/liver disease, mother lung  cancer Social: smokes a pack a day, drinks beer once a week, neg illicit drug use Surgical: no abdominal surgeries  Past Medical History: Past Medical History:  Diagnosis Date   History of kidney stones    multiple episodes   Hypertension    Migraine    Renal disorder    kidney stone   Right ureteral stone    Sleep apnea    Uses CPAP    Past Surgical History: Past Surgical History:  Procedure Laterality Date   CARPAL TUNNEL RELEASE Right 12/08/2014   Procedure: RIGHT CARPAL TUNNEL RELEASE;  Surgeon: Iran Planas, MD;  Location: [facility];  Service: Orthopedics;  Laterality: Right;   CYSTOSCOPY Right 12/2015   stent placed and removed    CYSTOSCOPY W/ URETERAL STENT PLACEMENT Right 07/25/2016   Procedure: CYSTOSCOPY/RETROGRADE/URETEROSCOPY/STENT PLACEMENT;  Surgeon: Alexis Frock, MD;  Location[facility];  Service: Urology;  Laterality: Right;   INCISION AND DRAINAGE Right 01/19/2016   Procedure: INCISION AND DRAINAGE RIGHT LONG FINGER;  Surgeon: Carole Civil, MD;  Location: [facility];  Service: Orthopedics;  Laterality: Right;  RIGHT LONG FINGER - PT COMING AT 9:00 FOR LABWORK   SHOULDER ARTHROSCOPY WITH OPEN ROTATOR CUFF REPAIR Right 07/03/2017   Procedure: SHOULDER ARTHROSCOPY;  Surgeon: Carole Civil, MD;  Location: [facility];  Service: Orthopedics;  Laterality: Right;   SHOULDER OPEN ROTATOR CUFF REPAIR Right 07/03/2017   Procedure: ROTATOR CUFF REPAIR SHOULDER OPEN;  Surgeon: Carole Civil, MD;  Location: [facility];  Service: Orthopedics;  Laterality: Right;   WRIST ARTHROSCOPY Right 12/08/2014   Procedure: RIGHT WRIST ARTHROSCOPY WITH DEBRIDEMENT/OR REPAIR;  Surgeon: Iran Planas, MD;  Location: [facility];  Service: Orthopedics;  Laterality: Right;    Family History: Family History  Problem Relation Age of Onset   Lung cancer Mother    Heart disease Father    Emphysema Father    Bone cancer Father     Social History: Social History   Tobacco Use   Smoking Status Every Day   Packs/day: 0.50   Years: 8.00   Pack years: 4.00   Types: Cigarettes  Smokeless Tobacco Never   Social History   Substance and Sexual Activity  Alcohol Use Yes   Comment: occ   Social History   Substance and Sexual Activity  Drug Use No    Allergies: Allergies  Allergen Reactions   Penicillins Other (See Comments)    Unknown-childhood Has patient had a PCN reaction causing immediate rash, facial/tongue/throat swelling, SOB or lightheadedness with hypotension: Unknown Has patient had a PCN reaction causing severe rash involving mucus membranes or skin necrosis: Unknown Has patient had a PCN reaction that required hospitalization: Unknown Has patient had a PCN reaction occurring within the last 10 years: No If all of the above answers are "NO", then may proceed with Cephalosporin use.     Medications: Current Outpatient Medications  Medication Sig Dispense Refill   amphetamine-dextroamphetamine (ADDERALL) 20 MG tablet Take 1 tablet by mouth 2 (two) times daily.     Aspirin-Salicylamide-Caffeine (BC HEADACHE POWDER PO) Take 1 packet by mouth daily as needed (for headaches.).     omeprazole (PRILOSEC) 20 MG capsule Take 20 mg by mouth 2 (two) times daily before a meal.     No current facility-administered medications for this visit.    Review of Systems: GENERAL: negative for malaise, night sweats HEENT: No changes in hearing or vision, no nose bleeds or other nasal problems. NECK: Negative for lumps, goiter, pain and significant neck swelling RESPIRATORY: Negative for cough, wheezing CARDIOVASCULAR: Negative for chest pain, leg swelling, palpitations, orthopnea GI: SEE HPI MUSCULOSKELETAL: Negative for joint pain or swelling, back pain, and muscle pain. SKIN: Negative for lesions, rash PSYCH: Negative for sleep disturbance, mood disorder and recent psychosocial stressors. HEMATOLOGY Negative for prolonged bleeding, bruising easily, and  swollen nodes. ENDOCRINE: Negative for cold or heat intolerance, polyuria, polydipsia and goiter. NEURO: negative for tremor, gait imbalance, syncope and seizures. The remainder of the review of systems is noncontributory.   Physical Exam: BP (!) 144/90 (BP Location: Left Arm, Patient Position: Sitting, Cuff Size: Large)   Pulse 81   Temp 98.1 F (36.7 C) (Oral)   Ht 5\' 4"  (1.626 m)   Wt 156 lb 11.2 oz (71.1 kg)   BMI 26.90 kg/m  GENERAL: The patient is AO x3, in no acute distress. HEENT: Head is normocephalic and atraumatic. EOMI are intact. Mouth is well hydrated and without lesions. NECK: Supple. No masses LUNGS: Clear to auscultation. No presence of rhonchi/wheezing/rales. Adequate chest expansion CHEST: No pain upon palpation of the retrosternal area. HEART: RRR, normal s1 and s2. ABDOMEN: Soft, nontender, no guarding, no peritoneal signs, and nondistended. BS +. No masses. EXTREMITIES: Without any cyanosis, clubbing, rash, lesions or edema. NEUROLOGIC: AOx3, no focal motor deficit. SKIN: no jaundice, no rashes   Imaging/Labs: as above  I personally reviewed and interpreted the available labs, imaging and endoscopic files.  Impression and Plan: John Jones is a 50 y.o. male with past medical history of hypertension, migraines, OSA, ADHD, urolithiasis who presents for evaluation of chest pain and heartburn.  The patient has presented heartburn and pain in his chest which have not improved with twice daily PPI.  He describes the pain as heartburn and denies any abdominal symptoms.  I consider that this point it is important to investigate this further with an EGD given his respiratory response to PPI.  I discussed with him that it is possible his symptoms are related to GERD and they are not typical anginal symptoms.  However, given his poor partial to PPI it is important to rule out any organic conditions.  I will increase his omeprazole to 40 mg every 12 hours and he was  advised to avoid smoking.  He will also be important to decrease the intake of Goody powders as this will also increase his risk for gastrointestinal injuries.  The patient understood and agreed.  Finally, he is due for colorectal cancer screening, will schedule colonoscopy at the same time of his esophagogastroduodenospy.  - Increase omeprazole to 40 mg every 12 hours - Explained presumed etiology of reflux symptoms. Instruction provided in the use of antireflux medication - patient should take medication in the morning 30-45 minutes before eating breakfast. Discussed avoidance of eating within 2 hours of lying down to sleep and benefit of blocks to elevate head of bed. Also, will benefit from avoiding carbonated drinks/sodas or food that has tomatoes, spicy or greasy food. - Patient was counseled regarding the importance of stopping cigarette smoking. The patient was informed about the long term effects of smoking, progression of current disease. -Stop using NSAIDs such as Aleve, ibuprofen, naproxen, Motrin, Voltaren or Advil (even the topical ones) -Schedule EGD and colonoscopy  All questions were answered.      John Peppers, MD Gastroenterology and Hepatology [facility]

## 2021-04-09 NOTE — Patient Instructions (Addendum)
Increase omeprazole to 40 mg every 12 hours Explained presumed etiology of reflux symptoms. Instruction provided in the use of antireflux medication - patient should take medication in the morning 30-45 minutes before eating breakfast. Discussed avoidance of eating within 2 hours of lying down to sleep and benefit of blocks to elevate head of bed. Also, will benefit from avoiding carbonated drinks/sodas or food that has tomatoes, spicy or greasy food. Patient was counseled regarding the importance of stopping cigarette smoking. The patient was informed about the long term effects of smoking, progression of current disease. Stop using NSAIDs such as Aleve, ibuprofen, naproxen, Motrin, Voltaren or Advil (even the topical ones) Schedule EGD and colonoscopy

## 2021-04-09 NOTE — H&P (View-Only) (Signed)
John Jones, M.D. Gastroenterology & Hepatology [facility] [address] [place], [place] [address] Primary Care Physician: Celene Squibb, MD [place] [address]  Referring MD: PCP  Chief Complaint:  Chest pain  History of Present Illness: John Jones is a 50 y.o. male with past medical history of hypertension, migraines, OSA, ADHD, urolithiasis who presents for evaluation of chest pain and heartburn.  Patient reports that since March 2022 he has presented recurrent episodes of burning pain in his mid retrosternal area which does not radiate anywhere else. He reports that the pain lasts usually an hour but is present at least every day.  He describes the pain as a burning sensation.  He denies any abdominal pain.  He was prescribed pantoprazole 40 mg once a day (by his PCP without improvement. He is currently taking omeprazole 20 mg twice a day, which he takes 5:30 AM and 5:30 PM. He has been taking this medication for the last two months. States it somewhat helps with the symptoms but usually the heartburn comes back after he eats. Believes that eating spicy food makes it worse. Denied having any shortness of breath or chest pain with exertion.Denies any dysphagia or odynophagia. He reports that he has been taking Mylanta occasionally but has nor teally helped and has led to diarrhea.    Has notices some bloating since March as well.  The patient denies having any nausea, vomiting, fever, chills, hematochezia, melena, hematemesis, abdominal pain, diarrhea, jaundice, pruritus or weight loss.  Most recent abdominal imaging CT abdomen without IV contrast on 08/07/2019 showed a 4 mm left UPJ calculus without hydronephrosis.  Has been taking Goody powders for the last 30 years every day for headaches.  Last TML:YYTKP Last Colonoscopy:never  FHx: neg for any gastrointestinal/liver disease, mother lung  cancer Social: smokes a pack a day, drinks beer once a week, neg illicit drug use Surgical: no abdominal surgeries  Past Medical History: Past Medical History:  Diagnosis Date   History of kidney stones    multiple episodes   Hypertension    Migraine    Renal disorder    kidney stone   Right ureteral stone    Sleep apnea    Uses CPAP    Past Surgical History: Past Surgical History:  Procedure Laterality Date   CARPAL TUNNEL RELEASE Right 12/08/2014   Procedure: RIGHT CARPAL TUNNEL RELEASE;  Surgeon: Iran Planas, MD;  Location: [facility];  Service: Orthopedics;  Laterality: Right;   CYSTOSCOPY Right 12/2015   stent placed and removed    CYSTOSCOPY W/ URETERAL STENT PLACEMENT Right 07/25/2016   Procedure: CYSTOSCOPY/RETROGRADE/URETEROSCOPY/STENT PLACEMENT;  Surgeon: Alexis Frock, MD;  Location: [facility];  Service: Urology;  Laterality: Right;   INCISION AND DRAINAGE Right 01/19/2016   Procedure: INCISION AND DRAINAGE RIGHT LONG FINGER;  Surgeon: Carole Civil, MD;  Location: [facility];  Service: Orthopedics;  Laterality: Right;  RIGHT LONG FINGER - PT COMING AT 9:00 FOR LABWORK   SHOULDER ARTHROSCOPY WITH OPEN ROTATOR CUFF REPAIR Right 07/03/2017   Procedure: SHOULDER ARTHROSCOPY;  Surgeon: Carole Civil, MD;  Location: [facility];  Service: Orthopedics;  Laterality: Right;   SHOULDER OPEN ROTATOR CUFF REPAIR Right 07/03/2017   Procedure: ROTATOR CUFF REPAIR SHOULDER OPEN;  Surgeon: Carole Civil, MD;  Location: [facility];  Service: Orthopedics;  Laterality: Right;   WRIST ARTHROSCOPY Right 12/08/2014   Procedure: RIGHT WRIST ARTHROSCOPY WITH DEBRIDEMENT/OR REPAIR;  Surgeon: Iran Planas, MD;  Location: [facility];  Service: Orthopedics;  Laterality: Right;    Family History: Family History  Problem Relation Age of Onset   Lung cancer Mother    Heart disease Father    Emphysema Father    Bone cancer Father     Social History: Social History   Tobacco Use   Smoking Status Every Day   Packs/day: 0.50   Years: 8.00   Pack years: 4.00   Types: Cigarettes  Smokeless Tobacco Never   Social History   Substance and Sexual Activity  Alcohol Use Yes   Comment: occ   Social History   Substance and Sexual Activity  Drug Use No    Allergies: Allergies  Allergen Reactions   Penicillins Other (See Comments)    Unknown-childhood Has patient had a PCN reaction causing immediate rash, facial/tongue/throat swelling, SOB or lightheadedness with hypotension: Unknown Has patient had a PCN reaction causing severe rash involving mucus membranes or skin necrosis: Unknown Has patient had a PCN reaction that required hospitalization: Unknown Has patient had a PCN reaction occurring within the last 10 years: No If all of the above answers are "NO", then may proceed with Cephalosporin use.     Medications: Current Outpatient Medications  Medication Sig Dispense Refill   amphetamine-dextroamphetamine (ADDERALL) 20 MG tablet Take 1 tablet by mouth 2 (two) times daily.     Aspirin-Salicylamide-Caffeine (BC HEADACHE POWDER PO) Take 1 packet by mouth daily as needed (for headaches.).     omeprazole (PRILOSEC) 20 MG capsule Take 20 mg by mouth 2 (two) times daily before a meal.     No current facility-administered medications for this visit.    Review of Systems: GENERAL: negative for malaise, night sweats HEENT: No changes in hearing or vision, no nose bleeds or other nasal problems. NECK: Negative for lumps, goiter, pain and significant neck swelling RESPIRATORY: Negative for cough, wheezing CARDIOVASCULAR: Negative for chest pain, leg swelling, palpitations, orthopnea GI: SEE HPI MUSCULOSKELETAL: Negative for joint pain or swelling, back pain, and muscle pain. SKIN: Negative for lesions, rash PSYCH: Negative for sleep disturbance, mood disorder and recent psychosocial stressors. HEMATOLOGY Negative for prolonged bleeding, bruising easily, and  swollen nodes. ENDOCRINE: Negative for cold or heat intolerance, polyuria, polydipsia and goiter. NEURO: negative for tremor, gait imbalance, syncope and seizures. The remainder of the review of systems is noncontributory.   Physical Exam: BP (!) 144/90 (BP Location: Left Arm, Patient Position: Sitting, Cuff Size: Large)   Pulse 81   Temp 98.1 F (36.7 C) (Oral)   Ht 5\' 4"  (1.626 m)   Wt 156 lb 11.2 oz (71.1 kg)   BMI 26.90 kg/m  GENERAL: The patient is AO x3, in no acute distress. HEENT: Head is normocephalic and atraumatic. EOMI are intact. Mouth is well hydrated and without lesions. NECK: Supple. No masses LUNGS: Clear to auscultation. No presence of rhonchi/wheezing/rales. Adequate chest expansion CHEST: No pain upon palpation of the retrosternal area. HEART: RRR, normal s1 and s2. ABDOMEN: Soft, nontender, no guarding, no peritoneal signs, and nondistended. BS +. No masses. EXTREMITIES: Without any cyanosis, clubbing, rash, lesions or edema. NEUROLOGIC: AOx3, no focal motor deficit. SKIN: no jaundice, no rashes   Imaging/Labs: as above  I personally reviewed and interpreted the available labs, imaging and endoscopic files.  Impression and Plan: John Jones is a 50 y.o. male with past medical history of hypertension, migraines, OSA, ADHD, urolithiasis who presents for evaluation of chest pain and heartburn.  The patient has presented heartburn and pain in his chest which have not improved with twice daily PPI.  He describes the pain as heartburn and denies any abdominal symptoms.  I consider that this point it is important to investigate this further with an EGD given his respiratory response to PPI.  I discussed with him that it is possible his symptoms are related to GERD and they are not typical anginal symptoms.  However, given his poor partial to PPI it is important to rule out any organic conditions.  I will increase his omeprazole to 40 mg every 12 hours and he was  advised to avoid smoking.  He will also be important to decrease the intake of Goody powders as this will also increase his risk for gastrointestinal injuries.  The patient understood and agreed.  Finally, he is due for colorectal cancer screening, will schedule colonoscopy at the same time of his esophagogastroduodenospy.  - Increase omeprazole to 40 mg every 12 hours - Explained presumed etiology of reflux symptoms. Instruction provided in the use of antireflux medication - patient should take medication in the morning 30-45 minutes before eating breakfast. Discussed avoidance of eating within 2 hours of lying down to sleep and benefit of blocks to elevate head of bed. Also, will benefit from avoiding carbonated drinks/sodas or food that has tomatoes, spicy or greasy food. - Patient was counseled regarding the importance of stopping cigarette smoking. The patient was informed about the long term effects of smoking, progression of current disease. -Stop using NSAIDs such as Aleve, ibuprofen, naproxen, Motrin, Voltaren or Advil (even the topical ones) -Schedule EGD and colonoscopy  All questions were answered.      John Peppers, MD Gastroenterology and Hepatology [facility]

## 2021-04-19 NOTE — Patient Instructions (Signed)
Your procedure is scheduled on: 04/24/2021  Report to [place] Entrance at   6:15  AM.  Call this number if you have problems the morning of surgery: [phone]   Remember:              Follow Directions on the letter you received from Your Physician's office regarding the Bowel Prep              No Smoking the day of Procedure :   Take these medicines the morning of surgery with A SIP OF WATER: omeprazole   Do not wear jewelry, make-up or nail polish.    Do not bring valuables to the hospital.  Contacts, dentures or bridgework may not be worn into surgery.  .   Patients discharged the day of surgery will not be allowed to drive home.     Colonoscopy, Adult, Care After This sheet gives you information about how to care for yourself after your procedure. Your health care provider may also give you more specific instructions. If you have problems or questions, contact your health care provider. What can I expect after the procedure? After the procedure, it is common to have: A small amount of blood in your stool for 24 hours after the procedure. Some gas. Mild abdominal cramping or bloating.  Follow these instructions at home: General instructions  For the first 24 hours after the procedure: Do not drive or use machinery. Do not sign important documents. Do not drink alcohol. Do your regular daily activities at a slower pace than normal. Eat soft, easy-to-digest foods. Rest often. Take over-the-counter or prescription medicines only as told by your health care provider. It is up to you to get the results of your procedure. Ask your health care provider, or the department performing the procedure, when your results will be ready. Relieving cramping and bloating Try walking around when you have cramps or feel bloated. Apply heat to your abdomen as told by your health care provider. Use a heat source that your health care provider recommends, such as a moist heat  pack or a heating pad. Place a towel between your skin and the heat source. Leave the heat on for 20-30 minutes. Remove the heat if your skin turns bright red. This is especially important if you are unable to feel pain, heat, or cold. You may have a greater risk of getting burned. Eating and drinking Drink enough fluid to keep your urine clear or pale yellow. Resume your normal diet as instructed by your health care provider. Avoid heavy or fried foods that are hard to digest. Avoid drinking alcohol for as long as instructed by your health care provider. Contact a health care provider if: You have blood in your stool 2-3 days after the procedure. Get help right away if: You have more than a small spotting of blood in your stool. You pass large blood clots in your stool. Your abdomen is swollen. You have nausea or vomiting. You have a fever. You have increasing abdominal pain that is not relieved with medicine. This information is not intended to replace advice given to you by your health care provider. Make sure you discuss any questions you have with your health care provider. Document Released: 01/02/2004 Document Revised: 02/12/2016 Document Reviewed: 08/01/2015 Elsevier Interactive Patient Education  2018 [facility], Adult, Care After This sheet gives you information about how to care for yourself after your procedure. Your health care provider may also  give you more specific instructions. If you have problems or questions, contact your health care provider. What can I expect after the procedure? After the procedure, it is common to have: A sore throat. Mild stomach pain or discomfort. Bloating. Nausea. Follow these instructions at home:  Follow instructions from your health care provider about what to eat or drink after your procedure. Return to your normal activities as told by your health care provider. Ask your health care provider what activities are safe  for you. Take over-the-counter and prescription medicines only as told by your health care provider. If you were given a sedative during the procedure, it can affect you for several hours. Do not drive or operate machinery until your health care provider says that it is safe. Keep all follow-up visits as told by your health care provider. This is important. Contact a health care provider if you have: A sore throat that lasts longer than one day. Trouble swallowing. Get help right away if: You vomit blood or your vomit looks like coffee grounds. You have: A fever. Bloody, black, or tarry stools. A severe sore throat or you cannot swallow. Difficulty breathing. Severe pain in your chest or abdomen. Summary After the procedure, it is common to have a sore throat, mild stomach discomfort, bloating, and nausea. If you were given a sedative during the procedure, it can affect you for several hours. Do not drive or operate machinery until your health care provider says that it is safe. Follow instructions from your health care provider about what to eat or drink after your procedure. Return to your normal activities as told by your health care provider. This information is not intended to replace advice given to you by your health care provider. Make sure you discuss any questions you have with your health care provider. Document Revised: 03/26/2019 Document Reviewed: 10/20/2017 Elsevier Patient Education  2022 [employer].

## 2021-04-20 ENCOUNTER — Encounter (HOSPITAL_COMMUNITY): Payer: Self-pay

## 2021-04-20 ENCOUNTER — Encounter (HOSPITAL_COMMUNITY)
Admission: RE | Admit: 2021-04-20 | Discharge: 2021-04-20 | Disposition: A | Discharge status: Home / Self Care | Payer: 59 | Source: Ambulatory Visit | Attending: Gastroenterology | Admitting: Gastroenterology

## 2021-04-20 DIAGNOSIS — Z01810 Encounter for preprocedural cardiovascular examination: Secondary | ICD-10-CM | POA: Insufficient documentation

## 2021-04-24 ENCOUNTER — Ambulatory Visit (HOSPITAL_COMMUNITY): Payer: 59 | Admitting: Anesthesiology

## 2021-04-24 ENCOUNTER — Ambulatory Visit (HOSPITAL_COMMUNITY)
Admission: RE | Admit: 2021-04-24 | Discharge: 2021-04-24 | Disposition: A | Discharge status: Home / Self Care | Payer: 59 | Attending: Gastroenterology | Admitting: Gastroenterology

## 2021-04-24 ENCOUNTER — Encounter (HOSPITAL_COMMUNITY)
Admission: RE | Disposition: A | Discharge status: Home / Self Care | Payer: Self-pay | Source: Home / Self Care | Attending: Gastroenterology

## 2021-04-24 DIAGNOSIS — I10 Essential (primary) hypertension: Secondary | ICD-10-CM | POA: Insufficient documentation

## 2021-04-24 DIAGNOSIS — K219 Gastro-esophageal reflux disease without esophagitis: Secondary | ICD-10-CM

## 2021-04-24 DIAGNOSIS — K648 Other hemorrhoids: Secondary | ICD-10-CM | POA: Diagnosis not present

## 2021-04-24 DIAGNOSIS — K449 Diaphragmatic hernia without obstruction or gangrene: Secondary | ICD-10-CM | POA: Insufficient documentation

## 2021-04-24 DIAGNOSIS — Z1211 Encounter for screening for malignant neoplasm of colon: Secondary | ICD-10-CM | POA: Diagnosis not present

## 2021-04-24 DIAGNOSIS — Z87442 Personal history of urinary calculi: Secondary | ICD-10-CM | POA: Insufficient documentation

## 2021-04-24 DIAGNOSIS — D122 Benign neoplasm of ascending colon: Secondary | ICD-10-CM | POA: Diagnosis not present

## 2021-04-24 DIAGNOSIS — Z87898 Personal history of other specified conditions: Secondary | ICD-10-CM | POA: Insufficient documentation

## 2021-04-24 DIAGNOSIS — F17200 Nicotine dependence, unspecified, uncomplicated: Secondary | ICD-10-CM | POA: Diagnosis not present

## 2021-04-24 HISTORY — PX: ESOPHAGOGASTRODUODENOSCOPY (EGD) WITH PROPOFOL: SHX5813

## 2021-04-24 HISTORY — PX: COLONOSCOPY WITH PROPOFOL: SHX5780

## 2021-04-24 HISTORY — PX: POLYPECTOMY: SHX5525

## 2021-04-24 LAB — HM COLONOSCOPY

## 2021-04-24 SURGERY — COLONOSCOPY WITH PROPOFOL
Anesthesia: General

## 2021-04-24 MED ORDER — EPHEDRINE 5 MG/ML INJ
INTRAVENOUS | Status: AC
  Filled 2021-04-24: qty 5

## 2021-04-24 MED ORDER — LIDOCAINE HCL (PF) 2 % IJ SOLN
INTRAMUSCULAR | Status: AC
  Filled 2021-04-24: qty 25

## 2021-04-24 MED ORDER — PROPOFOL 500 MG/50ML IV EMUL
INTRAVENOUS | Status: AC
  Filled 2021-04-24: qty 200

## 2021-04-24 MED ORDER — LACTATED RINGERS IV SOLN: INTRAVENOUS | Status: DC

## 2021-04-24 MED ORDER — PROPOFOL 10 MG/ML IV BOLUS
INTRAVENOUS | Status: DC | PRN
  Administered 2021-04-24 (×2): 20 mg via INTRAVENOUS
  Administered 2021-04-24: 40 mg via INTRAVENOUS
  Administered 2021-04-24 (×2): 20 mg via INTRAVENOUS
  Administered 2021-04-24: 40 mg via INTRAVENOUS
  Administered 2021-04-24: 20 mg via INTRAVENOUS
  Administered 2021-04-24: 80 mg via INTRAVENOUS
  Administered 2021-04-24: 10 mg via INTRAVENOUS
  Administered 2021-04-24: 30 mg via INTRAVENOUS
  Administered 2021-04-24: 40 mg via INTRAVENOUS

## 2021-04-24 MED ORDER — EPHEDRINE 5 MG/ML INJ
INTRAVENOUS | Status: AC
  Filled 2021-04-24: qty 20

## 2021-04-24 MED ORDER — PHENYLEPHRINE HCL-NACL 20-0.9 MG/250ML-% IV SOLN
INTRAVENOUS | Status: AC
  Filled 2021-04-24: qty 250

## 2021-04-24 MED ORDER — PROPOFOL 10 MG/ML IV BOLUS
INTRAVENOUS | Status: AC
  Filled 2021-04-24: qty 20

## 2021-04-24 MED ORDER — PROPOFOL 1000 MG/100ML IV EMUL
INTRAVENOUS | Status: AC
  Filled 2021-04-24: qty 100

## 2021-04-24 NOTE — Transfer of Care (Signed)
Immediate Anesthesia Transfer of Care Note  Patient: John Jones  Procedure(s) Performed: COLONOSCOPY WITH PROPOFOL ESOPHAGOGASTRODUODENOSCOPY (EGD) WITH PROPOFOL POLYPECTOMY  Patient Location: PACU  Anesthesia Type:General  Level of Consciousness: awake, alert , oriented and patient cooperative  Airway & Oxygen Therapy: Patient Spontanous Breathing and Patient connected to nasal cannula oxygen  Post-op Assessment: Report given to RN, Post -op Vital signs reviewed and stable and Patient moving all extremities X 4  Post vital signs: Reviewed and stable  Last Vitals:  Vitals Value Taken Time  BP 138/72   Temp    Pulse 88   Resp 20   SpO2 100     Last Pain:  Vitals:   04/24/21 0737  PainSc: 7          Complications: No notable events documented.

## 2021-04-24 NOTE — Anesthesia Preprocedure Evaluation (Signed)
Anesthesia Evaluation  Patient identified by MRN, date of birth, ID band Patient awake    Reviewed: Allergy & Precautions, H&P , NPO status , Patient's Chart, lab work & pertinent test results  Airway Mallampati: II  TM Distance: >3 FB Neck ROM: Full    Dental  (+) Dental Advisory Given, [place] :   Pulmonary sleep apnea , Current Smoker and Patient abstained from smoking.,    Pulmonary exam normal breath sounds clear to auscultation       Cardiovascular hypertension (not on meds), Normal cardiovascular exam Rhythm:Regular Rate:Normal     Neuro/Psych  Headaches, negative psych ROS   GI/Hepatic Neg liver ROS, GERD  Medicated,  Endo/Other  negative endocrine ROS  Renal/GU Renal disease (stones)  negative genitourinary   Musculoskeletal negative musculoskeletal ROS (+)   Abdominal   Peds negative pediatric ROS (+) ADHD Hematology negative hematology ROS (+)   Anesthesia Other Findings Back pain  Reproductive/Obstetrics negative OB ROS                             Anesthesia Physical Anesthesia Plan  ASA: 2  Anesthesia Plan: General   Post-op Pain Management: Minimal or no pain anticipated   Induction: Intravenous  PONV Risk Score and Plan: TIVA  Airway Management Planned: Nasal Cannula and Natural Airway  Additional Equipment:   Intra-op Plan:   Post-operative Plan:   Informed Consent: I have reviewed the patients History and Physical, chart, labs and discussed the procedure including the risks, benefits and alternatives for the proposed anesthesia with the patient or authorized representative who has indicated his/her understanding and acceptance.     Dental advisory given  Plan Discussed with: CRNA and Surgeon  Anesthesia Plan Comments:         Anesthesia Quick Evaluation

## 2021-04-24 NOTE — Interval H&P Note (Signed)
History and Physical Interval Note:  04/24/2021 7:29 AM John Jones is a 50 y.o. male with past medical history of hypertension, migraines, OSA, ADHD, urolithiasis who presents for evaluation of chest pain and heartburn, and CRC screening.  BP (!) 139/97 (BP Location: Right Arm)   Pulse 94   Temp 98.2 F (36.8 C)   Resp 10   SpO2 98%  GENERAL: The patient is AO x3, in no acute distress. HEENT: Head is normocephalic and atraumatic. EOMI are intact. Mouth is well hydrated and without lesions. NECK: Supple. No masses LUNGS: Clear to auscultation. No presence of rhonchi/wheezing/rales. Adequate chest expansion HEART: RRR, normal s1 and s2. ABDOMEN: Soft, nontender, no guarding, no peritoneal signs, and nondistended. BS +. No masses. EXTREMITIES: Without any cyanosis, clubbing, rash, lesions or edema. NEUROLOGIC: AOx3, no focal motor deficit. SKIN: no jaundice, no rashes  [name]  has presented today for surgery, with the diagnosis of Screening Colonoscpy and Heartburn.  The various methods of treatment have been discussed with the patient and family. After consideration of risks, benefits and other options for treatment, the patient has consented to  Procedure(s) with comments: COLONOSCOPY WITH PROPOFOL (N/A) - 7:30 ESOPHAGOGASTRODUODENOSCOPY (EGD) WITH PROPOFOL (N/A) as a surgical intervention.  The patient's history has been reviewed, patient examined, no change in status, stable for surgery.  I have reviewed the patient's chart and labs.  Questions were answered to the patient's satisfaction.     [name]

## 2021-04-24 NOTE — Op Note (Signed)
[facility] Patient Name: John Jones Procedure Date: 04/24/2021 7:47 AM MRN: [id number] Date of Birth: [birth year] Attending MD: [name] ,  CSN: [id number] Age: 50 Admit Type: Outpatient Procedure:                Colonoscopy Indications:              Screening for colorectal malignant neoplasm Providers:                [name], Lambert Mody, Thomas Hoff., Technician Referring MD:              Medicines:                Monitored Anesthesia Care Complications:            No immediate complications. Estimated Blood Loss:     Estimated blood loss: none. Procedure:                Pre-Anesthesia Assessment:                           - Prior to the procedure, a History and Physical                            was performed, and patient medications, allergies                            and sensitivities were reviewed. The patient's                            tolerance of previous anesthesia was reviewed.                           - The risks and benefits of the procedure and the                            sedation options and risks were discussed with the                            patient. All questions were answered and informed                            consent was obtained.                           - ASA Grade Assessment: II - A patient with mild                            systemic disease.                           After obtaining informed consent, the colonoscope                            was passed under direct vision. Throughout the  procedure, the patient's blood pressure, pulse, and                            oxygen saturations were monitored continuously. The                            [id number]-HQ190L ([id number]) scope was introduced through                            the anus and advanced to the the cecum, identified                            by appendiceal orifice and ileocecal valve. The                             colonoscopy was performed without difficulty. The                            patient tolerated the procedure well. The quality                            of the bowel preparation was adequate. Scope In: 7:52:34 AM Scope Out: 8:15:15 AM Scope Withdrawal Time: 0 hours 18 minutes 57 seconds  Total Procedure Duration: 0 hours 22 minutes 41 seconds  Findings:      The perianal and digital rectal examinations were normal.      A 4 mm polyp was found in the ascending colon. The polyp was sessile.       The polyp was removed with a cold snare. Resection and retrieval were       complete.      Non-bleeding internal hemorrhoids were found during retroflexion. The       hemorrhoids were small. Impression:               - One 4 mm polyp in the ascending colon, removed                            with a cold snare. Resected and retrieved.                           - Non-bleeding internal hemorrhoids. Moderate Sedation:      Per Anesthesia Care Recommendation:           - Discharge patient to home (ambulatory).                           - Resume previous diet.                           - Await pathology results.                           - Repeat colonoscopy for surveillance based on                            pathology results. Procedure Code(s):        ---  Professional ---                           [phone], Colonoscopy, flexible; with removal of                            tumor(s), polyp(s), or other lesion(s) by snare                            technique Diagnosis Code(s):        --- Professional ---                           Z12.11, Encounter for screening for malignant                            neoplasm of colon                           K63.5, Polyp of colon                           K64.8, Other hemorrhoids CPT copyright 2019 American Medical Association. All rights reserved. The codes documented in this report are preliminary and upon coder review may  be  revised to meet current compliance requirements. [name], MD [name],  04/24/2021 8:21:22 AM This report has been signed electronically. Number of Addenda: 0

## 2021-04-24 NOTE — Anesthesia Procedure Notes (Signed)
Procedure Name: MAC Date/Time: 04/24/2021 7:39 AM Performed by: Annamary Carolin, CRNA Pre-anesthesia Checklist: Patient identified, Emergency Drugs available, Suction available and Patient being monitored Patient Re-evaluated:Patient Re-evaluated prior to induction Oxygen Delivery Method: Nasal cannula Preoxygenation: Pre-oxygenation with 100% oxygen Induction Type: IV induction Placement Confirmation: positive ETCO2 Dental Injury: Teeth and Oropharynx as per pre-operative assessment

## 2021-04-24 NOTE — Op Note (Signed)
[facility] Patient Name: John Jones Procedure Date: 04/24/2021 7:20 AM MRN: [id number] Date of Birth: 05-15-1971 Attending MD: [name] ,  CSN: [id number] Age: 50 Admit Type: Outpatient Procedure:                Upper GI endoscopy Indications:              Gastro-esophageal reflux disease Providers:                [name], Lambert Mody Raphael Gibney,                            Technician Referring MD:              Medicines:                Monitored Anesthesia Care Complications:            No immediate complications. Estimated Blood Loss:     Estimated blood loss: none. Procedure:                Pre-Anesthesia Assessment:                           - Prior to the procedure, a History and Physical                            was performed, and patient medications, allergies                            and sensitivities were reviewed. The patient's                            tolerance of previous anesthesia was reviewed.                           - The risks and benefits of the procedure and the                            sedation options and risks were discussed with the                            patient. All questions were answered and informed                            consent was obtained.                           - ASA Grade Assessment: II - A patient with mild                            systemic disease.                           After obtaining informed consent, the endoscope was                            passed under direct vision. Throughout the  procedure, the patient's blood pressure, pulse, and                            oxygen saturations were monitored continuously. The                            GIF-H190 ([id number]) scope was introduced through the                            mouth, and advanced to the second part of duodenum.                            The upper GI endoscopy was accomplished without                             difficulty. The patient tolerated the procedure                            well. Scope In: 7:39:29 AM Scope Out: 7:45:13 AM Total Procedure Duration: 0 hours 5 minutes 44 seconds  Findings:      A 1 cm hiatal hernia was present.      The exam of the esophagus was otherwise normal.      The entire examined stomach was normal.      The examined duodenum was normal. Impression:               - 1 cm hiatal hernia.                           - Normal stomach.                           - Normal examined duodenum.                           - No specimens collected. Moderate Sedation:      Per Anesthesia Care Recommendation:           - Discharge patient to home (ambulatory).                           - Resume previous diet.                           - Continue present medications omeprazole 40 mg                            twice a day.                           - Follow up in GI clinic in one year, will discuss                            switching to famotidine at night and stopping night                            dose  of omeprazole. Procedure Code(s):        --- Professional ---                           [phone], Esophagogastroduodenoscopy, flexible,                            transoral; diagnostic, including collection of                            specimen(s) by brushing or washing, when performed                            (separate procedure) Diagnosis Code(s):        --- Professional ---                           K44.9, Diaphragmatic hernia without obstruction or                            gangrene                           K21.9, Gastro-esophageal reflux disease without                            esophagitis CPT copyright 2019 American Medical Association. All rights reserved. The codes documented in this report are preliminary and upon coder review may  be revised to meet current compliance requirements. [name], MD [name],  04/24/2021 8:16:17  AM This report has been signed electronically. Number of Addenda: 0

## 2021-04-24 NOTE — Anesthesia Postprocedure Evaluation (Signed)
Anesthesia Post Note  Patient: John Jones  Procedure(s) Performed: COLONOSCOPY WITH PROPOFOL ESOPHAGOGASTRODUODENOSCOPY (EGD) WITH PROPOFOL POLYPECTOMY  Patient location during evaluation: Phase II Anesthesia Type: General Level of consciousness: awake and alert and oriented Pain management: pain level controlled Vital Signs Assessment: post-procedure vital signs reviewed and stable Respiratory status: spontaneous breathing and respiratory function stable Cardiovascular status: blood pressure returned to baseline and stable Postop Assessment: no apparent nausea or vomiting Anesthetic complications: no   No notable events documented.   Last Vitals:  Vitals:   04/24/21 0645 04/24/21 0822  BP: (!) 139/97 138/76  Pulse: 94 78  Resp: 10 16  Temp: 36.8 C 36.4 C  SpO2: 98% 99%    Last Pain:  Vitals:   04/24/21 0822  TempSrc: Oral  PainSc: 0-No pain                 Edrees Valent C Jesseca Marsch

## 2021-04-24 NOTE — Discharge Instructions (Addendum)
You are being discharged to home.  Resume your previous diet.  Continue your present medications, Continue present medications omeprazole 40 mg twice a day.  Follow up in GI clinic in one year, will discuss switching to famotidine at night and stopping night dose of omeprazole.ht dose of omeprazole. We are waiting for your pathology results.  Your physician has recommended a repeat colonoscopy for surveillance based on pathology results.

## 2021-04-25 ENCOUNTER — Encounter (HOSPITAL_COMMUNITY): Payer: Self-pay | Admitting: Gastroenterology

## 2021-04-25 LAB — SURGICAL PATHOLOGY

## 2021-04-30 ENCOUNTER — Encounter (INDEPENDENT_AMBULATORY_CARE_PROVIDER_SITE_OTHER): Payer: Self-pay | Admitting: *Deleted

## 2021-05-10 ENCOUNTER — Telehealth (INDEPENDENT_AMBULATORY_CARE_PROVIDER_SITE_OTHER): Payer: Self-pay

## 2021-05-10 NOTE — Telephone Encounter (Signed)
Patient called today stating he recently had a TCS and EGD on 04/24/2021. He states the Omeprazole 40 mg bid was helping for a while, but on Tuesday the chest pain is back and woke him from his sleep. He states his pain is more so when sitting or lying it helps to stand up. He is taking the ppi 30-45 minutes prior to breakfast and supper. He states he has had an EKG in the past when I asked if he had had a cardiac work up. Please advise.

## 2021-05-10 NOTE — Telephone Encounter (Signed)
Spoke to the patient today about his symptoms, which he described as persistent chest pain despite taking PPI BID compliantly. Will refer to [facility] for pH impedance testing and esophageal manometry on PPI. Ann please send the referral,  Thanks

## 2021-05-10 NOTE — Telephone Encounter (Signed)
Referral sent to [facility], they will contact patient with apt

## 2021-05-29 DIAGNOSIS — T819XXA Unspecified complication of procedure, initial encounter: Secondary | ICD-10-CM | POA: Insufficient documentation

## 2021-06-11 ENCOUNTER — Telehealth (INDEPENDENT_AMBULATORY_CARE_PROVIDER_SITE_OTHER): Payer: Self-pay | Admitting: *Deleted

## 2021-06-11 NOTE — Telephone Encounter (Signed)
Spoke to [place] today in regards to referral faxed on 05/10/21 for [place] study, she called patient today he told her he wasn't interested in having this test done that he was feeling better

## 2021-06-11 NOTE — Telephone Encounter (Signed)
Ok thanks 

## 2021-07-13 ENCOUNTER — Ambulatory Visit: Payer: Self-pay | Admitting: Orthopedic Surgery

## 2021-07-13 DIAGNOSIS — Z01818 Encounter for other preprocedural examination: Secondary | ICD-10-CM

## 2021-07-26 ENCOUNTER — Ambulatory Visit (INDEPENDENT_AMBULATORY_CARE_PROVIDER_SITE_OTHER): Payer: 59 | Admitting: Gastroenterology

## 2021-07-27 ENCOUNTER — Other Ambulatory Visit: Payer: Self-pay

## 2021-07-27 DIAGNOSIS — G8929 Other chronic pain: Secondary | ICD-10-CM | POA: Insufficient documentation

## 2021-08-01 ENCOUNTER — Other Ambulatory Visit: Payer: Self-pay

## 2021-08-01 ENCOUNTER — Encounter (HOSPITAL_COMMUNITY): Payer: Self-pay | Admitting: Orthopedic Surgery

## 2021-08-01 NOTE — H&P (Addendum)
? ? ? ?Chief Complaint: ?Low back pain with radicular leg pain. ? ?History: ?Approximately 2-3 years ago he had a L5-S1 discectomy  and states this procedure never gave him any significant relief. Since surgery he has seen Dr. Rory Percy for pain management and had epidural steroid injections, nerve root ablations and tried various medications including gabapentin and Lyrica. He has also done formalized physical therapy needs to do self directed exercise on a regular basis. Despite this he is having significant pain interfering with his quality-of-life.  ? ?We have discussed fusion surgery which I think is the best option he has to help alleviate his pain and improve his quality of life. I told him the primary goal of surgery is reduction not elimination of pain. ? ?Surgical plan is an ALIF L5-S1 with posterior pedicle screw supplemented fixation with iliac crest bone graft. ? ?Past Medical History:  ?Diagnosis Date  ? History of kidney stones   ? multiple episodes, passed stones  ? Hypertension   ? Migraine   ? Right ureteral stone   ? Sleep apnea   ? does not use cpap, lost weight 45lbs  ? ? ?Allergies  ?Allergen Reactions  ? Penicillins Other (See Comments)  ?  Unknown-childhood ?Has patient had a PCN reaction causing immediate rash, facial/tongue/throat swelling, SOB or lightheadedness with hypotension: Unknown ?Has patient had a PCN reaction causing severe rash involving mucus membranes or skin necrosis: Unknown ?Has patient had a PCN reaction that required hospitalization: Unknown ?Has patient had a PCN reaction occurring within the last 10 years: No ?If all of the above answers are "NO", then may proceed with Cephalosporin use. ?  ? ? ?No current facility-administered medications on file prior to encounter.  ? ?Current Outpatient Medications on File Prior to Encounter  ?Medication Sig Dispense Refill  ? ibuprofen (ADVIL) 200 MG tablet Take 200 mg by mouth 2 (two) times daily as needed (for pain.).    ? omeprazole  (PRILOSEC) 40 MG capsule Take 1 capsule (40 mg total) by mouth every 12 (twelve) hours. (Patient not taking: Reported on 08/01/2021) 180 capsule 0  ? ? ?Physical Exam: ?Body mass index is 26.61 kg/m?. ?General: AAOX3, well developed and well nourished, NAD ?Ambulation: antalgic gait pattern, uses no assistive device. ?Inspection: No obvious deformity ?Palpation: Tender over spinous processes and paraspinal muscles, right side especially ?AROM: ?- Significant pain with range of motion of lumbar spine ?- Knee: flexion and extension normal and pain free bilaterally. ?- Ankle: Dorsiflexion, plantarflexion, inversion, eversion normal and pain free.  ?Dermatomes: Lower extremity sensation to light touch abnormal With patient describing pain and dysesthesias down the right leg consistent with L5 dermatome pattern ?Myotomes:  ?- 5/5 lower show any motor strength bilaterally. No obvious motor deficits ?Reflexes:  ?- Patella: Left2+, Right 2+ ?- Achilles: Left2+, Right 2+ ?- Babinski: Left Ngative, Right Negative ?- Clonus: Negative ?Special Tests: ?- Straight Leg Raise: Left Negative, Right Positive ? ?PV: Extremities warm and well profused. Posterior and dorsalis pedis pulse 2+ bilaterally, No pitting Edema ? ?Image: ?X-Ray impression: AP, lateral, spot lumbar films were taken at today's office visit and images were personally reviewed by me. No significant scoliosis. Normal sagittal alignment, no significant slip. No compression fractures seen. Mild degenerative diisc disease at L5-S1. ? ?Lumbar MRI: completed on 05/23/21:  Previous L5 hemilaminotomy is noted.  No recurrent disc herniation is noted. There is degenerative disc disease with severe disc osteophyte complex causing moderate to severe left neuroforaminal narrowing and moderate right  neuroforaminal narrowing. Moderate degenerative changes at L4-5. No fracture or evidence of infection noted. ? ?A/P: ?Diagnosis: Harrie Jeans returns today with ongoing severe back buttock  and episodic neuropathic leg pain. During 2022 he has had formalized physical therapy and he is now doing a self-directed exercise program, he has had multiple epidural injections as well as a dorsal rhizotomy and unfortunately his quality of life continues to deteriorate. He had a previous discectomy at L5-S1 and unfortunately he continues to have significant pain. ? ?At this point time we have discussed the potential for a fusion surgery which I think is the best option he has to help alleviate his pain and improve his quality of life. I told him the primary goal of surgery is reduction not elimination of pain. We have discussed the risks and benefits of surgery and I told him there is a 70% chance of improvement and a 30% chance of no improvement or worsening of his pain. He and his wife's expressed an understanding of the risks as well as willingness to move forward with surgery. ? ?Risks and benefits of surgery were discussed with the patient. These include: Infection, bleeding, death, stroke, paralysis, ongoing or worse pain, need for additional surgery, nonunion, leak of spinal fluid, adjacent segment degeneration requiring additional fusion surgery, need for posterior decompression and/or fusion. Bleeding from major vessels, and blood clots (deep venous thrombosis)requiring additional treatment, loss in bowel and bladder control, injury to bladder, ureters, and other major abdominal organs. ? ?Risks and benefits of spinal fusion: Infection, bleeding, death, stroke, paralysis, ongoing or worse pain, need for additional surgery, nonunion, leak of spinal fluid, adjacent segment degeneration requiring additional fusion surgery, Injury to abdominal vessels that can require anterior surgery to stop bleeding. Malposition of the cage and/or pedicle screws that could require additional surgery. Loss of bowel and bladder control. Postoperative hematoma causing neurologic compression that could require urgent or  emergent re-operation. ? ? ? ?  ?

## 2021-08-01 NOTE — Progress Notes (Signed)
DUE TO COVID-19 ONLY ONE VISITOR IS ALLOWED TO COME WITH YOU AND STAY IN THE WAITING ROOM ONLY DURING PRE OP AND PROCEDURE DAY OF SURGERY.  ? ?Two VISITORS MAY VISIT WITH YOU AFTER SURGERY IN YOUR PRIVATE ROOM DURING VISITING HOURS ONLY! ? ?PCP - Dr Wende Neighbors ?Cardiologist - n/a ? ?Chest x-ray - n/a ?EKG - 04/20/21 ?Stress Test - n/a ?ECHO - n/a ?Cardiac Cath - n/a ? ?ICD Pacemaker/Loop - n/a ? ?Sleep Study -  Yes  ?CPAP - does not use CPAP, lost weight 45lbs ? ?STOP now taking any Aspirin (unless otherwise instructed by your surgeon), Aleve, Naproxen, Ibuprofen, Motrin, Advil, Goody's, BC's, all herbal medications, fish oil, and all vitamins.  ? ?Coronavirus Screening ?Covid test is scheduled on DOS. ?Do you have any of the following symptoms:  ?Cough yes/no: No ?Fever (>100.30F)  yes/no: No ?Runny nose yes/no: No ?Sore throat yes/no: No ?Difficulty breathing/shortness of breath  yes/no: No ? ?Have you traveled in the last 14 days and where? yes/no: No ? ?Patient verbalized understanding of instructions that were given via phone. ?

## 2021-08-01 NOTE — Anesthesia Preprocedure Evaluation (Addendum)
Anesthesia Evaluation  ?Patient identified by MRN, date of birth, ID band ?Patient awake ? ? ? ?Reviewed: ?Allergy & Precautions, NPO status , Patient's Chart, lab work & pertinent test results ? ?Airway ?Mallampati: II ? ?TM Distance: >3 FB ?Neck ROM: Full ? ? ? Dental ?no notable dental hx. ?(+) Teeth Intact, Dental Advisory Given ?  ?Pulmonary ?sleep apnea , Current SmokerPatient did not abstain from smoking.,  ?  ?Pulmonary exam normal ?breath sounds clear to auscultation ? ? ? ? ? ? Cardiovascular ?hypertension, Normal cardiovascular exam ?Rhythm:Regular Rate:Normal ? ? ?  ?Neuro/Psych ? Headaches, Depression Dementia   ? GI/Hepatic ?negative GI ROS, Neg liver ROS,   ?Endo/Other  ? ? Renal/GU ?Lab Results ?     Component                Value               Date                 ?     CREATININE               0.98                08/11/2018           ?     BUN                      15                  08/11/2018           ?     NA                       136                 08/11/2018           ?     K                        5.3 (H)             08/11/2018           ?     CL                       107                 08/11/2018           ?     CO2                      23                  08/11/2018           ?  ? ?  ?Musculoskeletal ? ?(+) Arthritis ,  ? Abdominal ?  ?Peds ? Hematology ?Lab Results ?     Component                Value               Date                 ?     WBC  14.5 (H)            08/02/2021           ?     HGB                      17.2 (H)            08/02/2021           ?     HCT                      52.0                08/02/2021           ?     MCV                      95.8                08/02/2021           ?     PLT                      280                 08/02/2021           ?   ?Anesthesia Other Findings ? ? Reproductive/Obstetrics ? ?  ? ? ? ? ? ? ? ? ? ? ? ? ? ?  ?  ? ? ? ? ? ? ?Anesthesia Physical ?Anesthesia Plan ? ?ASA:  2 ? ?Anesthesia Plan: General  ? ?Post-op Pain Management: Regional block* and Dilaudid IV  ? ?Induction: Inhalational ? ?PONV Risk Score and Plan: 2 and Treatment may vary due to age or medical condition, Midazolam and Ondansetron ? ?Airway Management Planned: Oral ETT ? ?Additional Equipment: None ? ?Intra-op Plan:  ? ?Post-operative Plan: Extubation in OR ? ?Informed Consent:  ? ? ? ?Dental advisory given ? ?Plan Discussed with: CRNA and Anesthesiologist ? ?Anesthesia Plan Comments: (L TAP block plus GA)  ? ? ? ? ? ? ?Anesthesia Quick Evaluation ? ?

## 2021-08-02 ENCOUNTER — Inpatient Hospital Stay (HOSPITAL_COMMUNITY): Payer: 59

## 2021-08-02 ENCOUNTER — Other Ambulatory Visit: Payer: Self-pay

## 2021-08-02 ENCOUNTER — Inpatient Hospital Stay (HOSPITAL_COMMUNITY): Payer: 59 | Admitting: Anesthesiology

## 2021-08-02 ENCOUNTER — Encounter (HOSPITAL_COMMUNITY): Payer: Self-pay | Admitting: Orthopedic Surgery

## 2021-08-02 ENCOUNTER — Inpatient Hospital Stay (HOSPITAL_COMMUNITY)
Admission: RE | Admit: 2021-08-02 | Discharge: 2021-08-04 | DRG: 460 | Disposition: A | Discharge status: Home / Self Care | Payer: 59 | Attending: Orthopedic Surgery | Admitting: Orthopedic Surgery

## 2021-08-02 ENCOUNTER — Encounter (HOSPITAL_COMMUNITY)
Admission: RE | Disposition: A | Discharge status: Home / Self Care | Payer: Self-pay | Source: Home / Self Care | Attending: Orthopedic Surgery

## 2021-08-02 DIAGNOSIS — M5137 Other intervertebral disc degeneration, lumbosacral region: Principal | ICD-10-CM | POA: Diagnosis present

## 2021-08-02 DIAGNOSIS — Z79899 Other long term (current) drug therapy: Secondary | ICD-10-CM | POA: Diagnosis not present

## 2021-08-02 DIAGNOSIS — Z880 Allergy status to penicillin: Secondary | ICD-10-CM

## 2021-08-02 DIAGNOSIS — Z825 Family history of asthma and other chronic lower respiratory diseases: Secondary | ICD-10-CM

## 2021-08-02 DIAGNOSIS — Z419 Encounter for procedure for purposes other than remedying health state, unspecified: Secondary | ICD-10-CM

## 2021-08-02 DIAGNOSIS — F17210 Nicotine dependence, cigarettes, uncomplicated: Secondary | ICD-10-CM | POA: Diagnosis present

## 2021-08-02 DIAGNOSIS — I10 Essential (primary) hypertension: Secondary | ICD-10-CM | POA: Diagnosis present

## 2021-08-02 DIAGNOSIS — M5116 Intervertebral disc disorders with radiculopathy, lumbar region: Secondary | ICD-10-CM | POA: Diagnosis present

## 2021-08-02 DIAGNOSIS — Z8249 Family history of ischemic heart disease and other diseases of the circulatory system: Secondary | ICD-10-CM

## 2021-08-02 DIAGNOSIS — M961 Postlaminectomy syndrome, not elsewhere classified: Secondary | ICD-10-CM | POA: Diagnosis present

## 2021-08-02 DIAGNOSIS — G629 Polyneuropathy, unspecified: Secondary | ICD-10-CM | POA: Diagnosis present

## 2021-08-02 DIAGNOSIS — G8929 Other chronic pain: Secondary | ICD-10-CM | POA: Diagnosis present

## 2021-08-02 DIAGNOSIS — Z87442 Personal history of urinary calculi: Secondary | ICD-10-CM

## 2021-08-02 DIAGNOSIS — M5417 Radiculopathy, lumbosacral region: Secondary | ICD-10-CM | POA: Diagnosis not present

## 2021-08-02 DIAGNOSIS — M545 Low back pain, unspecified: Secondary | ICD-10-CM | POA: Diagnosis not present

## 2021-08-02 DIAGNOSIS — Z20822 Contact with and (suspected) exposure to covid-19: Secondary | ICD-10-CM | POA: Diagnosis present

## 2021-08-02 DIAGNOSIS — G4733 Obstructive sleep apnea (adult) (pediatric): Secondary | ICD-10-CM | POA: Diagnosis present

## 2021-08-02 DIAGNOSIS — Z981 Arthrodesis status: Secondary | ICD-10-CM

## 2021-08-02 DIAGNOSIS — Z801 Family history of malignant neoplasm of trachea, bronchus and lung: Secondary | ICD-10-CM

## 2021-08-02 DIAGNOSIS — Z8261 Family history of arthritis: Secondary | ICD-10-CM | POA: Diagnosis not present

## 2021-08-02 DIAGNOSIS — Z01818 Encounter for other preprocedural examination: Secondary | ICD-10-CM

## 2021-08-02 DIAGNOSIS — Z01810 Encounter for preprocedural cardiovascular examination: Secondary | ICD-10-CM | POA: Diagnosis not present

## 2021-08-02 HISTORY — PX: ABDOMINAL EXPOSURE: SHX5708

## 2021-08-02 HISTORY — PX: ANTERIOR AND POSTERIOR SPINAL FUSION: SHX2259

## 2021-08-02 HISTORY — PX: HARVEST BONE GRAFT: SHX377

## 2021-08-02 LAB — CBC
HCT: 52 % (ref 39.0–52.0)
Hemoglobin: 17.2 g/dL — ABNORMAL HIGH (ref 13.0–17.0)
MCH: 31.7 pg (ref 26.0–34.0)
MCHC: 33.1 g/dL (ref 30.0–36.0)
MCV: 95.8 fL (ref 80.0–100.0)
Platelets: 280 10*3/uL (ref 150–400)
RBC: 5.43 MIL/uL (ref 4.22–5.81)
RDW: 13.4 % (ref 11.5–15.5)
WBC: 14.5 10*3/uL — ABNORMAL HIGH (ref 4.0–10.5)
nRBC: 0 % (ref 0.0–0.2)

## 2021-08-02 LAB — URINALYSIS, ROUTINE W REFLEX MICROSCOPIC
Bacteria, UA: NONE SEEN
Bilirubin Urine: NEGATIVE
Glucose, UA: NEGATIVE mg/dL
Ketones, ur: NEGATIVE mg/dL
Leukocytes,Ua: NEGATIVE
Nitrite: NEGATIVE
Protein, ur: NEGATIVE mg/dL
Specific Gravity, Urine: 1.016 (ref 1.005–1.030)
pH: 5 (ref 5.0–8.0)

## 2021-08-02 LAB — BASIC METABOLIC PANEL
Anion gap: 12 (ref 5–15)
BUN: 14 mg/dL (ref 6–20)
CO2: 19 mmol/L — ABNORMAL LOW (ref 22–32)
Calcium: 8.7 mg/dL — ABNORMAL LOW (ref 8.9–10.3)
Chloride: 109 mmol/L (ref 98–111)
Creatinine, Ser: 0.97 mg/dL (ref 0.61–1.24)
GFR, Estimated: >60 mL/min (ref >60)
Glucose, Bld: 96 mg/dL (ref 70–99)
Potassium: 3.7 mmol/L (ref 3.5–5.1)
Sodium: 140 mmol/L (ref 135–145)

## 2021-08-02 LAB — SURGICAL PCR SCREEN
MRSA, PCR: NEGATIVE
Staphylococcus aureus: POSITIVE — AB

## 2021-08-02 LAB — TYPE AND SCREEN
ABO/RH(D): B POS
Antibody Screen: NEGATIVE

## 2021-08-02 LAB — ABO/RH: ABO/RH(D): B POS

## 2021-08-02 LAB — SARS CORONAVIRUS 2 BY RT PCR (HOSPITAL ORDER, PERFORMED IN ~~LOC~~ HOSPITAL LAB): SARS Coronavirus 2: NEGATIVE

## 2021-08-02 SURGERY — ANTERIOR AND POSTERIOR SPINAL FUSION
Anesthesia: General | Site: Spine Lumbar

## 2021-08-02 MED ORDER — ROCURONIUM BROMIDE 10 MG/ML (PF) SYRINGE
PREFILLED_SYRINGE | INTRAVENOUS | Status: AC
  Filled 2021-08-02: qty 10

## 2021-08-02 MED ORDER — HYDROMORPHONE HCL 1 MG/ML IJ SOLN
1.0000 mg | INTRAMUSCULAR | Status: AC | PRN
  Administered 2021-08-02 (×3): 1 mg via INTRAVENOUS
  Filled 2021-08-02 (×3): qty 1

## 2021-08-02 MED ORDER — POLYETHYLENE GLYCOL 3350 17 G PO PACK
17.0000 g | PACK | Freq: Every day | ORAL | Status: DC | PRN
  Administered 2021-08-02: 17 g via ORAL
  Filled 2021-08-02: qty 1

## 2021-08-02 MED ORDER — 0.9 % SODIUM CHLORIDE (POUR BTL) OPTIME
TOPICAL | Status: DC | PRN
Start: 2021-08-02 — End: 2021-08-02
  Administered 2021-08-02 (×2): 1000 mL

## 2021-08-02 MED ORDER — FENTANYL CITRATE (PF) 250 MCG/5ML IJ SOLN
INTRAMUSCULAR | Status: DC | PRN
  Administered 2021-08-02: 20 ug via INTRAVENOUS
  Administered 2021-08-02: 10 ug via INTRAVENOUS
  Administered 2021-08-02: 50 ug via INTRAVENOUS
  Administered 2021-08-02: 10 ug via INTRAVENOUS
  Administered 2021-08-02 (×2): 50 ug via INTRAVENOUS
  Administered 2021-08-02 (×3): 20 ug via INTRAVENOUS

## 2021-08-02 MED ORDER — LACTATED RINGERS IV SOLN: INTRAVENOUS | Status: DC | PRN

## 2021-08-02 MED ORDER — SODIUM CHLORIDE 0.9 % IV SOLN: 250.0000 mL | INTRAVENOUS | Status: DC

## 2021-08-02 MED ORDER — DEXAMETHASONE SODIUM PHOSPHATE 10 MG/ML IJ SOLN
INTRAMUSCULAR | Status: DC | PRN
  Administered 2021-08-02: 10 mg via INTRAVENOUS

## 2021-08-02 MED ORDER — OXYCODONE HCL 5 MG PO TABS
10.0000 mg | ORAL_TABLET | ORAL | Status: DC | PRN
  Administered 2021-08-02 – 2021-08-04 (×12): 10 mg via ORAL
  Filled 2021-08-02 (×12): qty 2

## 2021-08-02 MED ORDER — CHLORHEXIDINE GLUCONATE 0.12 % MT SOLN
15.0000 mL | Freq: Once | OROMUCOSAL | Status: AC
  Administered 2021-08-02: 15 mL via OROMUCOSAL
  Filled 2021-08-02: qty 15

## 2021-08-02 MED ORDER — SUGAMMADEX SODIUM 500 MG/5ML IV SOLN
INTRAVENOUS | Status: AC
  Filled 2021-08-02: qty 5

## 2021-08-02 MED ORDER — OXYCODONE-ACETAMINOPHEN 10-325 MG PO TABS: 1.0000 | ORAL_TABLET | Freq: Four times a day (QID) | ORAL | 0 refills | Status: AC | PRN

## 2021-08-02 MED ORDER — ARTIFICIAL TEARS OPHTHALMIC OINT
TOPICAL_OINTMENT | OPHTHALMIC | Status: AC
  Filled 2021-08-02: qty 3.5

## 2021-08-02 MED ORDER — LACTATED RINGERS IV SOLN: INTRAVENOUS | Status: DC

## 2021-08-02 MED ORDER — CHLORHEXIDINE GLUCONATE CLOTH 2 % EX PADS: 6.0000 | MEDICATED_PAD | Freq: Once | CUTANEOUS | Status: DC

## 2021-08-02 MED ORDER — ACETAMINOPHEN 650 MG RE SUPP: 650.0000 mg | RECTAL | Status: DC | PRN

## 2021-08-02 MED ORDER — OXYCODONE HCL 5 MG PO TABS: 5.0000 mg | ORAL_TABLET | ORAL | Status: DC | PRN

## 2021-08-02 MED ORDER — DEXMEDETOMIDINE (PRECEDEX) IN NS 20 MCG/5ML (4 MCG/ML) IV SYRINGE
PREFILLED_SYRINGE | INTRAVENOUS | Status: DC | PRN
  Administered 2021-08-02: 8 ug via INTRAVENOUS
  Administered 2021-08-02: 4 ug via INTRAVENOUS

## 2021-08-02 MED ORDER — METHOCARBAMOL 500 MG PO TABS
500.0000 mg | ORAL_TABLET | Freq: Four times a day (QID) | ORAL | Status: DC | PRN
  Administered 2021-08-02 – 2021-08-04 (×6): 500 mg via ORAL
  Filled 2021-08-02 (×7): qty 1

## 2021-08-02 MED ORDER — DEXAMETHASONE SODIUM PHOSPHATE 10 MG/ML IJ SOLN
INTRAMUSCULAR | Status: AC
  Filled 2021-08-02: qty 1

## 2021-08-02 MED ORDER — ACETAMINOPHEN 10 MG/ML IV SOLN
INTRAVENOUS | Status: DC | PRN
  Administered 2021-08-02: 1000 mg via INTRAVENOUS

## 2021-08-02 MED ORDER — ONDANSETRON HCL 4 MG/2ML IJ SOLN
INTRAMUSCULAR | Status: DC | PRN
Start: 2021-08-02 — End: 2021-08-02
  Administered 2021-08-02: 4 mg via INTRAVENOUS

## 2021-08-02 MED ORDER — PHENYLEPHRINE 40 MCG/ML (10ML) SYRINGE FOR IV PUSH (FOR BLOOD PRESSURE SUPPORT)
PREFILLED_SYRINGE | INTRAVENOUS | Status: DC | PRN
  Administered 2021-08-02: 80 ug via INTRAVENOUS
  Administered 2021-08-02 (×2): 120 ug via INTRAVENOUS

## 2021-08-02 MED ORDER — OXYCODONE HCL 5 MG PO TABS: 5.0000 mg | ORAL_TABLET | Freq: Once | ORAL | Status: DC | PRN

## 2021-08-02 MED ORDER — HYDROMORPHONE HCL 1 MG/ML IJ SOLN
INTRAMUSCULAR | Status: DC | PRN
  Administered 2021-08-02 (×2): .5 mg via INTRAVENOUS

## 2021-08-02 MED ORDER — SUGAMMADEX SODIUM 200 MG/2ML IV SOLN
INTRAVENOUS | Status: DC | PRN
  Administered 2021-08-02: 137 mg via INTRAVENOUS

## 2021-08-02 MED ORDER — OXYCODONE HCL 5 MG/5ML PO SOLN: 5.0000 mg | Freq: Once | ORAL | Status: DC | PRN

## 2021-08-02 MED ORDER — BUPIVACAINE HCL (PF) 0.5 % IJ SOLN
INTRAMUSCULAR | Status: DC | PRN
  Administered 2021-08-02: 20 mL

## 2021-08-02 MED ORDER — ONDANSETRON HCL 4 MG/2ML IJ SOLN: 4.0000 mg | Freq: Once | INTRAMUSCULAR | Status: DC | PRN

## 2021-08-02 MED ORDER — ARTIFICIAL TEARS OPHTHALMIC OINT
TOPICAL_OINTMENT | OPHTHALMIC | Status: DC | PRN
  Administered 2021-08-02: 1 via OPHTHALMIC

## 2021-08-02 MED ORDER — VANCOMYCIN HCL 750 MG/150ML IV SOLN
750.0000 mg | Freq: Once | INTRAVENOUS | Status: AC
  Administered 2021-08-02: 750 mg via INTRAVENOUS
  Filled 2021-08-02: qty 150

## 2021-08-02 MED ORDER — KETOROLAC TROMETHAMINE 30 MG/ML IJ SOLN: 30.0000 mg | Freq: Once | INTRAMUSCULAR | Status: DC | PRN

## 2021-08-02 MED ORDER — LIDOCAINE 2% (20 MG/ML) 5 ML SYRINGE
INTRAMUSCULAR | Status: DC | PRN
  Administered 2021-08-02: 100 mg via INTRAVENOUS

## 2021-08-02 MED ORDER — BUPIVACAINE-EPINEPHRINE 0.25% -1:200000 IJ SOLN
INTRAMUSCULAR | Status: DC | PRN
  Administered 2021-08-02: 10 mL
  Administered 2021-08-02: 20 mL

## 2021-08-02 MED ORDER — SODIUM CHLORIDE 0.9% FLUSH
3.0000 mL | Freq: Two times a day (BID) | INTRAVENOUS | Status: DC
  Administered 2021-08-02 – 2021-08-03 (×3): 3 mL via INTRAVENOUS

## 2021-08-02 MED ORDER — KETAMINE HCL 50 MG/5ML IJ SOSY
PREFILLED_SYRINGE | INTRAMUSCULAR | Status: AC
  Filled 2021-08-02: qty 5

## 2021-08-02 MED ORDER — METHOCARBAMOL 500 MG PO TABS: 500.0000 mg | ORAL_TABLET | Freq: Three times a day (TID) | ORAL | 0 refills | Status: AC | PRN

## 2021-08-02 MED ORDER — HYDROMORPHONE HCL 1 MG/ML IJ SOLN
INTRAMUSCULAR | Status: AC
  Filled 2021-08-02: qty 0.5

## 2021-08-02 MED ORDER — PHENOL 1.4 % MT LIQD: 1.0000 | OROMUCOSAL | Status: DC | PRN

## 2021-08-02 MED ORDER — ONDANSETRON HCL 4 MG PO TABS: 4.0000 mg | ORAL_TABLET | Freq: Four times a day (QID) | ORAL | Status: DC | PRN

## 2021-08-02 MED ORDER — ONDANSETRON HCL 4 MG/2ML IJ SOLN
INTRAMUSCULAR | Status: AC
  Filled 2021-08-02: qty 2

## 2021-08-02 MED ORDER — MENTHOL 3 MG MT LOZG: 1.0000 | LOZENGE | OROMUCOSAL | Status: DC | PRN

## 2021-08-02 MED ORDER — THROMBIN 20000 UNITS EX SOLR
CUTANEOUS | Status: DC | PRN
  Administered 2021-08-02: 20000 [IU] via TOPICAL

## 2021-08-02 MED ORDER — ORAL CARE MOUTH RINSE: 15.0000 mL | Freq: Once | OROMUCOSAL | Status: AC

## 2021-08-02 MED ORDER — PHENYLEPHRINE HCL-NACL 20-0.9 MG/250ML-% IV SOLN
INTRAVENOUS | Status: DC | PRN
  Administered 2021-08-02: 25 ug/min via INTRAVENOUS

## 2021-08-02 MED ORDER — PROPOFOL 500 MG/50ML IV EMUL
INTRAVENOUS | Status: DC | PRN
  Administered 2021-08-02: 50 ug/kg/min via INTRAVENOUS

## 2021-08-02 MED ORDER — PROPOFOL 10 MG/ML IV BOLUS
INTRAVENOUS | Status: AC
  Filled 2021-08-02: qty 20

## 2021-08-02 MED ORDER — ONDANSETRON HCL 4 MG PO TABS: 4.0000 mg | ORAL_TABLET | Freq: Three times a day (TID) | ORAL | 0 refills | Status: DC | PRN

## 2021-08-02 MED ORDER — HYDROMORPHONE HCL 1 MG/ML IJ SOLN: 0.2500 mg | INTRAMUSCULAR | Status: DC | PRN

## 2021-08-02 MED ORDER — FENTANYL CITRATE (PF) 250 MCG/5ML IJ SOLN
INTRAMUSCULAR | Status: AC
  Filled 2021-08-02: qty 5

## 2021-08-02 MED ORDER — FLEET ENEMA 7-19 GM/118ML RE ENEM
1.0000 | ENEMA | Freq: Once | RECTAL | Status: AC | PRN
  Administered 2021-08-04: 1 via RECTAL
  Filled 2021-08-02: qty 1

## 2021-08-02 MED ORDER — DEXAMETHASONE 4 MG PO TABS
4.0000 mg | ORAL_TABLET | Freq: Four times a day (QID) | ORAL | Status: DC
  Administered 2021-08-02 – 2021-08-04 (×5): 4 mg via ORAL
  Filled 2021-08-02 (×5): qty 1

## 2021-08-02 MED ORDER — ACETAMINOPHEN 10 MG/ML IV SOLN
INTRAVENOUS | Status: AC
  Filled 2021-08-02: qty 100

## 2021-08-02 MED ORDER — MIDAZOLAM HCL 5 MG/5ML IJ SOLN
INTRAMUSCULAR | Status: DC | PRN
  Administered 2021-08-02: 2 mg via INTRAVENOUS

## 2021-08-02 MED ORDER — SURGIFLO WITH THROMBIN (HEMOSTATIC MATRIX KIT) OPTIME
TOPICAL | Status: DC | PRN
  Administered 2021-08-02 (×3): 1 via TOPICAL

## 2021-08-02 MED ORDER — SODIUM CHLORIDE 0.9 % IV SOLN
INTRAVENOUS | Status: DC | PRN
Start: 2021-08-02 — End: 2021-08-02

## 2021-08-02 MED ORDER — BUPIVACAINE LIPOSOME 1.3 % IJ SUSP
INTRAMUSCULAR | Status: DC | PRN
  Administered 2021-08-02: 10 mL

## 2021-08-02 MED ORDER — BUPIVACAINE-EPINEPHRINE (PF) 0.25% -1:200000 IJ SOLN
INTRAMUSCULAR | Status: AC
  Filled 2021-08-02: qty 30

## 2021-08-02 MED ORDER — LIDOCAINE 2% (20 MG/ML) 5 ML SYRINGE
INTRAMUSCULAR | Status: AC
  Filled 2021-08-02: qty 5

## 2021-08-02 MED ORDER — THROMBIN 20000 UNITS EX SOLR
CUTANEOUS | Status: AC
  Filled 2021-08-02: qty 20000

## 2021-08-02 MED ORDER — AMPHETAMINE-DEXTROAMPHETAMINE 10 MG PO TABS
30.0000 mg | ORAL_TABLET | Freq: Two times a day (BID) | ORAL | Status: DC
  Administered 2021-08-02 – 2021-08-04 (×4): 30 mg via ORAL
  Filled 2021-08-02 (×4): qty 3

## 2021-08-02 MED ORDER — ROCURONIUM BROMIDE 10 MG/ML (PF) SYRINGE
PREFILLED_SYRINGE | INTRAVENOUS | Status: DC | PRN
  Administered 2021-08-02: 60 mg via INTRAVENOUS
  Administered 2021-08-02 (×2): 20 mg via INTRAVENOUS

## 2021-08-02 MED ORDER — MIDAZOLAM HCL 2 MG/2ML IJ SOLN
INTRAMUSCULAR | Status: AC
  Filled 2021-08-02: qty 2

## 2021-08-02 MED ORDER — METHOCARBAMOL 1000 MG/10ML IJ SOLN
500.0000 mg | Freq: Four times a day (QID) | INTRAVENOUS | Status: DC | PRN
  Filled 2021-08-02: qty 5

## 2021-08-02 MED ORDER — PROPOFOL 10 MG/ML IV BOLUS
INTRAVENOUS | Status: DC | PRN
  Administered 2021-08-02: 50 mg via INTRAVENOUS
  Administered 2021-08-02: 160 mg via INTRAVENOUS
  Administered 2021-08-02: 50 mg via INTRAVENOUS

## 2021-08-02 MED ORDER — ONDANSETRON HCL 4 MG/2ML IJ SOLN: 4.0000 mg | Freq: Four times a day (QID) | INTRAMUSCULAR | Status: DC | PRN

## 2021-08-02 MED ORDER — DEXAMETHASONE SODIUM PHOSPHATE 4 MG/ML IJ SOLN
4.0000 mg | Freq: Four times a day (QID) | INTRAMUSCULAR | Status: DC
  Administered 2021-08-02 – 2021-08-03 (×2): 4 mg via INTRAVENOUS
  Filled 2021-08-02 (×2): qty 1

## 2021-08-02 MED ORDER — ACETAMINOPHEN 325 MG PO TABS
650.0000 mg | ORAL_TABLET | ORAL | Status: DC | PRN
  Administered 2021-08-02 – 2021-08-04 (×4): 650 mg via ORAL
  Filled 2021-08-02 (×4): qty 2

## 2021-08-02 MED ORDER — PHENYLEPHRINE 40 MCG/ML (10ML) SYRINGE FOR IV PUSH (FOR BLOOD PRESSURE SUPPORT)
PREFILLED_SYRINGE | INTRAVENOUS | Status: AC
  Filled 2021-08-02: qty 10

## 2021-08-02 MED ORDER — VANCOMYCIN HCL IN DEXTROSE 1-5 GM/200ML-% IV SOLN
1000.0000 mg | INTRAVENOUS | Status: AC
  Administered 2021-08-02: 1000 mg via INTRAVENOUS
  Filled 2021-08-02: qty 200

## 2021-08-02 MED ORDER — ENOXAPARIN SODIUM 40 MG/0.4ML IJ SOSY: 40.0000 mg | PREFILLED_SYRINGE | INTRAMUSCULAR | 0 refills | Status: DC

## 2021-08-02 MED ORDER — SODIUM CHLORIDE 0.9% FLUSH: 3.0000 mL | INTRAVENOUS | Status: DC | PRN

## 2021-08-02 MED ORDER — ENOXAPARIN SODIUM 40 MG/0.4ML IJ SOSY: 40.0000 mg | PREFILLED_SYRINGE | INTRAMUSCULAR | Status: DC

## 2021-08-02 MED ORDER — KETAMINE HCL 10 MG/ML IJ SOLN
INTRAMUSCULAR | Status: DC | PRN
  Administered 2021-08-02: 10 mg via INTRAVENOUS
  Administered 2021-08-02: 20 mg via INTRAVENOUS
  Administered 2021-08-02 (×2): 10 mg via INTRAVENOUS

## 2021-08-02 SURGICAL SUPPLY — 107 items
ANCHOR LUMBAR 25 MIS (Anchor) ×3 IMPLANT
APPLIER CLIP 11 MED OPEN (CLIP) ×8
BAG COUNTER SPONGE SURGICOUNT (BAG) ×8 IMPLANT
BLADE CLIPPER SURG (BLADE) ×1 IMPLANT
BLADE SURG 10 STRL SS (BLADE) ×7 IMPLANT
CLIP APPLIE 11 MED OPEN (CLIP) ×6 IMPLANT
CLIP LIGATING EXTRA MED SLVR (CLIP) ×4 IMPLANT
CLIP PULSE STIMULATION (NEUROSURGERY SUPPLIES) ×1 IMPLANT
CLSR STERI-STRIP ANTIMIC 1/2X4 (GAUZE/BANDAGES/DRESSINGS) ×2 IMPLANT
CNTNR URN SCR LID CUP LEK RST (MISCELLANEOUS) IMPLANT
CONT SPEC 4OZ STRL OR WHT (MISCELLANEOUS) ×4
CORD BIPOLAR FORCEPS 12FT (ELECTRODE) ×4 IMPLANT
COVER SURGICAL LIGHT HANDLE (MISCELLANEOUS) ×9 IMPLANT
DRAIN CHANNEL 15F RND FF W/TCR (WOUND CARE) IMPLANT
DRAPE C-ARM 42X72 X-RAY (DRAPES) ×8 IMPLANT
DRAPE C-ARMOR (DRAPES) ×8 IMPLANT
DRAPE INCISE IOBAN 66X45 STRL (DRAPES) ×5 IMPLANT
DRAPE LAPAROTOMY T 102X78X121 (DRAPES) ×5 IMPLANT
DRAPE SURG 17X23 STRL (DRAPES) ×6 IMPLANT
DRAPE U-SHAPE 47X51 STRL (DRAPES) ×10 IMPLANT
DRSG AQUACEL AG ADV 3.5X 6 (GAUZE/BANDAGES/DRESSINGS) ×6 IMPLANT
DRSG OPSITE POSTOP 4X6 (GAUZE/BANDAGES/DRESSINGS) ×1 IMPLANT
DRSG OPSITE POSTOP 4X8 (GAUZE/BANDAGES/DRESSINGS) ×1 IMPLANT
DURAPREP 26ML APPLICATOR (WOUND CARE) ×8 IMPLANT
ELECT BLADE 4.0 EZ CLEAN MEGAD (MISCELLANEOUS) ×4
ELECT CAUTERY BLADE 6.4 (BLADE) ×4 IMPLANT
ELECT PENCIL ROCKER SW 15FT (MISCELLANEOUS) ×8 IMPLANT
ELECT REM PT RETURN 9FT ADLT (ELECTROSURGICAL) ×8
ELECTRODE BLDE 4.0 EZ CLN MEGD (MISCELLANEOUS) ×6 IMPLANT
ELECTRODE REM PT RTRN 9FT ADLT (ELECTROSURGICAL) ×6 IMPLANT
GLOVE OPTIFIT SS 7.5 STRL LX (GLOVE) ×4 IMPLANT
GLOVE SRG 8 PF TXTR STRL LF DI (GLOVE) ×3 IMPLANT
GLOVE SURG ENC MOIS LTX SZ6.5 (GLOVE) ×8 IMPLANT
GLOVE SURG ENC MOIS LTX SZ7.5 (GLOVE) ×4 IMPLANT
GLOVE SURG MICRO LTX SZ7.5 (GLOVE) ×4 IMPLANT
GLOVE SURG MICRO LTX SZ8.5 (GLOVE) ×8 IMPLANT
GLOVE SURG UNDER POLY LF SZ6.5 (GLOVE) ×8 IMPLANT
GLOVE SURG UNDER POLY LF SZ8 (GLOVE) ×4
GLOVE SURG UNDER POLY LF SZ8.5 (GLOVE) ×8 IMPLANT
GOWN STRL REUS W/ TWL LRG LVL3 (GOWN DISPOSABLE) ×6 IMPLANT
GOWN STRL REUS W/ TWL XL LVL3 (GOWN DISPOSABLE) ×3 IMPLANT
GOWN STRL REUS W/TWL 2XL LVL3 (GOWN DISPOSABLE) ×8 IMPLANT
GOWN STRL REUS W/TWL LRG LVL3 (GOWN DISPOSABLE) ×8
GOWN STRL REUS W/TWL XL LVL3 (GOWN DISPOSABLE) ×4
GUIDEWIRE NITINOL BEVEL TIP (WIRE) ×4 IMPLANT
INSERT FOGARTY 61MM (MISCELLANEOUS) IMPLANT
INSERT FOGARTY SM (MISCELLANEOUS) IMPLANT
INST TUBE SUCT GLOBUS 8 (ORTHOPEDIC DISPOSABLE SUPPLIES) ×3
INSTRUMENT TUBE SUCT GLOBUS 8 (ORTHOPEDIC DISPOSABLE SUPPLIES) IMPLANT
KIT BASIN OR (CUSTOM PROCEDURE TRAY) ×4 IMPLANT
KIT HARVESTER BONE 10 DISP (KITS) ×1 IMPLANT
KIT POSITION SURG JACKSON T1 (MISCELLANEOUS) ×4 IMPLANT
KIT PULSE MIOM NDL (NEUROSURGERY SUPPLIES) IMPLANT
KIT PULSE MIOM NEEDLE (NEUROSURGERY SUPPLIES) ×4 IMPLANT
KIT TURNOVER KIT B (KITS) ×4 IMPLANT
MARKER PEN SURG W/LABELS BLK (STERILIZATION PRODUCTS) ×5 IMPLANT
NEEDLE HYPO 22GX1.5 SAFETY (NEEDLE) ×4 IMPLANT
NS IRRIG 1000ML POUR BTL (IV SOLUTION) ×4 IMPLANT
PACK LAMINECTOMY ORTHO (CUSTOM PROCEDURE TRAY) ×4 IMPLANT
PACK UNIVERSAL I (CUSTOM PROCEDURE TRAY) ×8 IMPLANT
PAD ARMBOARD 7.5X6 YLW CONV (MISCELLANEOUS) ×16 IMPLANT
PATTIES SURGICAL .5 X1 (DISPOSABLE) IMPLANT
PROBE PULSE STIMULATION (NEUROSURGERY SUPPLIES) ×1 IMPLANT
PUTTY DBX 1CC (Putty) ×4 IMPLANT
PUTTY DBX 1CC DEPUY (Putty) IMPLANT
ROD RELINE MAS TI LORD 5.5X40 (Rod) ×2 IMPLANT
RUBBER BAND PULSE STRL (MISCELLANEOUS) ×1 IMPLANT
SCREW LOCK RELINE 5.5 TULIP (Screw) ×4 IMPLANT
SCREW RELINE MAS POLY 6.5X40MM (Screw) ×1 IMPLANT
SCREW RELINE RED 6.5X45MM POLY (Screw) ×2 IMPLANT
SCREW RELINE RED POLY 6.5X35MM (Screw) ×1 IMPLANT
SPACER HEDRON IA 29X39X13 8D (Spacer) ×1 IMPLANT
SPONGE INTESTINAL PEANUT (DISPOSABLE) ×11 IMPLANT
SPONGE SURGIFOAM ABS GEL 100 (HEMOSTASIS) ×4 IMPLANT
SPONGE T-LAP 18X18 ~~LOC~~+RFID (SPONGE) ×4 IMPLANT
SPONGE T-LAP 4X18 ~~LOC~~+RFID (SPONGE) ×8 IMPLANT
STRIP CLOSURE SKIN 1/2X4 (GAUZE/BANDAGES/DRESSINGS) ×8 IMPLANT
SURGIFLO W/THROMBIN 8M KIT (HEMOSTASIS) ×2 IMPLANT
SUT MNCRL AB 3-0 PS2 18 (SUTURE) ×3 IMPLANT
SUT MNCRL AB 3-0 PS2 27 (SUTURE) ×8 IMPLANT
SUT PDS AB 1 CTX 36 (SUTURE) ×11 IMPLANT
SUT PROLENE 4 0 RB 1 (SUTURE)
SUT PROLENE 4-0 RB1 .5 CRCL 36 (SUTURE) IMPLANT
SUT PROLENE 5 0 CC1 (SUTURE) IMPLANT
SUT PROLENE 6 0 C 1 30 (SUTURE) ×3 IMPLANT
SUT PROLENE 6 0 CC (SUTURE) IMPLANT
SUT SILK 0 TIES 10X30 (SUTURE) ×3 IMPLANT
SUT SILK 2 0 TIES 10X30 (SUTURE) ×7 IMPLANT
SUT SILK 2 0SH CR/8 30 (SUTURE) IMPLANT
SUT SILK 3 0 TIES 10X30 (SUTURE) ×4 IMPLANT
SUT SILK 3 0 TIES 17X18 (SUTURE)
SUT SILK 3 0SH CR/8 30 (SUTURE) IMPLANT
SUT SILK 3-0 18XBRD TIE BLK (SUTURE) ×3 IMPLANT
SUT VIC AB 0 CT1 27 (SUTURE) ×4
SUT VIC AB 0 CT1 27XBRD ANBCTR (SUTURE) IMPLANT
SUT VIC AB 1 CT1 18XCR BRD 8 (SUTURE) IMPLANT
SUT VIC AB 1 CT1 8-18 (SUTURE) ×8
SUT VIC AB 2-0 CT1 18 (SUTURE) ×9 IMPLANT
SYR BULB IRRIG 60ML STRL (SYRINGE) ×4 IMPLANT
SYR CONTROL 10ML LL (SYRINGE) ×4 IMPLANT
TOWEL GREEN STERILE (TOWEL DISPOSABLE) ×12 IMPLANT
TOWEL GREEN STERILE FF (TOWEL DISPOSABLE) ×4 IMPLANT
TRAY FOLEY W/BAG SLVR 16FR (SET/KITS/TRAYS/PACK) ×4
TRAY FOLEY W/BAG SLVR 16FR ST (SET/KITS/TRAYS/PACK) ×3 IMPLANT
TUBE SUCTION INST 8IN (ORTHOPEDIC DISPOSABLE SUPPLIES) ×1
WATER STERILE IRR 1000ML POUR (IV SOLUTION) ×3 IMPLANT
YANKAUER SUCT BULB TIP NO VENT (SUCTIONS) ×5 IMPLANT

## 2021-08-02 NOTE — Transfer of Care (Signed)
Immediate Anesthesia Transfer of Care Note ? ?Patient: John Jones ? ?Procedure(s) Performed: ANTERIOR LUMBAR INTERBODY FUSION WITH POSTERIOR SPINAL FUSION, INSTRUMENTATION AND ILIAC CREST BONE GRAFT HARVEST LUMBAR FIVE THROUGH SACRAL ONE (Spine Lumbar) ?HARVEST ILIAC BONE GRAFT (Groin) ?ABDOMINAL EXPOSURE FOR ANTERIOR LUMBAR SPINE SURGERY (Abdomen) ? ?Patient Location: PACU ? ?Anesthesia Type:General ? ?Level of Consciousness: drowsy, patient cooperative and responds to stimulation ? ?Airway & Oxygen Therapy: Patient Spontanous Breathing and Patient connected to nasal cannula oxygen ? ?Post-op Assessment: Report given to RN, Post -op Vital signs reviewed and stable, Patient moving all extremities X 4 and Patient able to stick tongue midline ? ?Post vital signs: Reviewed ? ?Last Vitals:  ?Vitals Value Taken Time  ?BP 121/77   ?Temp 98.0   ?Pulse 109 08/02/21 1336  ?Resp 20 08/02/21 1336  ?SpO2 96 % 08/02/21 1336  ?Vitals shown include unvalidated device data. ? ?Last Pain:  ?Vitals:  ? 08/02/21 0629  ?TempSrc: Oral  ?PainSc:   ?   ? ?Patients Stated Pain Goal: 3 (08/02/21 7078) ? ?Complications: No notable events documented. ?

## 2021-08-02 NOTE — Brief Op Note (Signed)
08/02/2021 ? ?12:57 PM ? ?PATIENT:  John Jones  51 y.o. male ? ?PRE-OPERATIVE DIAGNOSIS:  Degenerative disc disease L5-S1, post laminectomy syndrome ? ?POST-OPERATIVE DIAGNOSIS:  Degenerative disc disease L5-S1, post laminectomy syndrome ? ?PROCEDURE:  Procedure(s): ?ANTERIOR LUMBAR INTERBODY FUSION WITH POSTERIOR SPINAL FUSION, INSTRUMENTATION AND ILIAC CREST BONE GRAFT HARVEST LUMBAR FIVE THROUGH SACRAL ONE (N/A) ?HARVEST ILIAC BONE GRAFT (N/A) ?ABDOMINAL EXPOSURE FOR ANTERIOR LUMBAR SPINE SURGERY (N/A) ? ?SURGEON:  Surgeon(s) and Role: ?Panel 1: ?   Melina Schools, MD - Primary ?Panel 2: ?   Carlis Abbott, Gwenyth Allegra, MD - Primary ?   Virl Cagey, Carolann Littler, MD ? ?PHYSICIAN ASSISTANT:  ? ?ASSISTANTS: Nelson Chimes  ? ?ANESTHESIA:   general ? ?EBL:  275 mL  ? ?BLOOD ADMINISTERED:none ? ?DRAINS: none  ? ?LOCAL MEDICATIONS USED:  MARCAINE    ? ?SPECIMEN:  No Specimen ? ?DISPOSITION OF SPECIMEN:  N/A ? ?COUNTS:  YES ? ?TOURNIQUET:  * No tourniquets in log * ? ?DICTATION: .Dragon Dictation ? ?PLAN OF CARE: Admit to inpatient  ? ?PATIENT DISPOSITION:  PACU - hemodynamically stable. ?  ?

## 2021-08-02 NOTE — Anesthesia Procedure Notes (Addendum)
Anesthesia Regional Block: TAP block  ? ?Pre-Anesthetic Checklist: , timeout performed,  Correct Patient,, Correct Laterality,, ? ?Laterality: Left and Lower ? ?Prep: chloraprep     ?  ?Needles:  ?Injection technique: Single-shot ? ?Needle Type: Echogenic Needle   ? ? ?Needle Length: 4cm  ?Needle Gauge: 20  ? ? ? ?Additional Needles: ? ? ?Procedures: Doppler guided,,,,,,    ?Narrative:  ?Start time: 08/02/2021 7:15 AM ?End time: 08/02/2021 7:25 AM ? ?Performed by: Personally  ?Anesthesiologist: [name], MD ? ? ? ? ?

## 2021-08-02 NOTE — Op Note (Signed)
Date: August 02, 2021 ? ?Preoperative diagnosis: Chronic lower back pain ? ?Postoperative diagnosis: Same ? ?Procedure: Anterior spine exposure at the L5-S1 disc space via anterior retroperitoneal approach for L5-S1 ALIF ? ?Surgeon: Dr. Marty Heck, MD ? ?Co-surgeon: Dr. Melina Schools, MD ? ?Assistant: Dr. Melene Muller, MD and Nelson Chimes, PA ? ?Indications: Patient is a 51 year old male with chronic lower back pain that has failed conservative management.  He has been evaluated by Dr. Rolena Infante who has recommended anterior lumbar body fusion at L5-S1.  Vascular surgery was asked to assist with abdominal exposure.  He presents today after evaluation and discussing risks and benefits. ? ?Findings: Transverse incision over the left rectus muscle at the L5-S1 disc space.  The anterior rectus sheath was opened transversely and the left rectus muscle was circumferentially mobilized and this was moved lateral.  Entered the retroperitoneum and the peritoneum and left ureter were mobilized across midline.  The disc space at L5-S1 was visualized.  The middle sacral vessels were divided between 2-0 silk ties and clips.  The left iliac vein was fully mobilized lateral to the disc space and then we had good working room on both sides of the disc space.  A fixed NuVasive retractor was placed.  We confirmed we were at the correct level on lateral fluoroscopy with a spinal needle in the disc base at L5-S1. ? ?Anesthesia: General ? ?Details: Patient was taken the operating room after informed consent was obtained.  Placed on the operative table in the supine position.  General endotracheal anesthesia was induced.  Fluoroscopic C-arm was then used in the lateral position to mark the L5-S1 disc space over the left rectus muscle.  The abdominal wall was then prepped and draped in standard sterile fashion.  Antibiotics were given.  Timeout was performed.  Initially iliac crest was harvested by Dr. Rolena Infante.  Please see his dictation  for that portion of the case.  I was called to the room once iliac crest was harvested.  Transverse incision was made over the left rectus muscle with scalpel and dissected through the subcutaneous tissue with Bovie cautery.  Cerebellar retractors were used and the anterior rectus sheath was opened transversely.  Hemostats were used to raise flaps underneath the anterior rectus sheath and the rectus muscle was circumferentially mobilized.  I then mobilized the muscle lateral and entered into the retroperitoneum and mobilized peritoneum and left ureter across midline with blunt dissection.  Hand-held Wiley retractors were used to pull the peritoneum and left ureter across midline.  Visualized the left iliac vein and left iliac artery.  Ultimately dissected over the L5-S1 disc space with Kd and suction.  The middle sacral vessels were divided between 2-0 silk ties and clips and divided.  I then used Kd and suction to mobilize on both sides of the disc space including fully mobilizing the left iliac vein.  Once I was satisfied with working room on both sides of the disc space, I then placed fixed NuVasive retractor on the field.  160 reverse lip retractors were placed on both sides of the disc space.  120 reverse lip was placed cranial and a 160 reverse lip was placed caudal.  Spinal needle was placed in the disc space and we confirmed on lateral fluoroscopy that we were at the correct level at L5-S1.  Case was turned over to Dr. Rolena Infante. ? ?Complication: None ? ?Condition: Stable ? ?Marty Heck, MD ?[facility] ?Office: [phone] ? ? ?  Marty Heck ? ?

## 2021-08-02 NOTE — OR Nursing (Signed)
No instrumentation or retained objects seen on x-ray post ALIF per Dr. Posey Pronto, Radiologist. ?

## 2021-08-02 NOTE — Progress Notes (Signed)
Pharmacy Antibiotic Note ? ?John Jones is a 51 y.o. male admitted on 08/02/2021 with lumbar fusion. Pharmacy has been consulted for vancomycin dosing for surgical prophylaxis. No drain in place per RN.  ? ?Plan: ?Vancomycin 750mg  x1  ? ?Height: 5\' 4"  (162.6 cm) ?Weight: 68.5 kg (151 lb) ?IBW/kg (Calculated) : 59.2 ? ?Temp (24hrs), Avg:98.9 ?F (37.2 ?C), Min:98.7 ?F (37.1 ?C), Max:98.9 ?F (37.2 ?C) ? ?Recent Labs  ?Lab 08/02/21 ?9390  ?WBC 14.5*  ?CREATININE 0.97  ?  ?Estimated Creatinine Clearance: 75.4 mL/min (by C-G formula based on SCr of 0.97 mg/dL).   ? ?Allergies  ?Allergen Reactions  ? Penicillins Other (See Comments)  ?  Unknown-childhood ?Has patient had a PCN reaction causing immediate rash, facial/tongue/throat swelling, SOB or lightheadedness with hypotension: Unknown ?Has patient had a PCN reaction causing severe rash involving mucus membranes or skin necrosis: Unknown ?Has patient had a PCN reaction that required hospitalization: Unknown ?Has patient had a PCN reaction occurring within the last 10 years: No ?If all of the above answers are "NO", then may proceed with Cephalosporin use. ?  ? ? ?Antimicrobials this admission: ? ? ?Dose adjustments this admission: ? ? ?Microbiology results: ? ? ?Thank you for allowing pharmacy to be a part of this patient?s care. ? ?John Jones, PharmD, BCPS ?Clinical Pharmacist ?08/02/2021 3:17 PM ? ? ?

## 2021-08-02 NOTE — Discharge Instructions (Signed)
  Spinal Fusion, Adult, Care After This sheet gives you information about how to care for yourself after your procedure. Your doctor may also give you more specific instructions. If you have problems or questions, contact your doctor. Follow these instructions at home: Medicines Take over-the-counter and prescription medicines only as told by your doctor. These include any medicines for pain or blood-thinning medicines (anticoagulants). If you were prescribed an antibiotic medicine, take it as told by your doctor. Do not stop taking the antibiotic even if you start to feel better. Do not drive for 24 hours if you were given a medicine to help you relax (sedative) during your procedure. Do not drive or use heavy machinery while taking prescription pain medicine. If you have a brace: Wear the brace as told by your doctor. Take it off only as told by your doctor. Keep the brace clean. Managing pain, stiffness, and swelling If directed, put ice on the surgery area: If you have a removable brace, take it off as told by your doctor. Put ice in a plastic bag. Place a towel between your skin and the bag. Leave the ice on for 20 minutes, 2-3 times a day. Surgery cut care    Follow instructions from your doctor about how to take care of your cut from surgery (incision). Make sure you: Wash your hands with soap and water before you change your bandage (dressing). If you cannot use soap and water, use hand sanitizer. Change your bandage as told by your doctor. Leave stitches (sutures), skin glue, or skin tape (adhesive) strips in place. They may need to stay in place for 2 weeks or longer. If tape strips get loose and curl up, you may trim the loose edges. Do not remove tape strips completely unless your doctor says it is okay. Keep your cut from surgery clean and dry. Do not take baths, swim, or use a hot tub until your doctor says it is okay. Ask your doctor if you can take showers. You may only  be allowed to take sponge baths. Every day, check your cut from surgery and the area around it for: More redness, swelling, or pain. Fluid or blood. Warmth. Pus or a bad smell. If you have a drain tube, follow instructions from your doctor about caring for it. Do not take out the drain tube or any bandages unless your doctor says it is okay. Physical activity Rest and protect your back as much as possible. Follow instructions from your doctor about how to move. Use good posture to help your spine heal. Do not lift anything that is heavier than 8 lb (3.6 kg), or the limit that you are told, until your doctor says that it is safe. Do not twist or bend at the waist until your doctor says it is okay. It is best if you: Do not make pushing and pulling motions. Do not sit or lie down in the same position for a long time. Do not raise your hands or arms above your head. Return to your normal activities as told by your doctor. Ask your doctor what activities are safe for you. Rest and protect your back as much as you can. Do not start to exercise until your doctor says it is okay. Ask your doctor what kinds of exercise you can do to make your back stronger. Ok to shower in 5 days.  Do not take a bath or submerge the wound General instructions To prevent blood clots and lessen swelling   in your legs: Wear compression stockings as told. Walk one or more times every few hours as told by your doctor. Do not use any products that contain nicotine or tobacco, such as cigarettes and e-cigarettes. These can delay bone healing. If you need help quitting, ask your doctor. To prevent or treat constipation while you are taking prescription pain medicine, your doctor may suggest that you: Drink enough fluid to keep your pee (urine) pale yellow. Take over-the-counter or prescription medicines. Eat foods that are high in fiber. These include fresh fruits and vegetables, whole grains, and beans. Limit foods that  are high in fat and processed sugars, such as fried and sweet foods. Keep all follow-up visits as told by your doctor. This is important. Contact a doctor if: Your pain gets worse. Your medicine does not help your pain. Your legs or feet get painful or swollen. Your cut from surgery is more red, swollen, or painful. Your cut from surgery feels warm to the touch. You have: Fluid or blood coming from your cut from surgery. Pus or a bad smell coming from your cut from surgery. A fever. Weakness or loss of feeling (numbness) in your legs that is new or getting worse. Trouble controlling when you pee (urinate) or poop (have a bowel movement). You feel sick to your stomach (nauseous). You throw up (vomit). Get help right away if: Your pain is very bad. You have chest pain. You have trouble breathing. You start to have a cough. These symptoms may be an emergency. Do not wait to see if the symptoms will go away. Get medical help right away. Call your local emergency services (911 in the [place] Do not drive yourself to the hospital. Summary After the procedure, it is common to have pain in your back and pain by your surgery cut(s). Icing and pain medicines may help to control the pain. Follow directions from your doctor. Rest and protect your back as much as possible. Do not twist or bend at the waist. Get up and walk one or more times every few hours as told by your doctor. This information is not intended to replace advice given to you by your health care provider. Make sure you discuss any questions you have with your health care provider.  -signs and symptoms of a blood clot such as chest pain; shortness of breath; pain, swelling, or warmth in the leg -signs and symptoms of a stroke such as changes in vision; confusion; trouble speaking or understanding; severe headaches; sudden numbness or weakness of the face, arm or leg; trouble walking; dizziness; loss of coordination Side effects that  usually do not require medical attention (report to your doctor or health care professional if they continue or are bothersome): -hair loss -pain, redness, or irritation at site where injected This list may not describe all possible side effects. Call your doctor for medical advice about side effects. You may report side effects to FDA at 1-800-FDA-1088. Where should I keep my medicine? Keep out of the reach of children. Store at room temperature between 15 and 30 degrees C (59 and 86 degrees F). Do not freeze. If your injections have been specially prepared, you may need to store them in the refrigerator. Ask your pharmacist. Throw away any unused medicine after the expiration date. NOTE: This sheet is a summary. It may not cover all possible information. If you have questions about this medicine, talk to your doctor, pharmacist, or health care provider.    Enoxaparin injection  What is this medicine? ENOXAPARIN (ee nox a PA rin) is used after knee, hip, or abdominal surgeries to prevent blood clotting. It is also used to treat existing blood clots in the lungs or in the veins. This medicine may be used for other purposes; ask your health care provider or pharmacist if you have questions. COMMON BRAND NAME(S): Lovenox What should I tell my health care provider before I take this medicine? They need to know if you have any of these conditions: bleeding disorders, hemorrhage, or hemophilia infection of the heart or heart valves kidney or liver disease previous stroke prosthetic heart valve recent surgery or delivery of a baby ulcer in the stomach or intestine, diverticulitis, or other bowel disease an unusual or allergic reaction to enoxaparin, heparin, pork or pork products, other medicines, foods, dyes, or preservatives pregnant or trying to get pregnant breast-feeding How should I use this medicine? This medicine is for injection under the skin. It is usually given by a health-care  professional. You or a family member may be trained on how to give the injections. If you are to give yourself injections, make sure you understand how to use the syringe, measure the dose if necessary, and give the injection. To avoid bruising, do not rub the site where this medicine has been injected. Do not take your medicine more often than directed. Do not stop taking except on the advice of your doctor or health care professional. Make sure you receive a puncture-resistant container to dispose of the needles and syringes once you have finished with them. Do not reuse these items. Return the container to your doctor or health care professional for proper disposal. Talk to your pediatrician regarding the use of this medicine in children. Special care may be needed. Overdosage: If you think you have taken too much of this medicine contact a poison control center or emergency room at once. NOTE: This medicine is only for you. Do not share this medicine with others. What if I miss a dose? If you miss a dose, take it as soon as you can. If it is almost time for your next dose, take only that dose. Do not take double or extra doses. What may interact with this medicine? aspirin and aspirin-like medicines certain medicines that treat or prevent blood clots dipyridamole NSAIDs, medicines for pain and inflammation, like ibuprofen or naproxen This list may not describe all possible interactions. Give your health care provider a list of all the medicines, herbs, non-prescription drugs, or dietary supplements you use. Also tell them if you smoke, drink alcohol, or use illegal drugs. Some items may interact with your medicine. What should I watch for while using this medicine? Visit your healthcare professional for regular checks on your progress. You may need blood work done while you are taking this medicine. Your condition will be monitored carefully while you are receiving this medicine. It is important  not to miss any appointments. If you are going to need surgery or other procedure, tell your healthcare professional that you are using this medicine. Using this medicine for a long time may weaken your bones and increase the risk of bone fractures. Avoid sports and activities that might cause injury while you are using this medicine. Severe falls or injuries can cause unseen bleeding. Be careful when using sharp tools or knives. Consider using an Copy. Take special care brushing or flossing your teeth. Report any injuries, bruising, or red spots on the skin to your healthcare professional.  Wear a medical ID bracelet or chain. Carry a card that describes your disease and details of your medicine and dosage times. What side effects may I notice from receiving this medicine? Side effects that you should report to your doctor or health care professional as soon as possible: allergic reactions like skin rash, itching or hives, swelling of the face, lips, or tongue bone pain signs and symptoms of bleeding such as bloody or black, tarry stools; red or dark-brown urine; spitting up blood or brown material that looks like coffee grounds; red spots on the skin; unusual bruising or bleeding from the eye, gums, or nose signs and symptoms of a blood clot such as chest pain; shortness of breath; pain, swelling, or warmth in the leg signs and symptoms of a stroke such as changes in vision; confusion; trouble speaking or understanding; severe headaches; sudden numbness or weakness of the face, arm or leg; trouble walking; dizziness; loss of coordination Side effects that usually do not require medical attention (report to your doctor or health care professional if they continue or are bothersome): hair loss pain, redness, or irritation at site where injected This list may not describe all possible side effects. Call your doctor for medical advice about side effects. You may report side effects to FDA at  1-800-FDA-1088. Where should I keep my medicine? Keep out of the reach of children. Store at room temperature between 15 and 30 degrees C (59 and 86 degrees F). Do not freeze. If your injections have been specially prepared, you may need to store them in the refrigerator. Ask your pharmacist. Throw away any unused medicine after the expiration date. NOTE: This sheet is a summary. It may not cover all possible information. If you have questions about this medicine, talk to your doctor, pharmacist, or health care provider.

## 2021-08-02 NOTE — Anesthesia Postprocedure Evaluation (Signed)
Anesthesia Post Note ? ?Patient: John Jones ? ?Procedure(s) Performed: ANTERIOR LUMBAR INTERBODY FUSION WITH POSTERIOR SPINAL FUSION, INSTRUMENTATION AND ILIAC CREST BONE GRAFT HARVEST LUMBAR FIVE THROUGH SACRAL ONE (Spine Lumbar) ?HARVEST ILIAC BONE GRAFT (Groin) ?ABDOMINAL EXPOSURE FOR ANTERIOR LUMBAR SPINE SURGERY (Abdomen) ? ?  ? ?Patient location during evaluation: PACU ?Anesthesia Type: General ?Level of consciousness: awake and alert ?Pain management: pain level controlled ?Vital Signs Assessment: post-procedure vital signs reviewed and stable ?Respiratory status: spontaneous breathing, nonlabored ventilation, respiratory function stable and patient connected to nasal cannula oxygen ?Cardiovascular status: blood pressure returned to baseline and stable ?Postop Assessment: no apparent nausea or vomiting ?Anesthetic complications: no ? ? ?No notable events documented. ? ?Last Vitals:  ?Vitals:  ? 08/02/21 1425 08/02/21 1448  ?BP: 121/84 115/86  ?Pulse: (!) 107 (!) 102  ?Resp: 14 18  ?Temp: 37.2 ?C 37.2 ?C  ?SpO2: 95% 98%  ?  ?Last Pain:  ?Vitals:  ? 08/02/21 1448  ?TempSrc: Oral  ?PainSc:   ? ? ?  ?  ?  ?  ?  ?  ? ?[name] ? ? ? ? ?

## 2021-08-02 NOTE — Consult Note (Signed)
Hospital Consult    Reason for Consult: Evaluate for abdominal exposure for L5-S1 ALIF Referring Physician: Dr. Rolena Infante MRN #:  [id number]  History of Present Illness: This is a 51 y.o. male with history of hypertension and obstructive sleep apnea that presents for evaluation of abdominal exposure for L5-S1 ALIF.  The patient has had about 2.5 years of chronic lower back pain.  He has failed conservative management.  He has been evaluate by Dr. Rolena Infante who feels he needs an L5-S1 ALIF and vascular surgery was asked to assist with abdominal exposure.  He has no history of abdominal surgery.  Smokes about a pack a day.  Past Medical History:  Diagnosis Date   History of kidney stones    multiple episodes, passed stones   Hypertension    Migraine    Right ureteral stone    Sleep apnea    does not use cpap, lost weight 45lbs    Past Surgical History:  Procedure Laterality Date   BACK SURGERY     CARPAL TUNNEL RELEASE Right 12/08/2014   Procedure: RIGHT CARPAL TUNNEL RELEASE;  Surgeon: Iran Planas, MD;  Location: [facility];  Service: Orthopedics;  Laterality: Right;   COLONOSCOPY WITH PROPOFOL N/A 04/24/2021   Procedure: COLONOSCOPY WITH PROPOFOL;  Surgeon: Harvel Quale, MD;  Location: [facility];  Service: Gastroenterology;  Laterality: N/A;  7:30   CYSTOSCOPY Right 12/2015   stent placed and removed    CYSTOSCOPY W/ URETERAL STENT PLACEMENT Right 07/25/2016   Procedure: CYSTOSCOPY/RETROGRADE/URETEROSCOPY/STENT PLACEMENT;  Surgeon: Alexis Frock, MD;  [facility];  Service: Urology;  Laterality: Right;   ESOPHAGOGASTRODUODENOSCOPY (EGD) WITH PROPOFOL N/A 04/24/2021   Procedure: ESOPHAGOGASTRODUODENOSCOPY (EGD) WITH PROPOFOL;  Surgeon: Harvel Quale, MD;  Location: [facility];  Service: Gastroenterology;  Laterality: N/A;   INCISION AND DRAINAGE Right 01/19/2016   Procedure: INCISION AND DRAINAGE RIGHT LONG FINGER;  Surgeon: Carole Civil, MD;  Location: [facility];  Service: Orthopedics;  Laterality: Right;  RIGHT LONG FINGER - PT COMING AT 9:00 FOR LABWORK   POLYPECTOMY  04/24/2021   Procedure: POLYPECTOMY;  Surgeon: Harvel Quale, MD;  Location: [facility];  Service: Gastroenterology;;   SHOULDER ARTHROSCOPY WITH OPEN ROTATOR CUFF REPAIR Right 07/03/2017   Procedure: SHOULDER ARTHROSCOPY;  Surgeon: Carole Civil, MD;  Location: [facility];  Service: Orthopedics;  Laterality: Right;   SHOULDER OPEN ROTATOR CUFF REPAIR Right 07/03/2017   Procedure: ROTATOR CUFF REPAIR SHOULDER OPEN;  Surgeon: Carole Civil, MD;  Location: [facility];  Service: Orthopedics;  Laterality: Right;   WRIST ARTHROSCOPY Right 12/08/2014   Procedure: RIGHT WRIST ARTHROSCOPY WITH DEBRIDEMENT/OR REPAIR;  Surgeon: Iran Planas, MD;  Location: [facility];  Service: Orthopedics;  Laterality: Right;    Allergies  Allergen Reactions   Penicillins Other (See Comments)    Unknown-childhood Has patient had a PCN reaction causing immediate rash, facial/tongue/throat swelling, SOB or lightheadedness with hypotension: Unknown Has patient had a PCN reaction causing severe rash involving mucus membranes or skin necrosis: Unknown Has patient had a PCN reaction that required hospitalization: Unknown Has patient had a PCN reaction occurring within the last 10 years: No If all of the above answers are "NO", then may proceed with Cephalosporin use.     Prior to Admission medications   Medication Sig Start Date End Date Taking? Authorizing Provider  amphetamine-dextroamphetamine (ADDERALL) 30 MG tablet Take 30 mg by mouth 2 (two) times daily. 05/22/21  Yes [provider]  ibuprofen (ADVIL) 200 MG tablet Take 200 mg by mouth 2 (two) times daily as needed (for pain.).   Yes [provider]  omeprazole (PRILOSEC) 40 MG capsule Take 1 capsule (40 mg total) by mouth every 12 (twelve) hours. Patient not taking: Reported on 08/01/2021  04/09/21   Harvel Quale, MD    Social History   Socioeconomic History   Marital status: Married    Spouse name: Not on file   Number of children: Not on file   Years of education: Not on file   Highest education level: Not on file  Occupational History   Not on file  Tobacco Use   Smoking status: Every Day    Packs/day: 0.50    Years: 8.00    Pack years: 4.00    Types: Cigarettes   Smokeless tobacco: Never  Vaping Use   Vaping Use: Former   Quit date: 06/04/2019   Substances: Nicotine, Flavoring  Substance and Sexual Activity   Alcohol use: Yes    Comment: occasional beer   Drug use: No   Sexual activity: Yes    Birth control/protection: None  Other Topics Concern   Not on file  Social History Narrative   Not on file   Social Determinants of Health   Financial Resource Strain: Not on file  Food Insecurity: Not on file  Transportation Needs: Not on file  Physical Activity: Not on file  Stress: Not on file  Social Connections: Not on file  Intimate Partner Violence: Not on file     Family History  Problem Relation Age of Onset   Lung cancer Mother    Arthritis Mother    Heart disease Father    Emphysema Father    Bone cancer Father     ROS: [x]  Positive   [ ]  Negative   [ ]  All sytems reviewed and are negative  Cardiovascular: []  chest pain/pressure []  palpitations []  SOB lying flat []  DOE []  pain in legs while walking []  pain in legs at rest []  pain in legs at night []  non-healing ulcers []  hx of DVT []  swelling in legs  Pulmonary: []  productive cough []  asthma/wheezing []  home O2  Neurologic: []  weakness in []  arms []  legs []  numbness in []  arms []  legs []  hx of CVA []  mini stroke [] difficulty speaking or slurred speech []  temporary loss of vision in one eye []  dizziness  Hematologic: []  hx of cancer []  bleeding problems []  problems with blood clotting easily  Endocrine:   []  diabetes []  thyroid disease  GI []   vomiting blood []  blood in stool  GU: []  CKD/renal failure []  HD--[]  M/W/F or []  T/T/S []  burning with urination []  blood in urine  Psychiatric: []  anxiety []  depression  Musculoskeletal: []  arthritis []  joint pain  Integumentary: []  rashes []  ulcers  Constitutional: []  fever []  chills   Physical Examination  Vitals:   08/02/21 0629  BP: (!) 163/97  Pulse: 93  Resp: 18  Temp: 98.7 F (37.1 C)  SpO2: 100%   Body mass index is 25.92 kg/m.  General:  WDWN in NAD Gait: Not observed HENT: WNL, normocephalic Pulmonary: normal non-labored breathing, without Rales, rhonchi,  wheezing Cardiac: regular, without  Murmurs, rubs or gallops Abdomen:  soft, NT/ND, no previous incisions Vascular Exam/Pulses: Bilateral femoral pulses palpable Left DP palpable Extremities: without ischemic changes Musculoskeletal: no muscle wasting or atrophy  Neurologic: A&O X 3; Appropriate Affect ; SENSATION: normal; MOTOR FUNCTION:  moving all extremities  equally. Speech is fluent/normal   CBC    Component Value Date/Time   WBC 14.5 (H) 08/02/2021 0639   RBC 5.43 08/02/2021 0639   HGB 17.2 (H) 08/02/2021 0639   HCT 52.0 08/02/2021 0639   PLT 280 08/02/2021 0639   MCV 95.8 08/02/2021 0639   MCH 31.7 08/02/2021 0639   MCHC 33.1 08/02/2021 0639   RDW 13.4 08/02/2021 0639   LYMPHSABS 2.0 08/11/2018 1357   MONOABS 0.6 08/11/2018 1357   EOSABS 0.1 08/11/2018 1357   BASOSABS 0.0 08/11/2018 1357    BMET    Component Value Date/Time   NA 136 08/11/2018 1357   K 5.3 (H) 08/11/2018 1357   CL 107 08/11/2018 1357   CO2 23 08/11/2018 1357   GLUCOSE 81 08/11/2018 1357   BUN 15 08/11/2018 1357   CREATININE 0.98 08/11/2018 1357   CALCIUM 8.8 (L) 08/11/2018 1357   GFRNONAA >60 08/11/2018 1357   GFRAA >60 08/11/2018 1357    COAGS: Lab Results  Component Value Date   INR 1.02 06/27/2017     Non-Invasive Vascular Imaging:    MRI lumbar spine reviewed from  09/30/2019   ASSESSMENT/PLAN: This is a 51 y.o. male with chronic lower back pain who presents for L5-S1 ALIF.  Vascular surgery was asked to assist with abdominal exposure at the anterior disc base at L5-S1.  I reviewed his MRI and I think he is a good candidate for anterior approach.  I discussed transverse incision over the left rectus muscle at the L5-S1 disc space.  Discussed mobilizing the left rectus muscle entering the retroperitoneum and then mobilizing peritoneum and left ureter across midline.  Discussed also mobilizing iliac artery and vein.  Discussed risk of injury to the above structures.  Look forward to assisting Dr. Rolena Infante.  All questions answered.  Marty Heck, MD [facility] Office: [place]

## 2021-08-02 NOTE — Op Note (Signed)
OPERATIVE REPORT ? ?DATE OF SURGERY: 08/02/2021 ? ?PATIENT NAME:  John Jones ?MRN: [id number] ?DOB: 1971-05-12 ? ?PCP: Celene Squibb, MD ? ?PRE-OPERATIVE DIAGNOSIS: Lumbar degenerative disc disease with recurrent radicular left leg pain.  Status post prior posterior lumbar discectomy ? ?POST-OPERATIVE DIAGNOSIS: Same ? ?PROCEDURE:   ?ALIF L5-S1 ?Iliac crest bone graft harvest through separate incision ?Posterior spinal fusion instrumentation with pedicle screw fixation L5-S1 ? ?SURGEON:  Melina Schools, MD ?Approach surgeon: Dr. Monica Martinez ? ?PHYSICIAN ASSISTANT: Nelson Chimes, PA ?[place] IA cage: 13 mm large 8 degree lordotic ? ?EBL: 275 ml  ? ?Complications: None ? ?Implants: Globus [place] IA cage: 13 mm large 8 degree lordotic cage.  25 mm anchors and 3 ?NuVasive MIS pedicle screws.  L5: 0.5 x 45 mm length.  Right S1: 6.5 x 35 mm length.  Left S1: 6.5 x 40 mm.  40 mm length rod. ? ?Graft: Iliac crest via separate incision supplemented with DBX. ? ?Neuro monitoring: No adverse free running SSEP or EMG activity during the case.  All 4 pedicle screws were directly stimulated and there was no adverse activity at greater than 40 mA. ? ?BRIEF HISTORY: ?John Jones is a 51 y.o. male who had a prior lumbar discectomy approximately a year ago and has had ongoing progressive back buttock and radicular leg pain.  Unfortunately injection therapy, pain medical management, and activity modification had failed to improve his quality of life.  As result of the ongoing pain and the failure to improve we elected to move forward with an ALIF fusion.  All appropriate risks, benefits, and alternatives to surgery were discussed with the patient and consent was obtained. ? ?PROCEDURE DETAILS: ?Patient was brought into the operating room and was properly positioned on the operating room table.  After induction with general anesthesia the patient was endotracheally intubated.  A timeout was taken to confirm all  important data: including patient, procedure, and the level. Teds, SCD's were applied.  ? ?A Foley was placed and the patient was placed supine on the operating room table.  The neuro monitoring representative placed all appropriate needles for intraoperative SSEP and EMG monitoring.  The abdomen and left iliac crest were prepped and draped in a standard fashion.  A small incision was made over the anterior iliac crest and I sharply dissected down to like to palpate the iliac wing.  I then used the [facility] bone harvesting device to make 2 passes through the pelvis to obtain a total of 5 cc of bone graft.  There was no complications.  After I obtained an adequate amount of bone graft I irrigated the wound copiously with normal saline.  I then placed Floseal into the iliac wing to aid in hemostasis.  I then closed the deep fascia with interrupted #1 Vicryl sutures.  A layer 2-0 Vicryl sutures were then placed and finally a 3-0 Monocryl for the skin.  A small dressing was applied and then Dr. Carlis Abbott scrubbed in to perform the retroperitoneal approach to the lumbar spine.  Please refer to his dictation for specifics on the approach.  Once the retractor blades were properly positioned and needle was placed into the L5-S1 disc space and an intraoperative x-ray confirmed we are at the appropriate level.  At this point Dr. Carlis Abbott scrubbed out and I proceeded with the discectomy. ? ?A 10 blade scalpel was used to perform an annulotomy and I remove the bulk of the disc material.  Using a Cobb elevator I was  able to dissect and create a plane to release the cartilaginous endplate from L5 and S1.  This facilitated the removal of the bulk of the disc material.  Using curettes I continued to scrape to remove all the cartilaginous endplate and expose the bleeding subchondral bone.  I then used finer curettes to work my way posteriorly.  Ultimately was able to place this curved small curette behind the vertebral body of S1 and  released the annulus the entire length of the posterior aspect of S1.  I repeated this at L5.  I then used a fine nerve hook to dissect underneath the annulus and then I removed the annulus with a 2 mm  and 3 mm Kerrison rongeur.  I could now easily pass my nerve hook behind the vertebral body of L5 and S1 and I confirmed this using fluoroscopy.  I then placed a lamina spreader in the disc space and using live fluoroscopy confirmed parallel endplate distraction. ? ?With the discectomy complete I then placed the expandable trial device into the disc space.  I then trialed a 15 mm 15 degree lordotic cage as well as a 13 mm 8 degree lordotic cage.  13 mm cage provided the best overall fit.  It provided the best overall contact with the endplate of L5 and S1.  Also restored the disc space and provided adequate indirect foraminal decompression.  The implant was obtained and packed with the autograft bone and supplemented with DBX.  Then malleted the implant to the appropriate depth.  I confirmed position using AP and lateral fluoroscopy.  The locking staples were then advanced to into the body of L5 and 1 into the body of S1.  Final images were taken which demonstrate satisfactory positioning of the cage and the staples.  The wound was then copiously irrigated with normal saline and hemostasis was obtained using bipolar cautery and Floseal.  The retractors were sequentially removed and there was no active bleeding noted.  The fascia of the rectus was then reapproximated with a running #1 PDS suture.  Then a layer of 2-0 Vicryl suture and finally a 3-0 Monocryl for the skin.  An intraoperative AP x-ray was taken and read by the radiologist confirming there were no surgical instruments in the field. ? ?With the dry dressings applied the drapes were removed and the [facility] spine frame was secured over the patient.  All bony prominences well-padded and the patient was then rotated into the prone position.  The arms were  placed in a 90-90 position and all bony prominences were padded.  The back was then prepped and draped in a standard fashion.  A second timeout was taken confirming we were still proceeding with the L5-S1 posterior fusion.  Using fluoroscopy identified the lateral borders of the S1 and L5 pedicles and marked out my incision site.  I planned on making to [place] incisions to facilitate placement of the screws.  I then placed Jamshidi needle at the lateral aspect of the L5 pedicle and using fluoroscopy as well as neuro monitoring I advanced the Jamshidi needle into the pedicle.  Just as I was nearing the medial wall of the pedicle I switched to the lateral view to confirm that I was just beyond the posterior wall of the vertebral body.  With my trajectory confirmed advanced into the vertebral body and then placed my guidepin.  I removed the Jamshidi needle having cannulated the pedicle.  Using this exact same technique I cannulated the S1 and contralateral  L5 and S1 pedicles.  Once all 4 pedicles were cannulated I then measured and placed the appropriate size screw.  All 4 screws had excellent purchase.  I then directly stimulated each of the pedicle screws and there is no adverse activity greater than 40 mA.  I measured and placed the appropriate size rod and secured with a locking caps.  All 4 locking caps were tightened and torqued according manufacture standards.  X-rays were taken confirming satisfactory positioning of the implants and so the insertion tabs were broken off and removed.  Both wounds were copiously irrigated with normal saline and closed in a layered fashion with interrupted #1 Vicryl suture, 2-0 Vicryl suture, and a 3-0 Monocryl.  Steri-Strips and a dry dressing were applied.  Patient was ultimately extubated and transferred the PACU without incident.  The end of the case all needle and sponge counts were correct.  There were no adverse intraoperative events. ? ? ?Melina Schools,  MD ?08/02/2021 ?12:44 PM ? ? ?

## 2021-08-02 NOTE — Anesthesia Procedure Notes (Signed)
Procedure Name: Intubation ?Date/Time: 08/02/2021 7:58 AM ?Performed by: Maude Leriche, CRNA ?Pre-anesthesia Checklist: Patient identified, Emergency Drugs available, Suction available and Patient being monitored ?Patient Re-evaluated:Patient Re-evaluated prior to induction ?Oxygen Delivery Method: Circle system utilized ?Preoxygenation: Pre-oxygenation with 100% oxygen ?Induction Type: IV induction ?Ventilation: Mask ventilation without difficulty ?Laryngoscope Size: Mac and 3 ?Grade View: Grade I ?Tube type: Oral ?Tube size: 7.5 mm ?Number of attempts: 1 ?Airway Equipment and Method: Stylet and Bite block ?Placement Confirmation: ETT inserted through vocal cords under direct vision, positive ETCO2 and breath sounds checked- equal and bilateral ?Secured at: 22 cm ?Tube secured with: Tape ?Dental Injury: Teeth and Oropharynx as per pre-operative assessment  ?Comments: Intubation performed by Paramedic student under direct supervision by Dr. Valma Cava ? ? ? ? ?

## 2021-08-02 NOTE — Progress Notes (Signed)
Pt has a golf ball size hematoma on the left lower back. Dr. Rolena Infante notified and orders were given. Pressure dressing & ice pack was applied. RN will continue to monitor Pt. Holli Humbles, RN ?

## 2021-08-03 ENCOUNTER — Inpatient Hospital Stay (HOSPITAL_COMMUNITY): Payer: 59

## 2021-08-03 ENCOUNTER — Encounter (HOSPITAL_COMMUNITY): Payer: Self-pay | Admitting: Orthopedic Surgery

## 2021-08-03 DIAGNOSIS — Z01810 Encounter for preprocedural cardiovascular examination: Secondary | ICD-10-CM

## 2021-08-03 MED ORDER — MUPIROCIN 2 % EX OINT
1.0000 "application " | TOPICAL_OINTMENT | Freq: Two times a day (BID) | CUTANEOUS | Status: DC
  Administered 2021-08-03 – 2021-08-04 (×3): 1 via NASAL
  Filled 2021-08-03: qty 22

## 2021-08-03 MED ORDER — SORBITOL 70 % SOLN
30.0000 mL | Freq: Every day | Status: DC | PRN
  Administered 2021-08-03 (×2): 30 mL via ORAL
  Filled 2021-08-03 (×2): qty 30

## 2021-08-03 MED ORDER — CHLORHEXIDINE GLUCONATE CLOTH 2 % EX PADS
6.0000 | MEDICATED_PAD | Freq: Every day | CUTANEOUS | Status: DC
  Administered 2021-08-03 – 2021-08-04 (×2): 6 via TOPICAL

## 2021-08-03 MED FILL — Sodium Chloride IV Soln 0.9%: INTRAVENOUS | Qty: 1000 | Status: AC

## 2021-08-03 MED FILL — Heparin Sodium (Porcine) Inj 1000 Unit/ML: INTRAMUSCULAR | Qty: 30 | Status: AC

## 2021-08-03 NOTE — Progress Notes (Signed)
[facility] ? ?Subjective  -states he feels full.  No nausea or vomiting. ? ? ?Objective ?(!) 137/94 ?89 ?98.7 ?F (37.1 ?C) (Oral) ?18 ?98% ? ?Intake/Output Summary (Last 24 hours) at 08/03/2021 1116 ?Last data filed at 08/02/2021 1410 ?Gross per 24 hour  ?Intake 1770 ml  ?Output 425 ml  ?Net 1345 ml  ? ? ?Abdomen with appropriate postop incisional tenderness ?Left DP palpable ? ?Laboratory ?Lab Results: ?Recent Labs  ?  08/02/21 ?5825  ?WBC 14.5*  ?HGB 17.2*  ?HCT 52.0  ?PLT 280  ? ?BMET ?Recent Labs  ?  08/02/21 ?0639  ?NA 140  ?K 3.7  ?CL 109  ?CO2 19*  ?GLUCOSE 96  ?BUN 14  ?CREATININE 0.97  ?CALCIUM 8.7*  ? ? ?COAG ?Lab Results  ?Component Value Date  ? INR 1.02 06/27/2017  ? ?No results found for: PTT ? ?Assessment/Planning: ? ?Postop day 1 status post anterior spine exposure for L5-S1 ALIF.  Overall looks good.  Appropriate postop incisional tenderness.  Palpable DP pulse in the left foot.  Abdominal fullness but no nausea or vomiting. ? ?Marty Heck ?08/03/2021 ?11:16 AM ?-- ? ? ?

## 2021-08-03 NOTE — Evaluation (Signed)
Physical Therapy Evaluation  ?Patient Details ?Name: John Jones ?MRN: [id number] ?DOB: [birth year] ?Today's Date: 08/03/2021 ? ?History of Present Illness ? Pt is a 51 y/o male who presents s/p anterior and posterior lumbar fusions with iliac bone graft on 08/02/2021. PMH significant for HTN, carpal tunnel release R 2016, R shoulder arthroscopy and rotator cuff repair, R wrist arthroscopy.  ?Clinical Impression ? Pt admitted with above diagnosis. At the time of PT eval, pt was able to demonstrate transfers and ambulation with gross min guard assist to supervision for safety and no AD. Pt was educated on precautions, brace application/wearing schedule, appropriate activity progression, and car transfer. Pt currently with functional limitations due to the deficits listed below (see PT Problem List). Pt will benefit from skilled PT to increase their independence and safety with mobility to allow discharge to the venue listed below.     ?   ? ?Recommendations for follow up therapy are one component of a multi-disciplinary discharge planning process, led by the attending physician.  Recommendations may be updated based on patient status, additional functional criteria and insurance authorization. ? ?Follow Up Recommendations No PT follow up ? ?  ?Assistance Recommended at Discharge PRN  ?Patient can return home with the following ? A little help with walking and/or transfers;Assist for transportation;Help with stairs or ramp for entrance ? ?  ?Equipment Recommendations None recommended by PT  ?Recommendations for Other Services ?    ?  ?Functional Status Assessment Patient has had a recent decline in their functional status and demonstrates the ability to make significant improvements in function in a reasonable and predictable amount of time.  ? ?  ?Precautions / Restrictions Precautions ?Precautions: Fall;Back ?Precaution Booklet Issued: Yes (comment) ?Precaution Comments: Reviewed handout and pt was cued for  precautions during functional mobility. ?Required Braces or Orthoses: Spinal Brace ?Spinal Brace: Lumbar corset;Applied in sitting position ?Restrictions ?Weight Bearing Restrictions: No  ? ?  ? ?Mobility ? Bed Mobility ?Overal bed mobility: Needs Assistance ?Bed Mobility: Rolling, Sidelying to Sit ?Rolling: Modified independent (Device/Increase time) ?Sidelying to sit: Supervision ?  ?  ?  ?General bed mobility comments: VC's for optimal log roll technique. No assist required. HOB flat and rails lowered to simulate home environment. ?  ? ?Transfers ?Overall transfer level: Needs assistance ?Equipment used: None ?Transfers: Sit to/from Stand ?Sit to Stand: Supervision ?  ?  ?  ?  ?  ?General transfer comment: Supervision for safety. Pt appears painful and guarded with transitional movements, however no assist required. ?  ? ?Ambulation/Gait ?Ambulation/Gait assistance: Supervision, Min guard ?Gait Distance (Feet): 300 Feet ?Assistive device: None ?Gait Pattern/deviations: Step-through pattern, Decreased stride length, Trunk flexed ?Gait velocity: Decreased ?Gait velocity interpretation: <1.31 ft/sec, indicative of household ambulator ?  ?General Gait Details: Min guard progressing to supervision for safety. Pt endorses pain mostly in the bone graft site. ? ?Stairs ?Stairs: Yes ?Stairs assistance: Min guard ?Stair Management: One rail Right, Step to pattern, Forwards ?Number of Stairs: 2 ?General stair comments: VC's for sequencing and general safety. Pt was able to complete well. ? ?Wheelchair Mobility ?  ? ?Modified Rankin (Stroke Patients Only) ?  ? ?  ? ?Balance Overall balance assessment: Mild deficits observed, not formally tested ?  ?  ?  ?  ?  ?  ?  ?  ?  ?  ?  ?  ?  ?  ?  ?  ?  ?  ?   ? ? ? ?Pertinent Vitals/Pain Pain  Assessment ?Pain Assessment: Faces ?Faces Pain Scale: Hurts even more ?Pain Location: Abdomen, bone graft site ?Pain Descriptors / Indicators: Operative site guarding, Sore, Aching ?Pain  Intervention(s): Limited activity within patient's tolerance, Monitored during session, Repositioned  ? ? ?Home Living Family/patient expects to be discharged to:: Private residence ?Living Arrangements: Spouse/significant other ?Available Help at Discharge: Family ?Type of Home: House ?Home Access: Stairs to enter ?  ?Entrance Stairs-Number of Steps: 2 ?  ?Home Layout: One level ?Home Equipment: Shower seat ?   ?  ?Prior Function Prior Level of Function : Independent/Modified Independent ?  ?  ?  ?  ?  ?  ?  ?  ?  ? ? ?Hand Dominance  ?   ? ?  ?Extremity/Trunk Assessment  ? Upper Extremity Assessment ?Upper Extremity Assessment: Defer to OT evaluation ?  ? ?Lower Extremity Assessment ?Lower Extremity Assessment: Generalized weakness (Consistent with pre-op diagnosis) ?  ? ?Cervical / Trunk Assessment ?Cervical / Trunk Assessment: Back Surgery  ?Communication  ? Communication: No difficulties  ?Cognition Arousal/Alertness: Awake/alert ?Behavior During Therapy: [facility] for tasks assessed/performed ?Overall Cognitive Status: Within Functional Limits for tasks assessed ?  ?  ?  ?  ?  ?  ?  ?  ?  ?  ?  ?  ?  ?  ?  ?  ?  ?  ?  ? ?  ?General Comments   ? ?  ?Exercises    ? ?Assessment/Plan  ?  ?PT Assessment Patient needs continued PT services  ?PT Problem List Decreased strength;Decreased activity tolerance;Decreased balance;Decreased mobility;Decreased knowledge of use of DME;Decreased safety awareness;Decreased knowledge of precautions;Pain ? ?   ?  ?PT Treatment Interventions DME instruction;Gait training;Stair training;Functional mobility training;Therapeutic activities;Therapeutic exercise;Patient/family education;Balance training   ? ?PT Goals (Current goals can be found in the Care Plan section)  ?Acute Rehab PT Goals ?Patient Stated Goal: Home today ?PT Goal Formulation: With patient/family ?Time For Goal Achievement: 08/10/21 ?Potential to Achieve Goals: Good ? ?  ?Frequency Min 5X/week ?  ? ? ?Co-evaluation   ?  ?   ?  ?  ? ? ?  ?AM-PAC PT "6 Clicks" Mobility  ?Outcome Measure Help needed turning from your back to your side while in a flat bed without using bedrails?: None ?Help needed moving from lying on your back to sitting on the side of a flat bed without using bedrails?: None ?Help needed moving to and from a bed to a chair (including a wheelchair)?: A Little ?Help needed standing up from a chair using your arms (e.g., wheelchair or bedside chair)?: A Little ?Help needed to walk in hospital room?: A Little ?Help needed climbing 3-5 steps with a railing? : A Little ?6 Click Score: 20 ? ?  ?End of Session Equipment Utilized During Treatment: Gait belt;Back brace ?Activity Tolerance: Patient tolerated treatment well ?Patient left: Other (comment) (Pt left in bathroom attempting a BM. RN notified.) ?Nurse Communication: Mobility status ?PT Visit Diagnosis: Unsteadiness on feet (R26.81);Pain ?Pain - part of body:  (back, abdomen, hip) ?  ? ?Time: 0630-1601 ?PT Time Calculation (min) (ACUTE ONLY): 23 min ? ? ?Charges:   PT Evaluation ?$PT Eval Low Complexity: 1 Low ?PT Treatments ?$Gait Training: 8-22 mins ?  ?   ? ? ?John Jones, PT, DPT ?Acute Rehabilitation Services ?Pager: [phone] ?Office: [phone]  ? ?John Jones ?08/03/2021, 2:25 PM ? ?

## 2021-08-03 NOTE — Progress Notes (Signed)
OT Cancellation Note ? ?Patient Details ?Name: John Jones ?MRN: [id number] ?DOB: [birth year] ? ? ?Cancelled Treatment:    Reason Eval/Treat Not Completed: Fatigue/lethargy limiting ability to participate;Pain limiting ability to participate.  Will reattempt. ? ?Lakeita Panther C., OTR/L ?Acute Rehabilitation Services ?Pager [phone] ?Office [phone] ? ? ?Najae Rathert M ?08/03/2021, 11:30 AM ?

## 2021-08-03 NOTE — Progress Notes (Signed)
Subjective: ?1 Day Post-Op Procedure(s) (LRB): ?ANTERIOR LUMBAR INTERBODY FUSION WITH POSTERIOR SPINAL FUSION, INSTRUMENTATION AND ILIAC CREST BONE GRAFT HARVEST LUMBAR FIVE THROUGH SACRAL ONE (N/A) ?HARVEST ILIAC BONE GRAFT (N/A) ?ABDOMINAL EXPOSURE FOR ANTERIOR LUMBAR SPINE SURGERY (N/A) ?Patient reports pain as moderate.  Radicular right leg pain improved. +left iliopsoas pain.  ?Tolerating PO ?-Flatus, says he feels bloated.  ?+void ?Denies calf pain or SOB ? ?Objective: ?Vital signs in last 24 hours: ?Temp:  [97.8 ?F (36.6 ?C)-98.9 ?F (37.2 ?C)] 97.8 ?F (36.6 ?C) (03/03 0424) ?Pulse Rate:  [97-112] 110 (03/03 0424) ?Resp:  [14-20] 20 (03/03 0424) ?BP: (114-143)/(73-95) 143/95 (03/03 0424) ?SpO2:  [95 %-98 %] 96 % (03/03 0424) ? ?Intake/Output from previous day: ?03/02 0701 - 03/03 0700 ?In: 2770 [I.V.:2650; Blood:120] ?Out: 925 [Urine:650; Blood:275] ?Intake/Output this shift: ?No intake/output data recorded. ? ?Recent Labs  ?  08/02/21 ?0639  ?HGB 17.2*  ? ?Recent Labs  ?  08/02/21 ?0639  ?WBC 14.5*  ?RBC 5.43  ?HCT 52.0  ?PLT 280  ? ?Recent Labs  ?  08/02/21 ?0639  ?NA 140  ?K 3.7  ?CL 109  ?CO2 19*  ?BUN 14  ?CREATININE 0.97  ?GLUCOSE 96  ?CALCIUM 8.7*  ? ?No results for input(s): LABPT, INR in the last 72 hours. ? ?Neurologically intact ?ABD soft ?Neurovascular intact ?Sensation intact distally ?Intact pulses distally ?Dorsiflexion/Plantar flexion intact ?Incision: anterior incision and right posterior incision C/D/I. Left posterior incision with apparent hard mass/hematoma, currently a pressure dressing is in place which I left for now ?No cellulitis present ?Compartment soft ? ? ?Assessment/Plan: ?1 Day Post-Op Procedure(s) (LRB): ?ANTERIOR LUMBAR INTERBODY FUSION WITH POSTERIOR SPINAL FUSION, INSTRUMENTATION AND ILIAC CREST BONE GRAFT HARVEST LUMBAR FIVE THROUGH SACRAL ONE (N/A) ?HARVEST ILIAC BONE GRAFT (N/A) ?ABDOMINAL EXPOSURE FOR ANTERIOR LUMBAR SPINE SURGERY (N/A) ?Advance diet ?Up with therapy ?LSO  when OOB ?DVT: Teds, SCDs, ambulation. We will hold lovenox for now given hematoma.  ?We will continue pressure dressing over hematoma, Dr. Rolena Infante will be by later today to check on the patient.  ?IS encouraged.  ? ? ? ?Charlyne Petrin ?08/03/2021, 7:45 AM ? ?

## 2021-08-04 MED ORDER — ALUM & MAG HYDROXIDE-SIMETH 200-200-20 MG/5ML PO SUSP
30.0000 mL | Freq: Four times a day (QID) | ORAL | Status: DC | PRN
  Administered 2021-08-04: 30 mL via ORAL
  Filled 2021-08-04: qty 30

## 2021-08-04 NOTE — Progress Notes (Signed)
Patient is discharged from room 3C08 at this time. Alert and in stable condition. IV site d/c'd and instructions read to patient and spouse with understanding verbalized and all questions answered. Left unit via wheelchair with all belongings at side. ?

## 2021-08-04 NOTE — Evaluation (Signed)
Occupational Therapy Evaluation ?Patient Details ?Name: John Jones ?MRN: [id number] ?DOB: [birth year] ?Today's Date: 08/04/2021 ? ? ?History of Present Illness Pt is a 51 y/o male who presents s/p anterior and posterior lumbar fusions with iliac bone graft on 08/02/2021. PMH significant for HTN, carpal tunnel release R 2016, R shoulder arthroscopy and rotator cuff repair, R wrist arthroscopy.  ? ?Clinical Impression ?  ?Patient admitted for the procedure above.  Patient was up and dressed when OT entered.  He has expected post op pain, but is up and moving well.  Back precautions reviewed and all questions answered.  Reacher issued to assist with ADL completion, and patient is requesting a shower seat for home use.  No further OT needs in the acute setting.  Patient has good understanding of brace usage.    ?   ? ?Recommendations for follow up therapy are one component of a multi-disciplinary discharge planning process, led by the attending physician.  Recommendations may be updated based on patient status, additional functional criteria and insurance authorization.  ? ?Follow Up Recommendations ? No OT follow up  ?  ?Assistance Recommended at Discharge Set up Supervision/Assistance  ?Patient can return home with the following   ? ?  ?Functional Status Assessment ? Patient has had a recent decline in their functional status and demonstrates the ability to make significant improvements in function in a reasonable and predictable amount of time.  ?Equipment Recommendations ? Tub/shower seat  ?  ?Recommendations for Other Services   ? ? ?  ?Precautions / Restrictions Precautions ?Precautions: Fall;Back ?Precaution Comments: Reviewed handout and pt was cued for precautions during functional mobility. ?Required Braces or Orthoses: Spinal Brace ?Spinal Brace: Lumbar corset;Applied in sitting position ?Restrictions ?Weight Bearing Restrictions: No  ? ?  ? ?Mobility Bed Mobility ?Overal bed mobility: Needs Assistance ?  ?   ?  ?  ?  ?  ?  ?  ? ?Transfers ?Overall transfer level: Needs assistance ?Equipment used: None ?Transfers: Sit to/from Stand ?Sit to Stand: Supervision ?  ?  ?  ?  ?  ?  ?  ? ?  ?Balance Overall balance assessment: Mild deficits observed, not formally tested ?  ?  ?  ?  ?  ?  ?  ?  ?  ?  ?  ?  ?  ?  ?  ?  ?  ?  ?   ? ?ADL either performed or assessed with clinical judgement  ? ?ADL Overall ADL's : At baseline ?  ?  ?  ?  ?  ?  ?  ?  ?  ?  ?  ?  ?  ?  ?  ?  ?  ?  ?  ?   ? ? ? ?Vision Patient Visual Report: No change from baseline ?   ?   ?Perception Perception ?Perception: Not tested ?  ?Praxis Praxis ?Praxis: Not tested ?  ? ?Pertinent Vitals/Pain Pain Assessment ?Faces Pain Scale: Hurts little more ?Pain Location: Abdomen, bone graft site ?Pain Descriptors / Indicators: Operative site guarding, Sore, Aching ?Pain Intervention(s): Monitored during session  ? ? ? ?Hand Dominance Right ?  ?Extremity/Trunk Assessment Upper Extremity Assessment ?Upper Extremity Assessment: Overall WFL for tasks assessed ?  ?Lower Extremity Assessment ?Lower Extremity Assessment: Defer to PT evaluation ?  ?Cervical / Trunk Assessment ?Cervical / Trunk Assessment: Back Surgery ?  ?Communication Communication ?Communication: No difficulties ?  ?Cognition Arousal/Alertness: Awake/alert ?Behavior During Therapy: [facility] for tasks assessed/performed ?Overall  Cognitive Status: Within Functional Limits for tasks assessed ?  ?  ?  ?  ?  ?  ?  ?  ?  ?  ?  ?  ?  ?  ?  ?  ?  ?  ?  ?General Comments    ? ?  ?Exercises   ?  ?Shoulder Instructions    ? ? ?Home Living Family/patient expects to be discharged to:: Private residence ?Living Arrangements: Spouse/significant other ?Available Help at Discharge: Family ?Type of Home: House ?Home Access: Stairs to enter ?  ?  ?Home Layout: One level ?  ?  ?Bathroom Shower/Tub: Tub/shower unit ?  ?Bathroom Toilet: Standard ?  ?  ?  ?  ?Additional Comments: spouse says they do not have a shower seat ?  ? ?  ?Prior  Functioning/Environment Prior Level of Function : Independent/Modified Independent ?  ?  ?  ?  ?  ?  ?  ?  ?  ? ?  ?  ?OT Problem List: Pain ?  ?   ?OT Treatment/Interventions:    ?  ?OT Goals(Current goals can be found in the care plan section) Acute Rehab OT Goals ?Patient Stated Goal: Return home ?OT Goal Formulation: With patient ?Time For Goal Achievement: 08/04/21 ?Potential to Achieve Goals: Good  ?OT Frequency:   ?  ? ?Co-evaluation   ?  ?  ?  ?  ? ?  ?AM-PAC OT "6 Clicks" Daily Activity     ?Outcome Measure Help from another person eating meals?: None ?Help from another person taking care of personal grooming?: None ?Help from another person toileting, which includes using toliet, bedpan, or urinal?: None ?Help from another person bathing (including washing, rinsing, drying)?: A Little ?Help from another person to put on and taking off regular upper body clothing?: None ?Help from another person to put on and taking off regular lower body clothing?: A Little ?6 Click Score: 22 ?  ?End of Session Nurse Communication: Mobility status ? ?Activity Tolerance: Patient tolerated treatment well ?Patient left: in chair;with family/visitor present ? ?OT Visit Diagnosis: Pain  ?              ?Time: 4627-0350 ?OT Time Calculation (min): 18 min ?Charges:  OT General Charges ?$OT Visit: 1 Visit ?OT Evaluation ?$OT Eval Moderate Complexity: 1 Mod ? ?08/04/2021 ? ?RP, OTR/L ? ?Acute Rehabilitation Services ? ?Office:  [phone] ? ? ?Tiea Manninen D Fayth Trefry ?08/04/2021, 8:39 AM ?

## 2021-08-04 NOTE — Discharge Summary (Signed)
Patient ID: John Jones MRN: [id number] DOB/AGE: 10/04/1970 51 y.o.  Admit date: 08/02/2021 Discharge date: 08/04/2021  Primary Diagnosis: Low back pain with radiculopathy Admission Diagnoses: Status post anterior lumbar interbody fusion Past Medical History:  Diagnosis Date   History of kidney stones    multiple episodes, passed stones   Hypertension    Migraine    Right ureteral stone    Sleep apnea    does not use cpap, lost weight 45lbs   Discharge Diagnoses:   Principal Problem:   S/P lumbar fusion  Estimated body mass index is 25.92 kg/m as calculated from the following:   Height as of this encounter: '5\' 4"'$  (1.626 m).   Weight as of this encounter: 68.5 kg.  Procedure:  Procedure(s) (LRB): ANTERIOR LUMBAR INTERBODY FUSION WITH POSTERIOR SPINAL FUSION, INSTRUMENTATION AND ILIAC CREST BONE GRAFT HARVEST LUMBAR FIVE THROUGH SACRAL ONE (N/A) HARVEST ILIAC BONE GRAFT (N/A) ABDOMINAL EXPOSURE FOR ANTERIOR LUMBAR SPINE SURGERY (N/A)   Consults: None  HPI: John Jones is a 51 year old male who had a history of L5-S1 discectomy however patient never got any significant relief after that surgery.  He had undergone multiple conservative treatment options and ultimately these failed to provide significant improvement in his quality of life.  Had a consultation with Dr. Rolena Infante in the office and determined that a fusion would be the next up to alleviate his pain.  Patient presented to the the hospital on 08/02/2021 for elective anterior lumbar interbody fusion with posterior spinal fusion.  Laboratory Data: Admission on 08/02/2021, Discharged on 08/04/2021  Component Date Value Ref Range Status   SARS Coronavirus 2 08/02/2021 NEGATIVE  NEGATIVE Final   Comment: (NOTE) SARS-CoV-2 target nucleic acids are NOT DETECTED.  The SARS-CoV-2 RNA is generally detectable in upper and lower respiratory specimens during the acute phase of infection. The lowest concentration of  SARS-CoV-2 viral copies this assay can detect is 250 copies / mL. A negative result does not preclude SARS-CoV-2 infection and should not be used as the sole basis for treatment or other patient management decisions.  A negative result may occur with improper specimen collection / handling, submission of specimen other than nasopharyngeal swab, presence of viral mutation(s) within the areas targeted by this assay, and inadequate number of viral copies (<250 copies / mL). A negative result must be combined with clinical observations, patient history, and epidemiological information.  Fact Sheet for Patients:   StrictlyIdeas.no  Fact Sheet for Healthcare Providers: BankingDealers.co.za  This test is not yet approved or                           cleared by the [place] FDA and has been authorized for detection and/or diagnosis of SARS-CoV-2 by FDA under an Emergency Use Authorization (EUA).  This EUA will remain in effect (meaning this test can be used) for the duration of the COVID-19 declaration under Section 564(b)(1) of the Act, 21 U.S.C. section 360bbb-3(b)(1), unless the authorization is terminated or revoked sooner.  Performed at [facility], [place] [address], [place], [place] [address]    Sodium 08/02/2021 140  135 - 145 mmol/L Final   Potassium 08/02/2021 3.7  3.5 - 5.1 mmol/L Final   Chloride 08/02/2021 109  98 - 111 mmol/L Final   CO2 08/02/2021 19 (L)  22 - 32 mmol/L Final   Glucose, Bld 08/02/2021 96  70 - 99 mg/dL Final   Glucose reference range applies  only to samples taken after fasting for at least 8 hours.   BUN 08/02/2021 14  6 - 20 mg/dL Final   Creatinine, Ser 08/02/2021 0.97  0.61 - 1.24 mg/dL Final   Calcium 08/02/2021 8.7 (L)  8.9 - 10.3 mg/dL Final   GFR, Estimated 08/02/2021 >60  >60 mL/min Final   Comment: (NOTE) Calculated using the CKD-EPI Creatinine Equation (2021)    Anion gap 08/02/2021 12  5  - 15 Final   Performed at [facility], [place] [address] [place], [place] [address]   WBC 08/02/2021 14.5 (H)  4.0 - 10.5 K/uL Final   RBC 08/02/2021 5.43  4.22 - 5.81 MIL/uL Final   Hemoglobin 08/02/2021 17.2 (H)  13.0 - 17.0 g/dL Final   HCT 08/02/2021 52.0  39.0 - 52.0 % Final   MCV 08/02/2021 95.8  80.0 - 100.0 fL Final   MCH 08/02/2021 31.7  26.0 - 34.0 pg Final   MCHC 08/02/2021 33.1  30.0 - 36.0 g/dL Final   RDW 08/02/2021 13.4  11.5 - 15.5 % Final   Platelets 08/02/2021 280  150 - 400 K/uL Final   nRBC 08/02/2021 0.0  0.0 - 0.2 % Final   Performed at [facility] [address], [place], [place] [address]   Color, Urine 08/02/2021 YELLOW  YELLOW Final   APPearance 08/02/2021 CLEAR  CLEAR Final   Specific Gravity, Urine 08/02/2021 1.016  1.005 - 1.030 Final   pH 08/02/2021 5.0  5.0 - 8.0 Final   Glucose, UA 08/02/2021 NEGATIVE  NEGATIVE mg/dL Final   Hgb urine dipstick 08/02/2021 SMALL (A)  NEGATIVE Final   Bilirubin Urine 08/02/2021 NEGATIVE  NEGATIVE Final   Ketones, ur 08/02/2021 NEGATIVE  NEGATIVE mg/dL Final   Protein, ur 08/02/2021 NEGATIVE  NEGATIVE mg/dL Final   Nitrite 08/02/2021 NEGATIVE  NEGATIVE Final   Leukocytes,Ua 08/02/2021 NEGATIVE  NEGATIVE Final   RBC / HPF 08/02/2021 0-5  0 - 5 RBC/hpf Final   WBC, UA 08/02/2021 0-5  0 - 5 WBC/hpf Final   Bacteria, UA 08/02/2021 NONE SEEN  NONE SEEN Final   Squamous Epithelial / LPF 08/02/2021 0-5  0 - 5 Final   Mucus 08/02/2021 PRESENT   Final   Performed at [facility], [place] [address] [place], [place] [address]   MRSA, PCR 08/02/2021 NEGATIVE  NEGATIVE Final   Staphylococcus aureus 08/02/2021 POSITIVE (A)  NEGATIVE Final   Comment: (NOTE) The Xpert SA Assay (FDA approved for NASAL specimens in patients 45 years of age and older), is one component of a comprehensive surveillance program. It is not intended to diagnose infection nor to guide or monitor treatment. Performed at [facility],  [place] [address], [place], [place] [address]    ABO/RH(D) 08/02/2021 B POS   Final   Antibody Screen 08/02/2021 NEG   Final   Sample Expiration 08/02/2021    Final                   Value:08/05/2021,2359 Performed at [facility], [place] [address] [place], [place] [address]    ABO/RH(D) 08/02/2021    Final                   Value:B POS Performed at [facility], [place] [address], [place], [place] [address]      X-Rays:DG Lumbar Spine 2-3 Views  Result Date: 08/02/2021 CLINICAL DATA:  Fluoroscopic assistance for lumbar spine surgery EXAM:  LUMBAR SPINE - 2-3 VIEW COMPARISON:  None. FINDINGS: Fluoroscopic images show surgical fusion at L5-S1 level. Intervertebral disc spacer is seen. Fluoroscopic time was 237 seconds. Radiation dose is 81.9 mGy. IMPRESSION: Fluoroscopic assistance was provided for lumbar fusion at L5-S1 level. Electronically Signed   By: Elmer Picker M.D.   On: 08/02/2021 13:03   DG C-Arm 1-60 Min-No Report  Result Date: 08/02/2021 Fluoroscopy was utilized by the requesting physician.  No radiographic interpretation.   DG C-Arm 1-60 Min-No Report  Result Date: 08/02/2021 Fluoroscopy was utilized by the requesting physician.  No radiographic interpretation.   DG C-Arm 1-60 Min-No Report  Result Date: 08/02/2021 Fluoroscopy was utilized by the requesting physician.  No radiographic interpretation.   DG C-Arm 1-60 Min-No Report  Result Date: 08/02/2021 Fluoroscopy was utilized by the requesting physician.  No radiographic interpretation.   DG C-Arm 1-60 Min-No Report  Result Date: 08/02/2021 Fluoroscopy was utilized by the requesting physician.  No radiographic interpretation.   VAS [place] LOWER EXTREMITY VENOUS (DVT)  Result Date: 08/03/2021  Lower Venous DVT Study Patient Name:  John Jones  Date of Exam:   08/03/2021 Medical Rec #: [id number]            Accession #:    [id number] Date of Birth: 1970/12/20            Patient Gender: M Patient Age:   57 years  Exam Location:  [facility] Procedure:      VAS [place] LOWER EXTREMITY VENOUS (DVT) Referring Phys: Duane Lope BROOKS --------------------------------------------------------------------------------  Indications: Status post ALIF surgery on 08/02/21.  Comparison Study: No prior. Performing Technologist: Oda Cogan RDMS, RVT  Examination Guidelines: A complete evaluation includes B-mode imaging, spectral Doppler, color Doppler, and power Doppler as needed of all accessible portions of each vessel. Bilateral testing is considered an integral part of a complete examination. Limited examinations for reoccurring indications may be performed as noted. The reflux portion of the exam is performed with the patient in reverse Trendelenburg.  +---------+---------------+---------+-----------+----------+--------------+  RIGHT     Compressibility Phasicity Spontaneity Properties Thrombus Aging  +---------+---------------+---------+-----------+----------+--------------+  CFV       Full            Yes       Yes                                    +---------+---------------+---------+-----------+----------+--------------+  SFJ       Full                                                             +---------+---------------+---------+-----------+----------+--------------+  FV Prox   Full                                                             +---------+---------------+---------+-----------+----------+--------------+  FV Mid    Full                                                             +---------+---------------+---------+-----------+----------+--------------+  FV Distal Full                                                             +---------+---------------+---------+-----------+----------+--------------+  PFV       Full                                                             +---------+---------------+---------+-----------+----------+--------------+  POP       Full            Yes       Yes                                     +---------+---------------+---------+-----------+----------+--------------+  PTV       Full                                                             +---------+---------------+---------+-----------+----------+--------------+  PERO      Full                                                             +---------+---------------+---------+-----------+----------+--------------+   +---------+---------------+---------+-----------+----------+--------------+  LEFT      Compressibility Phasicity Spontaneity Properties Thrombus Aging  +---------+---------------+---------+-----------+----------+--------------+  CFV       Full            Yes       Yes                                    +---------+---------------+---------+-----------+----------+--------------+  SFJ       Full                                                             +---------+---------------+---------+-----------+----------+--------------+  FV Prox   Full                                                             +---------+---------------+---------+-----------+----------+--------------+  FV Mid    Full                                                             +---------+---------------+---------+-----------+----------+--------------+  FV Distal Full                                                             +---------+---------------+---------+-----------+----------+--------------+  PFV       Full                                                             +---------+---------------+---------+-----------+----------+--------------+  POP       Full            Yes       Yes                                    +---------+---------------+---------+-----------+----------+--------------+  PTV       Full                                                             +---------+---------------+---------+-----------+----------+--------------+  PERO      Full                                                              +---------+---------------+---------+-----------+----------+--------------+     Summary: BILATERAL: - No evidence of deep vein thrombosis seen in the lower extremities, bilaterally. -No evidence of popliteal cyst, bilaterally.   *See table(s) above for measurements and observations. Electronically signed by Orlie Pollen on 08/03/2021 at 7:21:25 PM.    Final    DG OR LOCAL ABDOMEN  Result Date: 08/02/2021 CLINICAL DATA:  Unintentional retained foreign body status post L5-S1 ALIF EXAM: OR LOCAL ABDOMEN COMPARISON:  None. FINDINGS: No bowel dilatation to suggest obstruction. No evidence of pneumoperitoneum, portal venous gas or pneumatosis. No pathologic calcifications along the expected course of the ureters. Anterior lumbar interbody fusion at L5-S1. Two surgical clips projecting over the sacrum. Otherwise, no radiopaque foreign body. IMPRESSION: 1. No radiopaque foreign body to suggest a retained instrument. These results were called by telephone at the time of interpretation on 08/02/2021 at 10:47 am to [place] , who verbally acknowledged these results. Electronically Signed   By: Kathreen Devoid M.D.   On: 08/02/2021 10:50    EKG: Orders placed or performed during the hospital encounter of 04/20/21   EKG 12-Lead   EKG 12-Lead     Hospital Course: John Jones is a 51 y.o. who was admitted to Hospital. They were brought to the operating room on 08/02/2021 and underwent Procedure(s): ANTERIOR LUMBAR INTERBODY FUSION WITH POSTERIOR SPINAL FUSION, INSTRUMENTATION AND ILIAC CREST BONE GRAFT HARVEST LUMBAR FIVE THROUGH SACRAL ONE HARVEST ILIAC BONE GRAFT ABDOMINAL EXPOSURE FOR ANTERIOR LUMBAR SPINE SURGERY.  Patient tolerated the procedure well  and was later transferred to the recovery room and then to the orthopaedic floor for postoperative care.  They were given PO and IV analgesics for pain control following their surgery.  They were given 24 hours of postoperative antibiotics of   Anti-infectives (From admission, onward)    Start     Dose/Rate Route Frequency Ordered Stop   08/03/21 0000  vancomycin (VANCOREADY) IVPB 750 mg/150 mL        750 mg 150 mL/hr over 60 Minutes Intravenous  Once 08/02/21 1520 08/03/21 0040   08/02/21 0600  vancomycin (VANCOCIN) IVPB 1000 mg/200 mL premix        1,000 mg 200 mL/hr over 60 Minutes Intravenous 60 min pre-op 08/02/21 0600 08/02/21 0733      and started on DVT prophylaxis in the form of Lovenox.   PT and OT were ordered for assistance with rehabilitation.  Discharge planning consulted to help with postop disposition and equipment needs.  Patient had an uneventful night on the evening of surgery.  They started to get up OOB with therapy on day one. Hemovac drain was pulled without difficulty.  Patient was doing well though Lovenox was held due to small hematoma at his incision site.  He otherwise was doing well.  He was seen in rounds and had passed gas.  He was having some acid reflux symptoms.  Incisions were healing well.  Patient was ready for discharge home. Diet: Regular diet Activity:WBAT Follow-up:in 2 weeks Disposition - Home Discharged Condition: good   Discharge Instructions     Call MD / Call 911   Complete by: As directed    If you experience chest pain or shortness of breath, CALL 911 and be transported to the hospital emergency room.  If you develope a fever above 101 F, pus (white drainage) or increased drainage or redness at the wound, or calf pain, call your surgeon's office.   Constipation Prevention   Complete by: As directed    Drink plenty of fluids.  Prune juice may be helpful.  You may use a stool softener, such as Colace (over the counter) 100 mg twice a day.  Use MiraLax (over the counter) for constipation as needed.   Diet - low sodium heart healthy   Complete by: As directed    Incentive spirometry RT   Complete by: As directed    Increase activity slowly as tolerated   Complete by: As directed     Post-operative opioid taper instructions:   Complete by: As directed    POST-OPERATIVE OPIOID TAPER INSTRUCTIONS: It is important to wean off of your opioid medication as soon as possible. If you do not need pain medication after your surgery it is ok to stop day one. Opioids include: Codeine, Hydrocodone(Norco, Vicodin), Oxycodone(Percocet, oxycontin) and hydromorphone amongst others.  Long term and even short term use of opiods can cause: Increased pain response Dependence Constipation Depression Respiratory depression And more.  Withdrawal symptoms can include Flu like symptoms Nausea, vomiting And more Techniques to manage these symptoms Hydrate well Eat regular healthy meals Stay active Use relaxation techniques(deep breathing, meditating, yoga) Do Not substitute Alcohol to help with tapering If you have been on opioids for less than two weeks and do not have pain than it is ok to stop all together.  Plan to wean off of opioids This plan should start within one week post op of your joint replacement. Maintain the same interval or time between taking each dose and first decrease the  dose.  Cut the total daily intake of opioids by one tablet each day Next start to increase the time between doses. The last dose that should be eliminated is the evening dose.         Allergies as of 08/04/2021       Reactions   Penicillins Other (See Comments)   Unknown-childhood Has patient had a PCN reaction causing immediate rash, facial/tongue/throat swelling, SOB or lightheadedness with hypotension: Unknown Has patient had a PCN reaction causing severe rash involving mucus membranes or skin necrosis: Unknown Has patient had a PCN reaction that required hospitalization: Unknown Has patient had a PCN reaction occurring within the last 10 years: No If all of the above answers are "NO", then may proceed with Cephalosporin use.        Medication List     STOP taking these  medications    ibuprofen 200 MG tablet Commonly known as: ADVIL   omeprazole 40 MG capsule Commonly known as: PRILOSEC       TAKE these medications    amphetamine-dextroamphetamine 30 MG tablet Commonly known as: ADDERALL Take 30 mg by mouth 2 (two) times daily.   enoxaparin 40 MG/0.4ML injection Commonly known as: LOVENOX Inject 0.4 mLs (40 mg total) into the skin daily for 10 days. 10 day supply 1 injection per day   methocarbamol 500 MG tablet Commonly known as: Robaxin Take 1 tablet (500 mg total) by mouth every 8 (eight) hours as needed for up to 5 days for muscle spasms.   ondansetron 4 MG tablet Commonly known as: Zofran Take 1 tablet (4 mg total) by mouth every 8 (eight) hours as needed for nausea or vomiting.   oxyCODONE-acetaminophen 10-325 MG tablet Commonly known as: Percocet Take 1 tablet by mouth every 6 (six) hours as needed for up to 5 days for pain.        Follow-up Information     Melina Schools, MD. Schedule an appointment as soon as possible for a visit in 2 week(s).   Specialty: Orthopedic Surgery Why: If symptoms worsen, For suture removal, For wound re-check Contact information: [address] [place] [address] [place] [address] [phone]                 Signed: Jonelle Sidle PA-C Orthopaedic Surgery 08/04/2021, 6:30 PM

## 2021-08-04 NOTE — Progress Notes (Signed)
PT Cancellation Note ? ?Patient Details ?Name: John Jones ?MRN: [id number] ?DOB: [birth year] ? ? ?Cancelled Treatment:    Reason Eval/Treat Not Completed: (P) Other (comment) (Pt with severe indigestion and reports, "Can't I wait until this improves."  Pt standing in room with out brace donned.  Educated when seated or standing that brace should be in place.  Will return if/when patient is able.) ? ? ?Khristy Kalan Eli Hose ?08/04/2021, 9:03 AM ? ?Tami Blass R. , PTA ?Acute Rehabilitation Services ?Pager [phone] ?Office [phone] ? ?

## 2021-08-04 NOTE — Progress Notes (Signed)
PT Cancellation Note ? ?Patient Details ?Name: John Jones ?MRN: [id number] ?DOB: [birth year] ? ? ?Cancelled Treatment:    Reason Eval/Treat Not Completed: (P) Other (comment) (continues to report bad indigestion. He reports he feels confident in mobility.  reviewed spinal precautions with patient.  Plan for d/c home today.) ? ? ?[name] ?08/04/2021, 9:57 AM ? ?[name] , PTA ?Acute Rehabilitation Services ?Pager [phone] ?Office [phone] ? ?

## 2021-08-04 NOTE — Care Management (Signed)
Spoke w bedside RN who states that patient will be on baby ASA NOT Lovenox. She produced voided rx. Patient updated that he will not be on Lovenox.  ?

## 2021-08-07 ENCOUNTER — Encounter: Payer: 59 | Admitting: Vascular Surgery

## 2021-08-08 ENCOUNTER — Inpatient Hospital Stay (HOSPITAL_COMMUNITY): Admission: RE | Admit: 2021-08-08 | Payer: 59 | Source: Ambulatory Visit

## 2021-08-08 ENCOUNTER — Other Ambulatory Visit (HOSPITAL_COMMUNITY): Payer: Self-pay | Admitting: Orthopedic Surgery

## 2021-08-08 DIAGNOSIS — M79604 Pain in right leg: Secondary | ICD-10-CM

## 2021-08-17 ENCOUNTER — Other Ambulatory Visit: Payer: Self-pay | Admitting: Orthopedic Surgery

## 2021-08-17 ENCOUNTER — Other Ambulatory Visit (HOSPITAL_COMMUNITY): Payer: Self-pay | Admitting: Orthopedic Surgery

## 2021-08-17 DIAGNOSIS — M79604 Pain in right leg: Secondary | ICD-10-CM

## 2021-08-20 ENCOUNTER — Other Ambulatory Visit: Payer: Self-pay

## 2021-08-20 ENCOUNTER — Ambulatory Visit (HOSPITAL_COMMUNITY)
Admission: RE | Admit: 2021-08-20 | Discharge: 2021-08-20 | Disposition: A | Discharge status: Home / Self Care | Payer: 59 | Source: Ambulatory Visit | Attending: Orthopedic Surgery | Admitting: Orthopedic Surgery

## 2021-08-20 DIAGNOSIS — M79605 Pain in left leg: Secondary | ICD-10-CM | POA: Diagnosis present

## 2021-08-20 DIAGNOSIS — M79604 Pain in right leg: Secondary | ICD-10-CM | POA: Diagnosis present

## 2021-11-05 ENCOUNTER — Ambulatory Visit (HOSPITAL_COMMUNITY): Payer: 59

## 2021-11-05 NOTE — Therapy (Incomplete)
OUTPATIENT PHYSICAL THERAPY THORACOLUMBAR EVALUATION   Patient Name: John Jones MRN: [id number] DOB:1970-11-01, 51 y.o., male Today's Date: 11/05/2021    Past Medical History:  Diagnosis Date   History of kidney stones    multiple episodes, passed stones   Hypertension    Migraine    Right ureteral stone    Sleep apnea    does not use cpap, lost weight 45lbs   Past Surgical History:  Procedure Laterality Date   ABDOMINAL EXPOSURE N/A 08/02/2021   Procedure: ABDOMINAL EXPOSURE FOR ANTERIOR LUMBAR SPINE SURGERY;  Surgeon: Marty Heck, MD;  Location: [place];  Service: Vascular;  Laterality: N/A;   ANTERIOR AND POSTERIOR SPINAL FUSION N/A 08/02/2021   Procedure: ANTERIOR LUMBAR INTERBODY FUSION WITH POSTERIOR SPINAL FUSION, INSTRUMENTATION AND ILIAC CREST BONE GRAFT HARVEST LUMBAR FIVE THROUGH SACRAL ONE;  Surgeon: Melina Schools, MD;  Location: [place];  Service: Orthopedics;  Laterality: N/A;   BACK SURGERY     CARPAL TUNNEL RELEASE Right 12/08/2014   Procedure: RIGHT CARPAL TUNNEL RELEASE;  Surgeon: Iran Planas, MD;  Location: [place];  Service: Orthopedics;  Laterality: Right;   COLONOSCOPY WITH PROPOFOL N/A 04/24/2021   Procedure: COLONOSCOPY WITH PROPOFOL;  Surgeon: Harvel Quale, MD;  [facility];  Service: Gastroenterology;  Laterality: N/A;  7:30   CYSTOSCOPY Right 12/2015   stent placed and removed    CYSTOSCOPY W/ URETERAL STENT PLACEMENT Right 07/25/2016   Procedure: CYSTOSCOPY/RETROGRADE/URETEROSCOPY/STENT PLACEMENT;  Surgeon: Alexis Frock, MD;  Location: [facility];  Service: Urology;  Laterality: Right;   ESOPHAGOGASTRODUODENOSCOPY (EGD) WITH PROPOFOL N/A 04/24/2021   Procedure: ESOPHAGOGASTRODUODENOSCOPY (EGD) WITH PROPOFOL;  Surgeon: Harvel Quale, MD;  [facility];  Service: Gastroenterology;  Laterality: N/A;   HARVEST BONE GRAFT N/A 08/02/2021   Procedure: HARVEST ILIAC BONE GRAFT;  Surgeon:  Melina Schools, MD;  Location: [place];  Service: Orthopedics;  Laterality: N/A;   INCISION AND DRAINAGE Right 01/19/2016   Procedure: INCISION AND DRAINAGE RIGHT LONG FINGER;  Surgeon: Carole Civil, MD;  [facility];  Service: Orthopedics;  Laterality: Right;  RIGHT LONG FINGER - PT COMING AT 9:00 FOR LABWORK   POLYPECTOMY  04/24/2021   Procedure: POLYPECTOMY;  Surgeon: Harvel Quale, MD;  [facility];  Service: Gastroenterology;;   SHOULDER ARTHROSCOPY WITH OPEN ROTATOR CUFF REPAIR Right 07/03/2017   Procedure: SHOULDER ARTHROSCOPY;  Surgeon: Carole Civil, MD;  [facility];  Service: Orthopedics;  Laterality: Right;   SHOULDER OPEN ROTATOR CUFF REPAIR Right 07/03/2017   Procedure: ROTATOR CUFF REPAIR SHOULDER OPEN;  Surgeon: Carole Civil, MD;  Location[facility];  Service: Orthopedics;  Laterality: Right;   WRIST ARTHROSCOPY Right 12/08/2014   Procedure: RIGHT WRIST ARTHROSCOPY WITH DEBRIDEMENT/OR REPAIR;  Surgeon: Iran Planas, MD;  Location: [place];  Service: Orthopedics;  Laterality: Right;   Patient Active Problem List   Diagnosis Date Noted   S/P lumbar fusion 08/02/2021   Chronic hand pain 66/11/3014   Complication of surgical procedure 05/29/2021   Heartburn 04/09/2021   Chest pain 04/09/2021   Abscess of finger of right hand 05/10/2020   Bursitis of right shoulder 05/10/2020   Lumbar spondylosis 02/22/2020   Low back pain 12/10/2019   Sacroiliac inflammation ([place]) 10/29/2019   Body mass index (BMI) 27.0-27.9, adult 08/27/2019   Essential (primary) hypertension 08/05/2019   Smoker 08/06/2018   S/P right rotator cuff repair 07/03/17 07/08/2017   Male infertility 06/06/2017   Male  hypogonadism 06/03/2017   Severe obstructive sleep apnea 01/27/2017   Recurrent nephrolithiasis 07/31/2016   Ureteral stone with hydronephrosis 07/25/2016   History of kidney stones 06/16/2012    PCP: ***  REFERRING PROVIDER: ***  REFERRING DIAG:  ***  Rationale for Evaluation and Treatment {HABREHAB:27488}  THERAPY DIAG:  No diagnosis found.  ONSET DATE: ***  SUBJECTIVE:                                                                                                                                                                                           SUBJECTIVE STATEMENT: *** PERTINENT HISTORY:  ***  PAIN:  Are you having pain? Yes: {yespain:27235::"NPRS scale: ***/10","Pain location: ***","Pain description: ***","Aggravating factors: ***","Relieving factors: ***"}   PRECAUTIONS: {Therapy precautions:24002}  WEIGHT BEARING RESTRICTIONS {Yes ***/No:24003}  FALLS:  Has patient fallen in last 6 months? {fallsyesno:27318}  LIVING ENVIRONMENT: Lives with: {[facility] lives with:25569::"lives with their family"} Lives in: {Lives in:25570} Stairs: {opstairs:27293} Has following equipment at home: {Assistive devices:23999}  OCCUPATION: ***  PLOF: {PLOF:24004}  PATIENT GOALS ***   OBJECTIVE:   DIAGNOSTIC FINDINGS:  ***  PATIENT SURVEYS:  {rehab surveys:24030}  SCREENING FOR RED FLAGS: Bowel or bladder incontinence: {Yes/No:304960894} Spinal tumors: {Yes/No:304960894} Cauda equina syndrome: {Yes/No:304960894} Compression fracture: {Yes/No:304960894} Abdominal aneurysm: {Yes/No:304960894}  COGNITION:  Overall cognitive status: {cognition:24006}     SENSATION: {sensation:27233}  MUSCLE LENGTH: Hamstrings: Right *** deg; Left *** deg Thomas test: Right *** deg; Left *** deg  POSTURE: {posture:25561}  PALPATION: ***  LUMBAR ROM:   {AROM/PROM:27142}  A/PROM  eval  Flexion   Extension   Right lateral flexion   Left lateral flexion   Right rotation   Left rotation    (Blank rows = not tested)  LOWER EXTREMITY ROM:     {AROM/PROM:27142}  Right eval Left eval  Hip flexion    Hip extension    Hip abduction    Hip adduction    Hip internal rotation    Hip external rotation    Knee flexion     Knee extension    Ankle dorsiflexion    Ankle plantarflexion    Ankle inversion    Ankle eversion     (Blank rows = not tested)  LOWER EXTREMITY MMT:    MMT Right eval Left eval  Hip flexion    Hip extension    Hip abduction    Hip adduction    Hip internal rotation    Hip external rotation    Knee flexion    Knee extension    Ankle dorsiflexion    Ankle plantarflexion    Ankle inversion  Ankle eversion     (Blank rows = not tested)  LUMBAR SPECIAL TESTS:  {lumbar special test:25242}  FUNCTIONAL TESTS:  {Functional tests:24029}  GAIT: Distance walked: *** Assistive device utilized: {Assistive devices:23999} Level of assistance: {Levels of assistance:24026} Comments: ***    TODAY'S TREATMENT  ***   PATIENT EDUCATION:  Education details: *** Person educated: {Person educated:25204} Education method: {Education Method:25205} Education comprehension: {Education Comprehension:25206}   HOME EXERCISE PROGRAM: ***  ASSESSMENT:  CLINICAL IMPRESSION: Patient is a *** y.o. *** who was seen today for physical therapy evaluation and treatment for ***.    OBJECTIVE IMPAIRMENTS {opptimpairments:25111}.   ACTIVITY LIMITATIONS {activitylimitations:27494}  PARTICIPATION LIMITATIONS: {participationrestrictions:25113}  PERSONAL FACTORS {Personal factors:25162} are also affecting patient's functional outcome.   REHAB POTENTIAL: {rehabpotential:25112}  CLINICAL DECISION MAKING: {clinical decision making:25114}  EVALUATION COMPLEXITY: {Evaluation complexity:25115}   GOALS: Goals reviewed with patient? {yes/no:20286}  SHORT TERM GOALS: Target date: {follow up:25551}  *** Baseline: Goal status: {GOALSTATUS:25110}  2.  *** Baseline:  Goal status: {GOALSTATUS:25110}  3.  *** Baseline:  Goal status: {GOALSTATUS:25110}  4.  *** Baseline:  Goal status: {GOALSTATUS:25110}  5.  *** Baseline:  Goal status: {GOALSTATUS:25110}  6.  *** Baseline:   Goal status: {GOALSTATUS:25110}  LONG TERM GOALS: Target date: {follow up:25551}  *** Baseline:  Goal status: {GOALSTATUS:25110}  2.  *** Baseline:  Goal status: {GOALSTATUS:25110}  3.  *** Baseline:  Goal status: {GOALSTATUS:25110}  4.  *** Baseline:  Goal status: {GOALSTATUS:25110}  5.  *** Baseline:  Goal status: {GOALSTATUS:25110}  6.  *** Baseline:  Goal status: {GOALSTATUS:25110}   PLAN: PT FREQUENCY: {rehab frequency:25116}  PT DURATION: {rehab duration:25117}  PLANNED INTERVENTIONS: {rehab planned interventions:25118::"Therapeutic exercises","Therapeutic activity","Neuromuscular re-education","Balance training","Gait training","Patient/Family education","Joint mobilization"}.  PLAN FOR NEXT SESSION: ***   Ardis Rowan, PT 11/05/2021, 8:52 AM

## 2022-01-18 ENCOUNTER — Other Ambulatory Visit: Payer: Self-pay | Admitting: General Surgery

## 2022-01-18 DIAGNOSIS — K449 Diaphragmatic hernia without obstruction or gangrene: Secondary | ICD-10-CM

## 2022-01-19 IMAGING — MR MR LUMBAR SPINE W/O CM
4 of 5 series · 31 of 48 positions shown · non-contrast
Comparison: 09/24/2018

CLINICAL DATA: Back pain. Previous lumbar surgery May 2019.
Numbness and tingling down both legs.

EXAM:
MRI LUMBAR SPINE WITHOUT CONTRAST
TECHNIQUE: Multiplanar, multisequence MR imaging of the lumbar spine was
performed. No intravenous contrast was administered.

[Series 5: T1 · sagittal · 4.0mm · 0.75mm/px · 6 of 15 slices shown (1 of 2)]
[im 1/15]
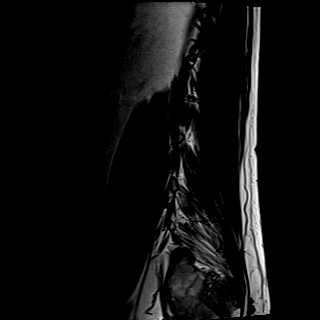
[im 3/15]
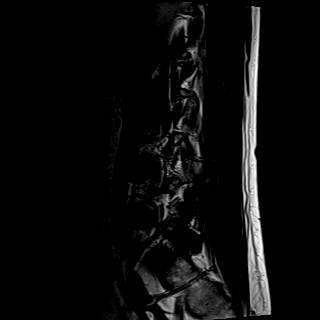
[im 6/15]
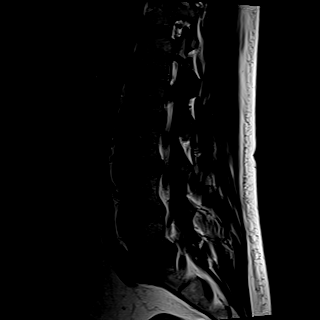
[im 9/15]
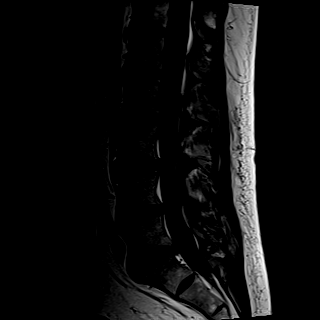
[im 12/15]
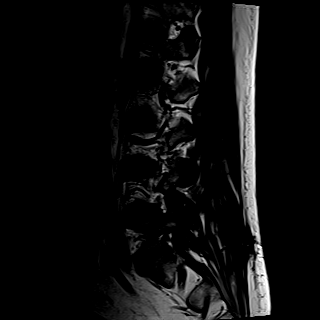
[im 15/15]
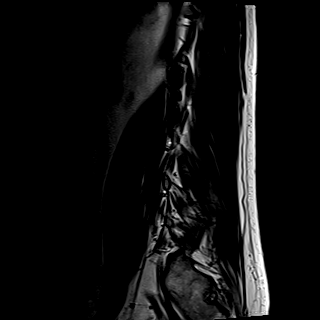

[Series 6: T2 · sagittal · 4.0mm · 0.75mm/px · 5 of 15 slices shown (1 of 2)]
[im 1/15]
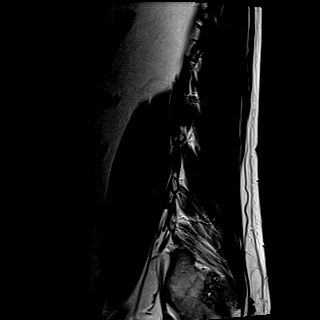
[im 4/15]
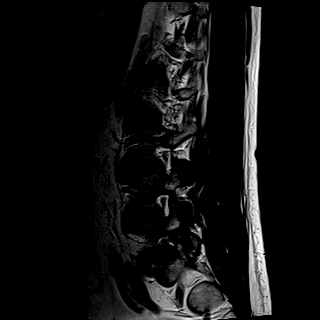
[im 8/15]
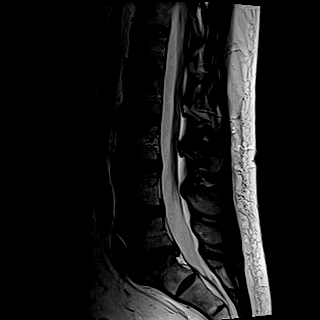
[im 11/15]
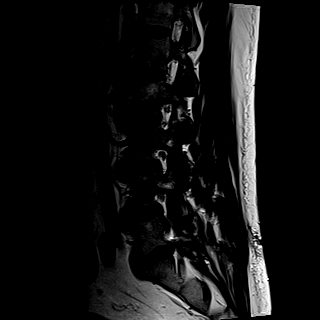
[im 15/15]
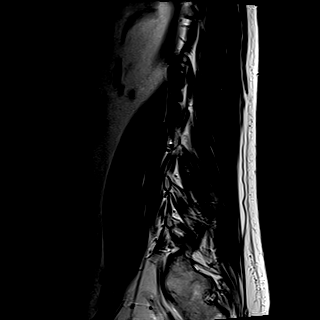

[Series 8: T2 · axial · 4.0mm · 0.62mm/px · z∈[-181,+54]mm · 10 of 43 slices shown (2 of 2)]
[im 3/43]
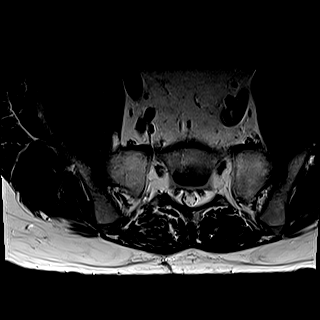
[im 6/43]
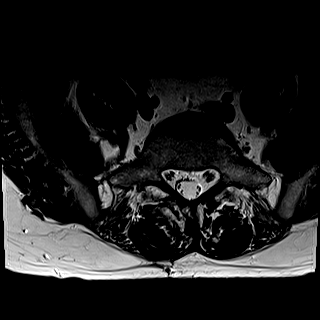
[im 9/43]
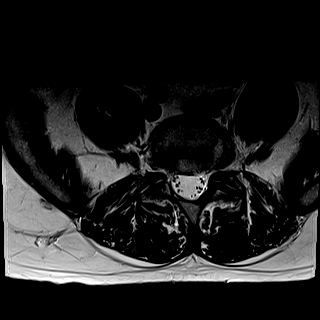
[im 15/43]
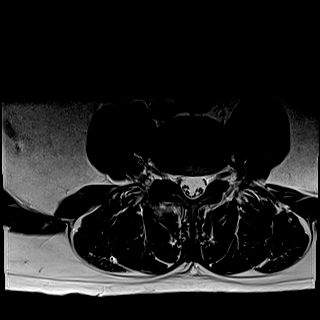
[im 20/43]
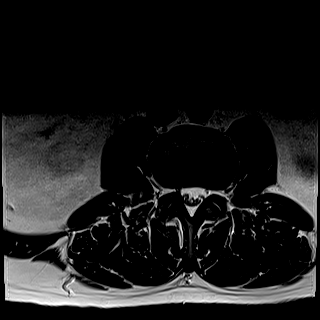
[im 23/43]
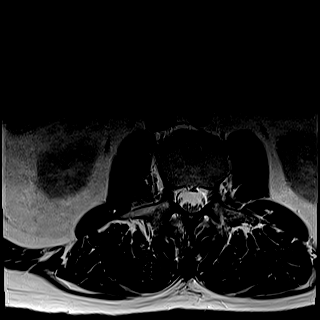
[im 26/43]
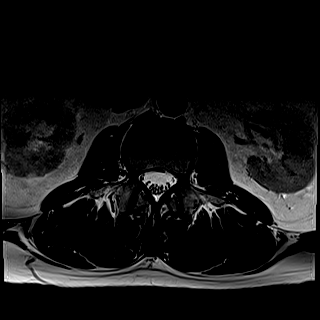
[im 31/43]
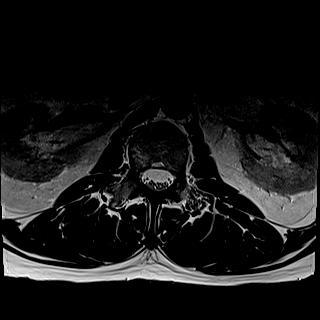
[im 37/43]
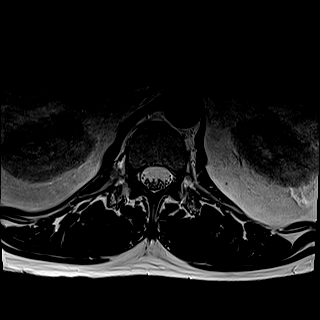
[im 43/43]
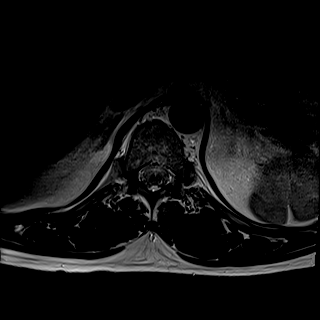

[Series 9: T1 · axial · 4.0mm · 0.43mm/px · z∈[-183,+56]mm · 10 of 43 slices shown (2 of 2)]
[im 3/43]
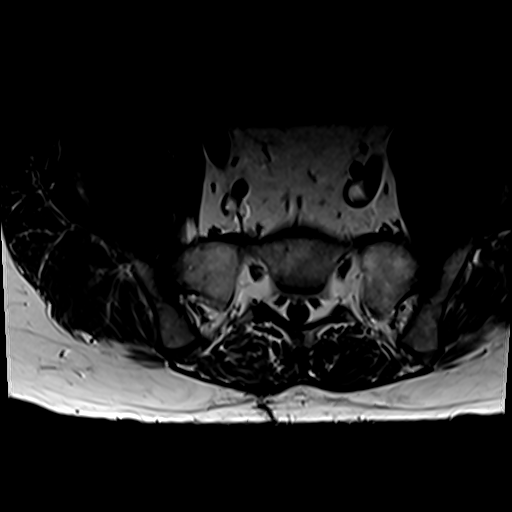
[im 6/43]
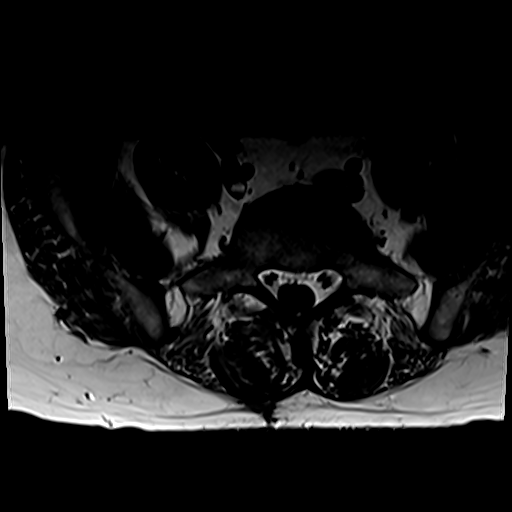
[im 9/43]
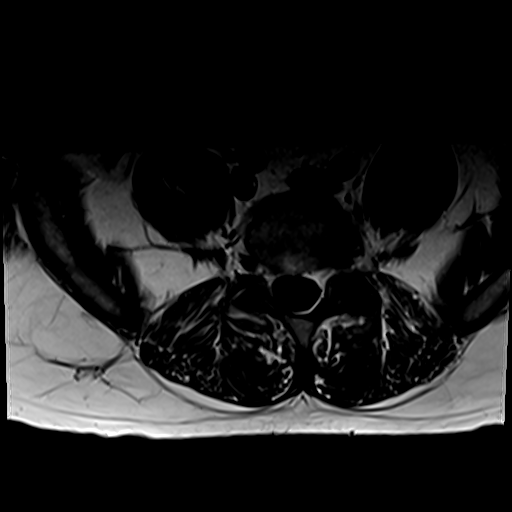
[im 15/43]
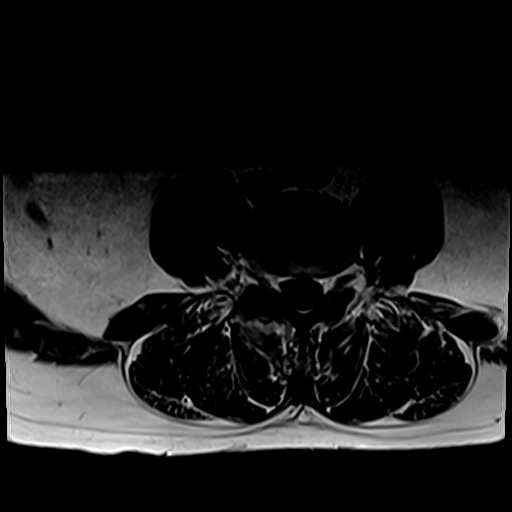
[im 20/43]
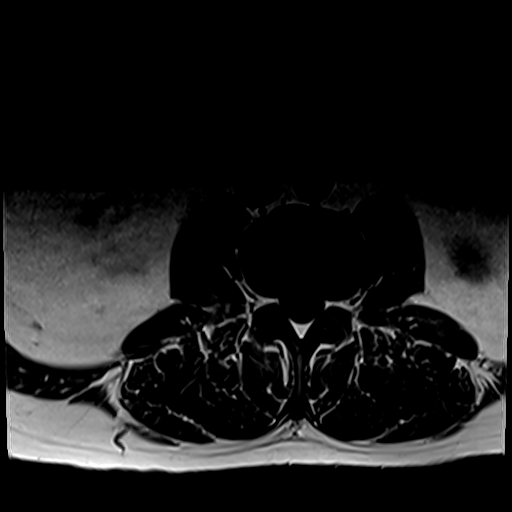
[im 23/43]
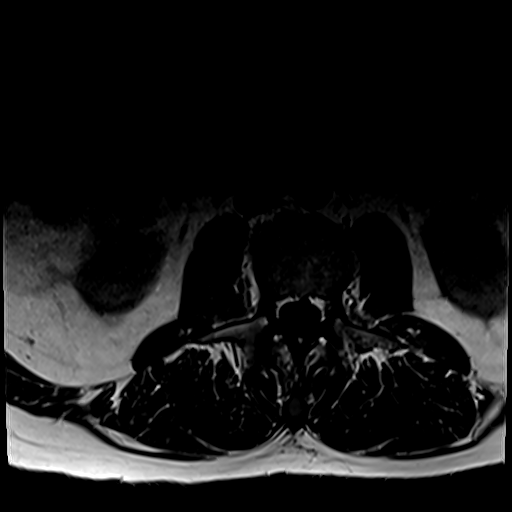
[im 26/43]
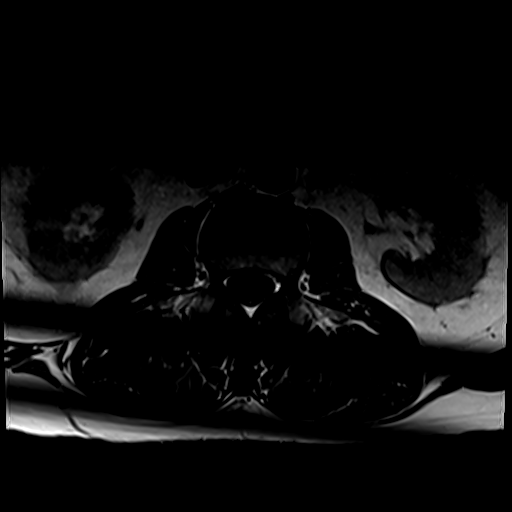
[im 31/43]
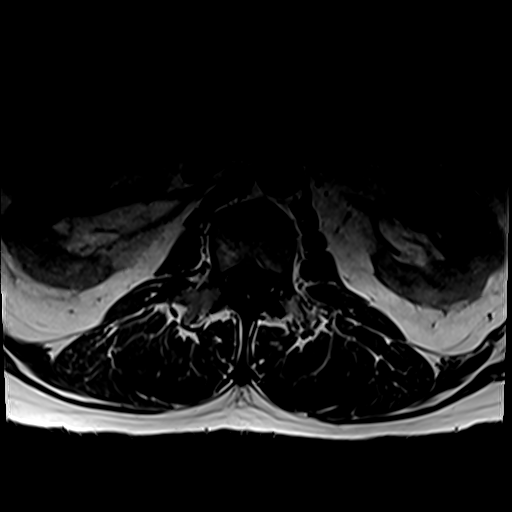
[im 37/43]
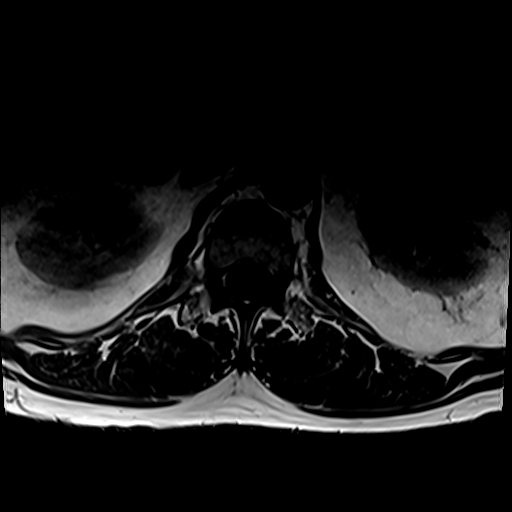
[im 43/43]
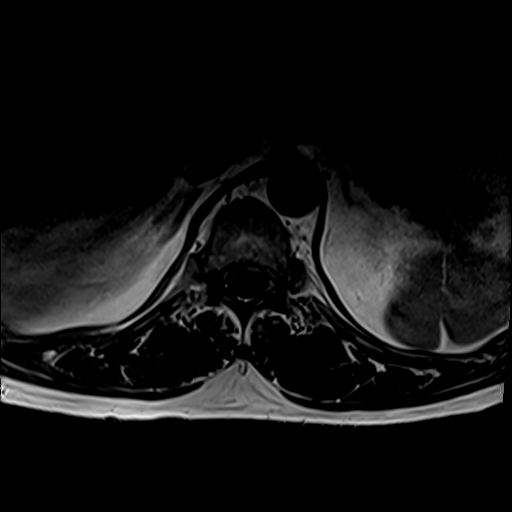

[31 of 48 positions shown; findings below may reference images not displayed]

FINDINGS: Segmentation:  5 lumbar type vertebral bodies.

Alignment:  Normal

Vertebrae:  No fracture or primary bone lesion.

Conus medullaris and cauda equina: Conus extends to the T12-L1
level. Conus and cauda equina appear normal.

Paraspinal and other soft tissues: Negative

Disc levels:

No abnormality at L2-3 or above.

L3-4: Desiccation and mild bulging of the disc. Facet degeneration
right more than left. No compressive stenosis. Similar appearance to
the previous study.

L4-5: Bulging of the disc. Bilateral facet osteoarthritis with
considerable edematous change. No compressive stenosis. The facet
arthropathy is likely to be associated with low back pain or
possibly referred facet syndrome pain.

L5-S1: Interval right hemilaminectomy and discectomy. No residual or
recurrent disc herniation. No compressive stenosis. Mild epidural
fibrosis.
IMPRESSION: Good appearance at the operative level of L5-S1. No evidence of
residual or recurrent disc herniation.

At L4-5, there is bulging of the disc and bilateral facet
osteoarthritis. The facet joints show considerable edema and are
likely painful. This could cause low back pain or referred facet
syndrome pain. This facet arthropathy as worsened since the
preoperative study.

L3-4: Disc bulge and lesser facet osteoarthritis. Findings at this
level could also contribute some degree to low back pain.

## 2022-02-06 ENCOUNTER — Other Ambulatory Visit: Payer: 59

## 2022-09-25 ENCOUNTER — Encounter: Payer: Self-pay | Admitting: Emergency Medicine

## 2022-09-25 ENCOUNTER — Ambulatory Visit
Admission: EM | Admit: 2022-09-25 | Discharge: 2022-09-25 | Disposition: A | Discharge status: Home / Self Care | Payer: 59 | Attending: Family Medicine | Admitting: Family Medicine

## 2022-09-25 DIAGNOSIS — J069 Acute upper respiratory infection, unspecified: Secondary | ICD-10-CM

## 2022-09-25 DIAGNOSIS — J029 Acute pharyngitis, unspecified: Secondary | ICD-10-CM

## 2022-09-25 MED ORDER — HYDROCODONE BIT-HOMATROP MBR 5-1.5 MG/5ML PO SOLN: 5.0000 mL | Freq: Four times a day (QID) | ORAL | 0 refills | Status: AC | PRN

## 2022-09-25 NOTE — Discharge Instructions (Signed)
Be aware, your cough medication may cause drowsiness. Please do not drive, operate heavy machinery or make important decisions while on this medication, it can cloud your judgement.  

## 2022-09-25 NOTE — ED Triage Notes (Signed)
Productive cough, sore throat, nasal congestion since Sunday.  Has been taking tylenol severe cold and robitussin without relief

## 2022-09-25 NOTE — ED Provider Notes (Signed)
[facility]   [id number] 09/25/22 Arrival Time: 4098  ASSESSMENT & PLAN:  1. Viral URI with cough   2. Sore throat    Discussed typical duration of likely viral illness. OTC symptom care as needed. Work note provided.  New Prescriptions   HYDROCODONE BIT-HOMATROPINE (HYCODAN) 5-1.5 MG/5ML SYRUP    Take 5 mLs by mouth every 6 (six) hours as needed for cough.     Follow-up Information     Benita Stabile, MD.   Specialty: Internal Medicine Why: As needed. Contact information: [address] [place] [facility] [address] [phone]                 Reviewed expectations re: course of current medical issues. Questions answered. Outlined signs and symptoms indicating need for more acute intervention. Understanding verbalized. After Visit Summary given.   SUBJECTIVE: History from: Patient. John Jones is a 52 y.o. male. Reports: cough, chest congestion, ST; abrupt onset; x 3-4 days. Denies: fever and difficulty breathing. Normal PO intake without n/v/d. Cough affecting sleep.  OBJECTIVE:  Vitals:   09/25/22 0926  BP: 136/81  Pulse: (!) 58  Resp: 18  Temp: 98.3 F (36.8 C)  TempSrc: Oral  SpO2: 98%    General appearance: alert; no distress Eyes: PERRLA; EOMI; conjunctiva normal HENT: [place]; AT; with nasal congestion Neck: supple  Lungs: speaks full sentences without difficulty; unlabored; CTAB Extremities: no edema Skin: warm and dry Neurologic: normal gait Psychological: alert and cooperative; normal mood and affect  Allergies  Allergen Reactions   Penicillins Other (See Comments)    Unknown-childhood Has patient had a PCN reaction causing immediate rash, facial/tongue/throat swelling, SOB or lightheadedness with hypotension: Unknown Has patient had a PCN reaction causing severe rash involving mucus membranes or skin necrosis: Unknown Has patient had a PCN reaction that required hospitalization: Unknown Has patient had a PCN reaction  occurring within the last 10 years: No If all of the above answers are "NO", then may proceed with Cephalosporin use.     Past Medical History:  Diagnosis Date   History of kidney stones    multiple episodes, passed stones   Hypertension    Migraine    Right ureteral stone    Sleep apnea    does not use cpap, lost weight 45lbs   Social History   Socioeconomic History   Marital status: Married    Spouse name: Not on file   Number of children: Not on file   Years of education: Not on file   Highest education level: Not on file  Occupational History   Not on file  Tobacco Use   Smoking status: Every Day    Packs/day: 0.50    Years: 8.00    Additional pack years: 0.00    Total pack years: 4.00    Types: Cigarettes   Smokeless tobacco: Never  Vaping Use   Vaping Use: Former   Quit date: 06/04/2019   Substances: Nicotine, Flavoring  Substance and Sexual Activity   Alcohol use: Yes    Comment: occasional beer   Drug use: No   Sexual activity: Yes    Birth control/protection: None  Other Topics Concern   Not on file  Social History Narrative   Not on file   Social Determinants of Health   Financial Resource Strain: Not on file  Food Insecurity: Not on file  Transportation Needs: Not on file  Physical Activity: Not on file  Stress: Not on file  Social  Connections: Not on file  Intimate Partner Violence: Not on file   Family History  Problem Relation Age of Onset   Lung cancer Mother    Arthritis Mother    Heart disease Father    Emphysema Father    Bone cancer Father    Past Surgical History:  Procedure Laterality Date   ABDOMINAL EXPOSURE N/A 08/02/2021   Procedure: ABDOMINAL EXPOSURE FOR ANTERIOR LUMBAR SPINE SURGERY;  Surgeon: Cephus Shelling, MD;  Location: [facility];  Service: Vascular;  Laterality: N/A;   ANTERIOR AND POSTERIOR SPINAL FUSION N/A 08/02/2021   Procedure: ANTERIOR LUMBAR INTERBODY FUSION WITH POSTERIOR SPINAL FUSION, INSTRUMENTATION AND  ILIAC CREST BONE GRAFT HARVEST LUMBAR FIVE THROUGH SACRAL ONE;  Surgeon: Venita Lick, MD;  Location[facility];  Service: Orthopedics;  Laterality: N/A;   BACK SURGERY     CARPAL TUNNEL RELEASE Right 12/08/2014   Procedure: RIGHT CARPAL TUNNEL RELEASE;  Surgeon: Bradly Bienenstock, MD;  Location: [facility];  Service: Orthopedics;  Laterality: Right;   COLONOSCOPY WITH PROPOFOL N/A 04/24/2021   Procedure: COLONOSCOPY WITH PROPOFOL;  Surgeon: Dolores Frame, MD;  [facility];  Service: Gastroenterology;  Laterality: N/A;  7:30   CYSTOSCOPY Right 12/2015   stent placed and removed    CYSTOSCOPY W/ URETERAL STENT PLACEMENT Right 07/25/2016   Procedure: CYSTOSCOPY/RETROGRADE/URETEROSCOPY/STENT PLACEMENT;  Surgeon: Sebastian Ache, MD;  Location: [facility];  Service: Urology;  Laterality: Right;   ESOPHAGOGASTRODUODENOSCOPY (EGD) WITH PROPOFOL N/A 04/24/2021   Procedure: ESOPHAGOGASTRODUODENOSCOPY (EGD) WITH PROPOFOL;  Surgeon: Dolores Frame, MD;  [facility];  Service: Gastroenterology;  Laterality: N/A;   HARVEST BONE GRAFT N/A 08/02/2021   Procedure: HARVEST ILIAC BONE GRAFT;  Surgeon: Venita Lick, MD;  [facility];  Service: Orthopedics;  Laterality: N/A;   INCISION AND DRAINAGE Right 01/19/2016   Procedure: INCISION AND DRAINAGE RIGHT LONG FINGER;  Surgeon: Vickki Hearing, MD;  [facility];  Service: Orthopedics;  Laterality: Right;  RIGHT LONG FINGER - PT COMING AT 9:00 FOR LABWORK   POLYPECTOMY  04/24/2021   Procedure: POLYPECTOMY;  Surgeon: Dolores Frame, MD;  [facility];  Service: Gastroenterology;;   SHOULDER ARTHROSCOPY WITH OPEN ROTATOR CUFF REPAIR Right 07/03/2017   Procedure: SHOULDER ARTHROSCOPY;  Surgeon: Vickki Hearing, MD;  [facility];  Service: Orthopedics;  Laterality: Right;   SHOULDER OPEN ROTATOR CUFF REPAIR Right 07/03/2017   Procedure: ROTATOR CUFF REPAIR SHOULDER OPEN;   Surgeon: Vickki Hearing, MD;  [facility];  Service: Orthopedics;  Laterality: Right;   WRIST ARTHROSCOPY Right 12/08/2014   Procedure: RIGHT WRIST ARTHROSCOPY WITH DEBRIDEMENT/OR REPAIR;  Surgeon: Bradly Bienenstock, MD;  Location[facility];  Service: Orthopedics;  Laterality: Right;     Mardella Layman, MD 09/25/22 [phone]

## 2022-10-28 ENCOUNTER — Other Ambulatory Visit: Payer: Self-pay

## 2022-10-28 ENCOUNTER — Emergency Department (HOSPITAL_COMMUNITY)
Admission: EM | Admit: 2022-10-28 | Discharge: 2022-10-28 | Disposition: A | Discharge status: Home / Self Care | Payer: 59 | Attending: Emergency Medicine | Admitting: Emergency Medicine

## 2022-10-28 ENCOUNTER — Encounter (HOSPITAL_COMMUNITY): Payer: Self-pay | Admitting: Emergency Medicine

## 2022-10-28 DIAGNOSIS — Z79899 Other long term (current) drug therapy: Secondary | ICD-10-CM | POA: Diagnosis not present

## 2022-10-28 DIAGNOSIS — I10 Essential (primary) hypertension: Secondary | ICD-10-CM | POA: Diagnosis not present

## 2022-10-28 DIAGNOSIS — L509 Urticaria, unspecified: Secondary | ICD-10-CM | POA: Diagnosis not present

## 2022-10-28 DIAGNOSIS — R21 Rash and other nonspecific skin eruption: Secondary | ICD-10-CM | POA: Diagnosis present

## 2022-10-28 MED ORDER — DEXAMETHASONE SODIUM PHOSPHATE 10 MG/ML IJ SOLN
10.0000 mg | Freq: Once | INTRAMUSCULAR | Status: AC
  Administered 2022-10-28: 10 mg via INTRAVENOUS
  Filled 2022-10-28: qty 1

## 2022-10-28 MED ORDER — PREDNISONE 10 MG PO TABS: ORAL_TABLET | ORAL | 0 refills | Status: AC

## 2022-10-28 MED ORDER — HYDROXYZINE HCL 25 MG PO TABS: 25.0000 mg | ORAL_TABLET | Freq: Four times a day (QID) | ORAL | 0 refills | Status: AC | PRN

## 2022-10-28 MED ORDER — FAMOTIDINE 20 MG PO TABS: 20.0000 mg | ORAL_TABLET | Freq: Two times a day (BID) | ORAL | 0 refills | Status: AC

## 2022-10-28 MED ORDER — HYDROXYZINE HCL 25 MG PO TABS
50.0000 mg | ORAL_TABLET | Freq: Once | ORAL | Status: AC
  Administered 2022-10-28: 50 mg via ORAL
  Filled 2022-10-28: qty 2

## 2022-10-28 MED ORDER — FAMOTIDINE IN NACL 20-0.9 MG/50ML-% IV SOLN
20.0000 mg | Freq: Once | INTRAVENOUS | Status: AC
  Administered 2022-10-28: 20 mg via INTRAVENOUS
  Filled 2022-10-28: qty 50

## 2022-10-28 NOTE — ED Provider Notes (Signed)
[place] EMERGENCY DEPARTMENT AT [facility] Provider Note   CSN: [id number] Arrival date & time: 10/28/22  1600     History  Chief Complaint  Patient presents with   Rash    KEYONTA LIVOLSI is a 52 y.o. male with a history including migraine headaches, hypertension, sleep apnea, allergic to penicillin only presenting with a 1 day history of hives.  He woke up with hives which started on his lower abdomen, and has since spread to involve both dorsal hands, the back of his neck and lesser so on the top of his feet.  There have been waxing and waning individual hive clusters, he has taken Benadryl 25 mg no less than 6 tablets since he first woke this morning with no improvement in his itching.  He denies shortness of breath, cough, wheezing, no fevers or chills.  He has had no known exposures to any new medications, food, topical creams or lotions, no change in detergents.  He spent yesterday outdoor grilling, no yard work, no chemical exposures.   The history is provided by the patient.       Home Medications Prior to Admission medications   Medication Sig Start Date End Date Taking? Authorizing Provider  famotidine (PEPCID) 20 MG tablet Take 1 tablet (20 mg total) by mouth 2 (two) times daily. 10/28/22  Yes Shamyia Grandpre, Raynelle Fanning, PA-C  hydrOXYzine (ATARAX) 25 MG tablet Take 1 tablet (25 mg total) by mouth every 6 (six) hours as needed for itching. 10/28/22  Yes Adil Tugwell, Raynelle Fanning, PA-C  predniSONE (DELTASONE) 10 MG tablet 6, 5, 4, 3, 2 then 1 tablet by mouth daily for 6 days total. 10/28/22  Yes Lyrica Mcclarty, Raynelle Fanning, PA-C  HYDROcodone bit-homatropine (HYCODAN) 5-1.5 MG/5ML syrup Take 5 mLs by mouth every 6 (six) hours as needed for cough. 09/25/22   Mardella Layman, MD      Allergies    Penicillins    Review of Systems   Review of Systems  Constitutional:  Negative for chills and fever.  Respiratory:  Negative for shortness of breath and wheezing.   Skin:  Positive for rash.  Neurological:   Negative for numbness.  All other systems reviewed and are negative.   Physical Exam Updated Vital Signs BP 120/77   Pulse 90   Temp 98 F (36.7 C) (Oral)   Resp 19   Ht 5\' 4"  (1.626 m)   Wt 70.3 kg   SpO2 97%   BMI 26.61 kg/m  Physical Exam Constitutional:      General: He is not in acute distress.    Appearance: He is well-developed.  HENT:     Head: Normocephalic.  Cardiovascular:     Rate and Rhythm: Normal rate.  Pulmonary:     Effort: Pulmonary effort is normal.     Breath sounds: No stridor. No wheezing or rhonchi.  Musculoskeletal:        General: Normal range of motion.     Cervical back: Neck supple.  Skin:    Findings: Rash present. Rash is urticarial.     Comments: Urticaria present on his posterior neck, near confluent along his waistline, less obvious lesions on his dorsal wrists and feet.     ED Results / Procedures / Treatments   Labs (all labs ordered are listed, but only abnormal results are displayed) Labs Reviewed - No data to display  EKG None  Radiology No results found.  Procedures Procedures    Medications Ordered in ED Medications  dexamethasone (DECADRON)  injection 10 mg (10 mg Intravenous Given 10/28/22 1749)  famotidine (PEPCID) IVPB 20 mg premix (0 mg Intravenous Stopped 10/28/22 1820)  hydrOXYzine (ATARAX) tablet 50 mg (50 mg Oral Given 10/28/22 1744)    ED Course/ Medical Decision Making/ A&P                             Medical Decision Making Patient is presenting with urticaria of unclear etiology, he denies any new exposures to foods, environmental or topical exposures.  Clear urticaria on exam without any respiratory or GI complaints.  He was given IV Decadron and Pepcid, oral Atarax after which he felt greatly improved.  He will be prescribed additional prednisone and Pepcid, Atarax in place of the Benadryl he had taken prior to arrival which was not improving this pruritus.  Strict return precautions were outlined.  He  was stable at time of discharge.  Risk Prescription drug management.           Final Clinical Impression(s) / ED Diagnoses Final diagnoses:  Urticaria    Rx / DC Orders ED Discharge Orders          Ordered    famotidine (PEPCID) 20 MG tablet  2 times daily        10/28/22 1907    predniSONE (DELTASONE) 10 MG tablet        10/28/22 1907    hydrOXYzine (ATARAX) 25 MG tablet  Every 6 hours PRN        10/28/22 1907              Victoriano Lain 10/28/22 1911    Terrilee Files, MD 10/29/22 [phone]

## 2022-10-28 NOTE — Discharge Instructions (Addendum)
Take your next dose of prednisone tomorrow evening with your supper.  You may take 1 more dose of Atarax before bedtime if needed for itching.  I do recommend continuing Pepcid twice a day for the next 7 days.  Do not take additional Benadryl, Atarax takes the place of Benadryl.  Get rechecked for any worsening symptoms including worse rash, shortness of breath, mouth tongue or throat swelling, these are symptoms of worsening allergic reaction.  It is not clear what you are reacting to, if this returns or becomes a pattern you may need allergy testing to help sort out what you are reacting to.

## 2022-10-28 NOTE — ED Triage Notes (Signed)
Pt c/o generalized hives since this morning, starting on abdomen and since spreading to bilateral arms, hands, etc. He says he has taken (6) or (7) 25mg  benadryl capsules today with no improvement in itching. Counseling provided regarding medication safety. NAD in triage.

## 2023-08-02 DEATH — deceased
